# Patient Record
Sex: Male | Born: 1935 | Race: White | Hispanic: No | Smoking: Former smoker
Health system: Southern US, Community
[De-identification: ages and names within clinical notes are randomized; demographics above are authoritative.]

## PROBLEM LIST (undated history)

## (undated) DIAGNOSIS — K5730 Diverticulosis of large intestine without perforation or abscess without bleeding: Secondary | ICD-10-CM

## (undated) DIAGNOSIS — I10 Essential (primary) hypertension: Secondary | ICD-10-CM

## (undated) DIAGNOSIS — I2510 Atherosclerotic heart disease of native coronary artery without angina pectoris: Secondary | ICD-10-CM

## (undated) DIAGNOSIS — J189 Pneumonia, unspecified organism: Secondary | ICD-10-CM

## (undated) DIAGNOSIS — K529 Noninfective gastroenteritis and colitis, unspecified: Secondary | ICD-10-CM

## (undated) DIAGNOSIS — I350 Nonrheumatic aortic (valve) stenosis: Secondary | ICD-10-CM

## (undated) DIAGNOSIS — N289 Disorder of kidney and ureter, unspecified: Secondary | ICD-10-CM

## (undated) DIAGNOSIS — I509 Heart failure, unspecified: Secondary | ICD-10-CM

## (undated) DIAGNOSIS — I214 Non-ST elevation (NSTEMI) myocardial infarction: Secondary | ICD-10-CM

## (undated) DIAGNOSIS — I739 Peripheral vascular disease, unspecified: Secondary | ICD-10-CM

## (undated) DIAGNOSIS — I639 Cerebral infarction, unspecified: Secondary | ICD-10-CM

## (undated) DIAGNOSIS — N400 Enlarged prostate without lower urinary tract symptoms: Secondary | ICD-10-CM

## (undated) DIAGNOSIS — I6529 Occlusion and stenosis of unspecified carotid artery: Secondary | ICD-10-CM

## (undated) HISTORY — DX: Noninfective gastroenteritis and colitis, unspecified: K52.9

## (undated) HISTORY — PX: KNEE ARTHROSCOPY: SUR90

## (undated) HISTORY — PX: ELBOW SURGERY: SHX618

## (undated) HISTORY — DX: Diverticulosis of large intestine without perforation or abscess without bleeding: K57.30

## (undated) HISTORY — PX: APPENDECTOMY: SHX54

## (undated) HISTORY — PX: CATARACT EXTRACTION: SUR2

## (undated) HISTORY — DX: Pneumonia, unspecified organism: J18.9

## (undated) HISTORY — PX: HEMORRHOID SURGERY: SHX153

---

## 2001-07-15 ENCOUNTER — Encounter: Payer: Self-pay | Admitting: Emergency Medicine

## 2001-07-15 ENCOUNTER — Emergency Department (HOSPITAL_COMMUNITY): Admission: EM | Admit: 2001-07-15 | Discharge: 2001-07-15 | Payer: Self-pay | Admitting: Emergency Medicine

## 2001-10-18 ENCOUNTER — Emergency Department (HOSPITAL_COMMUNITY): Admission: EM | Admit: 2001-10-18 | Discharge: 2001-10-19 | Payer: Self-pay | Admitting: Pathology

## 2002-01-11 DIAGNOSIS — I2510 Atherosclerotic heart disease of native coronary artery without angina pectoris: Secondary | ICD-10-CM

## 2002-01-11 HISTORY — PX: CORONARY ARTERY BYPASS GRAFT: SHX141

## 2002-01-11 HISTORY — DX: Atherosclerotic heart disease of native coronary artery without angina pectoris: I25.10

## 2002-10-02 ENCOUNTER — Inpatient Hospital Stay (HOSPITAL_COMMUNITY): Admission: AD | Admit: 2002-10-02 | Discharge: 2002-10-10 | Payer: Self-pay | Admitting: *Deleted

## 2002-10-02 ENCOUNTER — Encounter: Payer: Self-pay | Admitting: *Deleted

## 2002-10-05 ENCOUNTER — Encounter: Payer: Self-pay | Admitting: Thoracic Surgery (Cardiothoracic Vascular Surgery)

## 2002-10-06 ENCOUNTER — Encounter: Payer: Self-pay | Admitting: Thoracic Surgery (Cardiothoracic Vascular Surgery)

## 2002-10-07 ENCOUNTER — Encounter: Payer: Self-pay | Admitting: Cardiothoracic Surgery

## 2002-10-08 ENCOUNTER — Encounter: Payer: Self-pay | Admitting: Cardiothoracic Surgery

## 2003-02-12 ENCOUNTER — Encounter: Admission: RE | Admit: 2003-02-12 | Discharge: 2003-02-12 | Payer: Self-pay | Admitting: Cardiology

## 2003-03-21 ENCOUNTER — Inpatient Hospital Stay (HOSPITAL_BASED_OUTPATIENT_CLINIC_OR_DEPARTMENT_OTHER): Admission: RE | Admit: 2003-03-21 | Discharge: 2003-03-21 | Payer: Self-pay | Admitting: Cardiology

## 2003-04-24 ENCOUNTER — Ambulatory Visit (HOSPITAL_COMMUNITY): Admission: RE | Admit: 2003-04-24 | Discharge: 2003-04-25 | Payer: Self-pay | Admitting: Cardiology

## 2003-07-26 ENCOUNTER — Ambulatory Visit (HOSPITAL_COMMUNITY): Admission: RE | Admit: 2003-07-26 | Discharge: 2003-07-27 | Payer: Self-pay | Admitting: Cardiology

## 2003-11-27 ENCOUNTER — Ambulatory Visit: Payer: Self-pay | Admitting: Internal Medicine

## 2003-12-03 ENCOUNTER — Ambulatory Visit: Payer: Self-pay | Admitting: Internal Medicine

## 2004-03-09 ENCOUNTER — Ambulatory Visit: Payer: Self-pay | Admitting: Internal Medicine

## 2004-03-20 ENCOUNTER — Ambulatory Visit: Payer: Self-pay | Admitting: Internal Medicine

## 2004-03-24 ENCOUNTER — Ambulatory Visit: Payer: Self-pay | Admitting: Cardiology

## 2004-06-29 ENCOUNTER — Ambulatory Visit: Payer: Self-pay | Admitting: Internal Medicine

## 2004-06-30 ENCOUNTER — Ambulatory Visit: Payer: Self-pay | Admitting: Internal Medicine

## 2004-07-06 ENCOUNTER — Emergency Department (HOSPITAL_COMMUNITY): Admission: EM | Admit: 2004-07-06 | Discharge: 2004-07-06 | Payer: Self-pay | Admitting: Emergency Medicine

## 2004-07-06 ENCOUNTER — Inpatient Hospital Stay (HOSPITAL_COMMUNITY): Admission: AD | Admit: 2004-07-06 | Discharge: 2004-07-09 | Payer: Self-pay | Admitting: Neurology

## 2004-07-07 ENCOUNTER — Encounter (INDEPENDENT_AMBULATORY_CARE_PROVIDER_SITE_OTHER): Payer: Self-pay | Admitting: Cardiology

## 2004-08-11 ENCOUNTER — Encounter (INDEPENDENT_AMBULATORY_CARE_PROVIDER_SITE_OTHER): Payer: Self-pay | Admitting: *Deleted

## 2004-08-11 ENCOUNTER — Inpatient Hospital Stay (HOSPITAL_COMMUNITY): Admission: RE | Admit: 2004-08-11 | Discharge: 2004-08-12 | Payer: Self-pay | Admitting: *Deleted

## 2004-08-11 HISTORY — PX: CAROTID ENDARTERECTOMY: SUR193

## 2004-09-21 ENCOUNTER — Emergency Department (HOSPITAL_COMMUNITY): Admission: EM | Admit: 2004-09-21 | Discharge: 2004-09-21 | Payer: Self-pay | Admitting: Emergency Medicine

## 2004-10-08 ENCOUNTER — Inpatient Hospital Stay (HOSPITAL_COMMUNITY): Admission: RE | Admit: 2004-10-08 | Discharge: 2004-10-10 | Payer: Self-pay | Admitting: *Deleted

## 2004-10-08 ENCOUNTER — Encounter (INDEPENDENT_AMBULATORY_CARE_PROVIDER_SITE_OTHER): Payer: Self-pay | Admitting: Specialist

## 2004-10-08 HISTORY — PX: CAROTID ENDARTERECTOMY: SUR193

## 2004-10-12 ENCOUNTER — Ambulatory Visit: Payer: Self-pay | Admitting: Cardiology

## 2004-10-29 ENCOUNTER — Ambulatory Visit: Payer: Self-pay | Admitting: Internal Medicine

## 2004-11-13 ENCOUNTER — Ambulatory Visit: Payer: Self-pay | Admitting: Internal Medicine

## 2004-11-17 ENCOUNTER — Encounter: Admission: RE | Admit: 2004-11-17 | Discharge: 2004-12-09 | Payer: Self-pay | Admitting: Internal Medicine

## 2004-11-23 ENCOUNTER — Ambulatory Visit: Payer: Self-pay | Admitting: Internal Medicine

## 2004-12-16 ENCOUNTER — Ambulatory Visit: Payer: Self-pay | Admitting: Internal Medicine

## 2004-12-25 ENCOUNTER — Ambulatory Visit: Payer: Self-pay | Admitting: Internal Medicine

## 2005-01-29 ENCOUNTER — Ambulatory Visit: Payer: Self-pay | Admitting: Internal Medicine

## 2005-04-12 ENCOUNTER — Ambulatory Visit: Payer: Self-pay | Admitting: Cardiology

## 2005-04-30 ENCOUNTER — Ambulatory Visit: Payer: Self-pay | Admitting: Internal Medicine

## 2005-07-30 ENCOUNTER — Ambulatory Visit: Payer: Self-pay | Admitting: Internal Medicine

## 2005-10-14 ENCOUNTER — Ambulatory Visit: Payer: Self-pay | Admitting: Cardiology

## 2005-11-10 ENCOUNTER — Ambulatory Visit: Payer: Self-pay | Admitting: Internal Medicine

## 2005-11-10 LAB — CONVERTED CEMR LAB
ALT: 25 units/L (ref 0–40)
AST: 27 units/L (ref 0–37)
Albumin: 3.8 g/dL (ref 3.5–5.2)
Alkaline Phosphatase: 76 units/L (ref 39–117)
BUN: 30 mg/dL — ABNORMAL HIGH (ref 6–23)
Bilirubin, Direct: 0.1 mg/dL (ref 0.0–0.3)
CO2: 28 meq/L (ref 19–32)
Calcium: 9.1 mg/dL (ref 8.4–10.5)
Chloride: 106 meq/L (ref 96–112)
Chol/HDL Ratio, serum: 4.4
Cholesterol: 128 mg/dL (ref 0–200)
Creatinine, Ser: 1.7 mg/dL — ABNORMAL HIGH (ref 0.4–1.5)
GFR calc non Af Amer: 42 mL/min
Glomerular Filtration Rate, Af Am: 50 mL/min/{1.73_m2}
Glucose, Bld: 101 mg/dL — ABNORMAL HIGH (ref 70–99)
HDL: 29.1 mg/dL — ABNORMAL LOW (ref >39.0)
LDL Cholesterol: 84 mg/dL (ref 0–99)
Potassium: 4 meq/L (ref 3.5–5.1)
Sodium: 141 meq/L (ref 135–145)
Total Bilirubin: 0.9 mg/dL (ref 0.3–1.2)
Total Protein: 6.7 g/dL (ref 6.0–8.3)
Triglyceride fasting, serum: 73 mg/dL (ref 0–149)
VLDL: 15 mg/dL (ref 0–40)

## 2005-11-17 ENCOUNTER — Ambulatory Visit: Payer: Self-pay | Admitting: Internal Medicine

## 2006-02-16 ENCOUNTER — Ambulatory Visit: Payer: Self-pay | Admitting: Internal Medicine

## 2006-02-16 LAB — CONVERTED CEMR LAB
ALT: 17 U/L
AST: 22 U/L
Albumin: 3.8 g/dL
Alkaline Phosphatase: 65 U/L
Bilirubin, Direct: 0.2 mg/dL
Total Bilirubin: 0.6 mg/dL
Total Protein: 6.6 g/dL

## 2006-06-15 ENCOUNTER — Ambulatory Visit: Payer: Self-pay | Admitting: Internal Medicine

## 2006-06-15 LAB — CONVERTED CEMR LAB
ALT: 23 units/L (ref 0–40)
AST: 23 units/L (ref 0–37)
Albumin: 4.1 g/dL (ref 3.5–5.2)
Alkaline Phosphatase: 76 units/L (ref 39–117)
BUN: 35 mg/dL — ABNORMAL HIGH (ref 6–23)
Bilirubin, Direct: 0.3 mg/dL (ref 0.0–0.3)
CO2: 28 meq/L (ref 19–32)
Calcium: 9.2 mg/dL (ref 8.4–10.5)
Chloride: 107 meq/L (ref 96–112)
Cholesterol: 129 mg/dL (ref 0–200)
Creatinine, Ser: 2 mg/dL — ABNORMAL HIGH (ref 0.4–1.5)
GFR calc Af Amer: 42 mL/min
GFR calc non Af Amer: 34 mL/min
Glucose, Bld: 83 mg/dL (ref 70–99)
HDL: 30.2 mg/dL — ABNORMAL LOW (ref >39.0)
LDL Cholesterol: 75 mg/dL (ref 0–99)
Potassium: 3.9 meq/L (ref 3.5–5.1)
Sodium: 142 meq/L (ref 135–145)
Total Bilirubin: 1.5 mg/dL — ABNORMAL HIGH (ref 0.3–1.2)
Total CHOL/HDL Ratio: 4.3
Total Protein: 7.4 g/dL (ref 6.0–8.3)
Triglycerides: 120 mg/dL (ref 0–149)
VLDL: 24 mg/dL (ref 0–40)

## 2006-06-17 DIAGNOSIS — K5730 Diverticulosis of large intestine without perforation or abscess without bleeding: Secondary | ICD-10-CM | POA: Insufficient documentation

## 2006-06-17 DIAGNOSIS — I6529 Occlusion and stenosis of unspecified carotid artery: Secondary | ICD-10-CM

## 2006-06-17 DIAGNOSIS — E785 Hyperlipidemia, unspecified: Secondary | ICD-10-CM | POA: Insufficient documentation

## 2006-06-17 DIAGNOSIS — I10 Essential (primary) hypertension: Secondary | ICD-10-CM | POA: Insufficient documentation

## 2006-06-17 HISTORY — DX: Diverticulosis of large intestine without perforation or abscess without bleeding: K57.30

## 2006-06-22 ENCOUNTER — Ambulatory Visit: Payer: Self-pay | Admitting: Cardiology

## 2006-07-20 ENCOUNTER — Ambulatory Visit: Payer: Self-pay

## 2006-07-20 ENCOUNTER — Ambulatory Visit: Payer: Self-pay | Admitting: Cardiology

## 2006-07-20 LAB — CONVERTED CEMR LAB
CO2: 25 meq/L (ref 19–32)
GFR calc Af Amer: 58 mL/min
GFR calc non Af Amer: 48 mL/min
Potassium: 4 meq/L (ref 3.5–5.1)

## 2006-08-09 ENCOUNTER — Telehealth: Payer: Self-pay | Admitting: Internal Medicine

## 2006-09-02 ENCOUNTER — Encounter: Payer: Self-pay | Admitting: Internal Medicine

## 2006-10-03 ENCOUNTER — Ambulatory Visit: Payer: Self-pay | Admitting: Internal Medicine

## 2006-11-08 ENCOUNTER — Encounter: Payer: Self-pay | Admitting: Internal Medicine

## 2006-11-10 ENCOUNTER — Encounter: Payer: Self-pay | Admitting: Internal Medicine

## 2006-11-11 ENCOUNTER — Ambulatory Visit: Payer: Self-pay | Admitting: Internal Medicine

## 2006-11-14 ENCOUNTER — Telehealth: Payer: Self-pay | Admitting: Internal Medicine

## 2006-12-06 ENCOUNTER — Encounter: Payer: Self-pay | Admitting: Internal Medicine

## 2006-12-07 ENCOUNTER — Ambulatory Visit: Payer: Self-pay | Admitting: Internal Medicine

## 2006-12-07 LAB — CONVERTED CEMR LAB
ALT: 24 units/L (ref 0–53)
AST: 19 units/L (ref 0–37)
Albumin: 4 g/dL (ref 3.5–5.2)
Alkaline Phosphatase: 70 units/L (ref 39–117)
BUN: 34 mg/dL — ABNORMAL HIGH (ref 6–23)
Basophils Absolute: 0.1 10*3/uL (ref 0.0–0.1)
Basophils Relative: 1.3 % — ABNORMAL HIGH (ref 0.0–1.0)
Bilirubin, Direct: 0.2 mg/dL (ref 0.0–0.3)
CO2: 25 meq/L (ref 19–32)
Calcium: 9.2 mg/dL (ref 8.4–10.5)
Chloride: 106 meq/L (ref 96–112)
Cholesterol: 119 mg/dL (ref 0–200)
Creatinine, Ser: 2.2 mg/dL — ABNORMAL HIGH (ref 0.4–1.5)
Eosinophils Absolute: 0.4 10*3/uL (ref 0.0–0.6)
Eosinophils Relative: 5.6 % — ABNORMAL HIGH (ref 0.0–5.0)
GFR calc Af Amer: 37 mL/min
GFR calc non Af Amer: 31 mL/min
Glucose, Bld: 105 mg/dL — ABNORMAL HIGH (ref 70–99)
HCT: 39.1 % (ref 39.0–52.0)
HDL: 26 mg/dL — ABNORMAL LOW (ref >39.0)
Hemoglobin: 13.6 g/dL (ref 13.0–17.0)
LDL Cholesterol: 72 mg/dL (ref 0–99)
Lymphocytes Relative: 24.2 % (ref 12.0–46.0)
MCHC: 34.7 g/dL (ref 30.0–36.0)
MCV: 92.1 fL (ref 78.0–100.0)
Monocytes Absolute: 0.7 10*3/uL (ref 0.2–0.7)
Monocytes Relative: 9 % (ref 3.0–11.0)
Neutro Abs: 4.9 10*3/uL (ref 1.4–7.7)
Neutrophils Relative %: 59.9 % (ref 43.0–77.0)
Platelets: 186 10*3/uL (ref 150–400)
Potassium: 4.5 meq/L (ref 3.5–5.1)
RBC: 4.24 M/uL (ref 4.22–5.81)
RDW: 13.1 % (ref 11.5–14.6)
Sodium: 140 meq/L (ref 135–145)
Total Bilirubin: 1 mg/dL (ref 0.3–1.2)
Total CHOL/HDL Ratio: 4.6
Total Protein: 6.9 g/dL (ref 6.0–8.3)
Triglycerides: 107 mg/dL (ref 0–149)
VLDL: 21 mg/dL (ref 0–40)
WBC: 8 10*3/uL (ref 4.5–10.5)

## 2006-12-14 ENCOUNTER — Ambulatory Visit: Payer: Self-pay | Admitting: Internal Medicine

## 2007-01-17 ENCOUNTER — Encounter: Payer: Self-pay | Admitting: Internal Medicine

## 2007-01-17 ENCOUNTER — Ambulatory Visit: Payer: Self-pay

## 2007-02-02 ENCOUNTER — Encounter: Payer: Self-pay | Admitting: Internal Medicine

## 2007-04-14 ENCOUNTER — Ambulatory Visit: Payer: Self-pay | Admitting: Internal Medicine

## 2007-04-14 LAB — CONVERTED CEMR LAB
ALT: 21 units/L (ref 0–53)
BUN: 30 mg/dL — ABNORMAL HIGH (ref 6–23)
CO2: 26 meq/L (ref 19–32)
Calcium: 8.8 mg/dL (ref 8.4–10.5)
Creatinine, Ser: 1.5 mg/dL (ref 0.4–1.5)
GFR calc Af Amer: 58 mL/min
GFR calc non Af Amer: 48 mL/min
Glucose, Bld: 103 mg/dL — ABNORMAL HIGH (ref 70–99)
LDL Cholesterol: 76 mg/dL (ref 0–99)
Potassium: 4.6 meq/L (ref 3.5–5.1)
Total Bilirubin: 0.8 mg/dL (ref 0.3–1.2)
VLDL: 19 mg/dL (ref 0–40)

## 2007-04-24 ENCOUNTER — Ambulatory Visit: Payer: Self-pay | Admitting: Internal Medicine

## 2007-06-08 ENCOUNTER — Ambulatory Visit: Payer: Self-pay | Admitting: *Deleted

## 2007-06-08 ENCOUNTER — Encounter: Payer: Self-pay | Admitting: Internal Medicine

## 2007-06-21 ENCOUNTER — Ambulatory Visit: Payer: Self-pay | Admitting: Internal Medicine

## 2007-07-24 ENCOUNTER — Ambulatory Visit: Payer: Self-pay | Admitting: Internal Medicine

## 2007-07-25 ENCOUNTER — Ambulatory Visit: Payer: Self-pay | Admitting: Internal Medicine

## 2007-07-26 ENCOUNTER — Ambulatory Visit: Payer: Self-pay | Admitting: Internal Medicine

## 2007-07-28 ENCOUNTER — Emergency Department (HOSPITAL_COMMUNITY): Admission: EM | Admit: 2007-07-28 | Discharge: 2007-07-28 | Payer: Self-pay | Admitting: Emergency Medicine

## 2007-07-28 ENCOUNTER — Ambulatory Visit: Payer: Self-pay | Admitting: Internal Medicine

## 2007-08-08 ENCOUNTER — Telehealth: Payer: Self-pay | Admitting: Internal Medicine

## 2007-08-09 ENCOUNTER — Ambulatory Visit: Payer: Self-pay | Admitting: Internal Medicine

## 2007-08-14 ENCOUNTER — Ambulatory Visit: Payer: Self-pay | Admitting: Internal Medicine

## 2007-08-14 LAB — CONVERTED CEMR LAB
AST: 44 units/L — ABNORMAL HIGH (ref 0–37)
Alkaline Phosphatase: 66 units/L (ref 39–117)
Cholesterol: 105 mg/dL (ref 0–200)
HDL: 23.2 mg/dL — ABNORMAL LOW (ref >39.0)
Total CHOL/HDL Ratio: 4.5
Total Protein: 6.9 g/dL (ref 6.0–8.3)
Triglycerides: 94 mg/dL (ref 0–149)

## 2007-09-06 ENCOUNTER — Ambulatory Visit: Payer: Self-pay | Admitting: Internal Medicine

## 2007-09-08 ENCOUNTER — Encounter: Admission: RE | Admit: 2007-09-08 | Discharge: 2007-10-11 | Payer: Self-pay | Admitting: Internal Medicine

## 2007-11-02 ENCOUNTER — Ambulatory Visit: Payer: Self-pay | Admitting: Internal Medicine

## 2008-01-17 ENCOUNTER — Ambulatory Visit: Payer: Self-pay

## 2008-01-17 ENCOUNTER — Encounter: Payer: Self-pay | Admitting: Internal Medicine

## 2008-01-23 ENCOUNTER — Ambulatory Visit: Payer: Self-pay | Admitting: Internal Medicine

## 2008-01-23 LAB — CONVERTED CEMR LAB
BUN: 37 mg/dL — ABNORMAL HIGH (ref 6–23)
Basophils Absolute: 0.1 10*3/uL (ref 0.0–0.1)
Basophils Relative: 1.3 % (ref 0.0–3.0)
CO2: 27 meq/L (ref 19–32)
Chloride: 109 meq/L (ref 96–112)
Creatinine, Ser: 2 mg/dL — ABNORMAL HIGH (ref 0.4–1.5)
Eosinophils Absolute: 0.5 10*3/uL (ref 0.0–0.7)
Eosinophils Relative: 7.3 % — ABNORMAL HIGH (ref 0.0–5.0)
GFR calc Af Amer: 41 mL/min
Glucose, Bld: 95 mg/dL (ref 70–99)
LDL Cholesterol: 72 mg/dL (ref 0–99)
Lymphocytes Relative: 27.7 % (ref 12.0–46.0)
MCHC: 34.5 g/dL (ref 30.0–36.0)
Monocytes Relative: 9.4 % (ref 3.0–12.0)
Neutro Abs: 3.6 10*3/uL (ref 1.4–7.7)
Platelets: 168 10*3/uL (ref 150–400)
Potassium: 4.9 meq/L (ref 3.5–5.1)
RBC: 3.98 M/uL — ABNORMAL LOW (ref 4.22–5.81)
RDW: 12.6 % (ref 11.5–14.6)
Sodium: 142 meq/L (ref 135–145)
Total Bilirubin: 0.8 mg/dL (ref 0.3–1.2)
Total Protein: 7.8 g/dL (ref 6.0–8.3)
WBC: 6.7 10*3/uL (ref 4.5–10.5)

## 2008-01-30 ENCOUNTER — Ambulatory Visit: Payer: Self-pay | Admitting: Internal Medicine

## 2008-01-30 DIAGNOSIS — I2510 Atherosclerotic heart disease of native coronary artery without angina pectoris: Secondary | ICD-10-CM

## 2008-01-30 DIAGNOSIS — D649 Anemia, unspecified: Secondary | ICD-10-CM

## 2008-02-20 ENCOUNTER — Ambulatory Visit: Payer: Self-pay | Admitting: Internal Medicine

## 2008-05-27 ENCOUNTER — Ambulatory Visit: Payer: Self-pay | Admitting: Internal Medicine

## 2008-05-28 LAB — CONVERTED CEMR LAB
ALT: 23 units/L (ref 0–53)
Albumin: 3.9 g/dL (ref 3.5–5.2)
Alkaline Phosphatase: 71 units/L (ref 39–117)
Basophils Absolute: 0 10*3/uL (ref 0.0–0.1)
Basophils Relative: 0.2 % (ref 0.0–3.0)
Calcium: 8.8 mg/dL (ref 8.4–10.5)
Chloride: 110 meq/L (ref 96–112)
Eosinophils Absolute: 0.5 10*3/uL (ref 0.0–0.7)
Eosinophils Relative: 6.6 % — ABNORMAL HIGH (ref 0.0–5.0)
Glucose, Bld: 78 mg/dL (ref 70–99)
HCT: 39.5 % (ref 39.0–52.0)
Hemoglobin: 13.3 g/dL (ref 13.0–17.0)
Lymphocytes Relative: 31.7 % (ref 12.0–46.0)
Lymphs Abs: 2.2 10*3/uL (ref 0.7–4.0)
MCV: 96 fL (ref 78.0–100.0)
Monocytes Absolute: 0.6 10*3/uL (ref 0.1–1.0)
Neutrophils Relative %: 52.3 % (ref 43.0–77.0)
Platelets: 160 10*3/uL (ref 150.0–400.0)
RBC: 4.11 M/uL — ABNORMAL LOW (ref 4.22–5.81)
RDW: 12.6 % (ref 11.5–14.6)
Sodium: 142 meq/L (ref 135–145)

## 2008-07-08 ENCOUNTER — Telehealth: Payer: Self-pay | Admitting: Internal Medicine

## 2008-07-22 ENCOUNTER — Ambulatory Visit: Payer: Self-pay | Admitting: Internal Medicine

## 2008-07-23 ENCOUNTER — Ambulatory Visit: Payer: Self-pay | Admitting: Internal Medicine

## 2008-09-11 ENCOUNTER — Ambulatory Visit: Payer: Self-pay | Admitting: Internal Medicine

## 2008-09-19 ENCOUNTER — Encounter: Admission: RE | Admit: 2008-09-19 | Discharge: 2008-09-19 | Payer: Self-pay | Admitting: Internal Medicine

## 2008-10-09 ENCOUNTER — Ambulatory Visit: Payer: Self-pay | Admitting: Internal Medicine

## 2008-12-20 ENCOUNTER — Encounter: Payer: Self-pay | Admitting: Internal Medicine

## 2008-12-20 ENCOUNTER — Ambulatory Visit: Payer: Self-pay | Admitting: Internal Medicine

## 2008-12-24 ENCOUNTER — Ambulatory Visit: Payer: Self-pay | Admitting: Internal Medicine

## 2008-12-25 DIAGNOSIS — N183 Chronic kidney disease, stage 3 unspecified: Secondary | ICD-10-CM | POA: Insufficient documentation

## 2008-12-25 DIAGNOSIS — N179 Acute kidney failure, unspecified: Secondary | ICD-10-CM

## 2008-12-25 LAB — CONVERTED CEMR LAB
ALT: 30 units/L (ref 0–53)
Albumin: 3.2 g/dL — ABNORMAL LOW (ref 3.5–5.2)
CO2: 21 meq/L (ref 19–32)
Calcium: 8.1 mg/dL — ABNORMAL LOW (ref 8.4–10.5)
Chloride: 109 meq/L (ref 96–112)
LDL Cholesterol: 48 mg/dL (ref 0–99)
Sodium: 140 meq/L (ref 135–145)
Total CHOL/HDL Ratio: 3
Total Protein: 6.7 g/dL (ref 6.0–8.3)

## 2008-12-27 ENCOUNTER — Encounter: Admission: RE | Admit: 2008-12-27 | Discharge: 2008-12-27 | Payer: Self-pay | Admitting: Internal Medicine

## 2008-12-31 ENCOUNTER — Ambulatory Visit: Payer: Self-pay | Admitting: Internal Medicine

## 2008-12-31 DIAGNOSIS — M109 Gout, unspecified: Secondary | ICD-10-CM | POA: Insufficient documentation

## 2009-01-07 LAB — CONVERTED CEMR LAB
Calcium: 8.6 mg/dL (ref 8.4–10.5)
Chloride: 105 meq/L (ref 96–112)
Creatinine, Ser: 2.1 mg/dL — ABNORMAL HIGH (ref 0.4–1.5)
Glucose, Bld: 108 mg/dL — ABNORMAL HIGH (ref 70–99)
Sodium: 140 meq/L (ref 135–145)

## 2009-01-08 ENCOUNTER — Ambulatory Visit: Payer: Self-pay | Admitting: Internal Medicine

## 2009-01-16 ENCOUNTER — Ambulatory Visit: Payer: Self-pay | Admitting: Internal Medicine

## 2009-01-17 LAB — CONVERTED CEMR LAB
CO2: 30 meq/L (ref 19–32)
Chloride: 104 meq/L (ref 96–112)
Creatinine, Ser: 1.7 mg/dL — ABNORMAL HIGH (ref 0.4–1.5)
GFR calc non Af Amer: 41.13 mL/min (ref >60)
Glucose, Bld: 90 mg/dL (ref 70–99)
Potassium: 4.3 meq/L (ref 3.5–5.1)
Sodium: 140 meq/L (ref 135–145)

## 2009-01-20 ENCOUNTER — Telehealth: Payer: Self-pay | Admitting: Internal Medicine

## 2009-01-27 ENCOUNTER — Ambulatory Visit: Payer: Self-pay | Admitting: Internal Medicine

## 2009-02-06 ENCOUNTER — Ambulatory Visit: Payer: Self-pay | Admitting: Internal Medicine

## 2009-02-21 ENCOUNTER — Ambulatory Visit: Payer: Self-pay | Admitting: Internal Medicine

## 2009-03-03 ENCOUNTER — Ambulatory Visit: Payer: Self-pay | Admitting: Internal Medicine

## 2009-03-31 ENCOUNTER — Ambulatory Visit: Payer: Self-pay | Admitting: Internal Medicine

## 2009-04-03 ENCOUNTER — Ambulatory Visit: Payer: Self-pay | Admitting: Internal Medicine

## 2009-04-09 ENCOUNTER — Telehealth: Payer: Self-pay | Admitting: Internal Medicine

## 2009-05-16 ENCOUNTER — Ambulatory Visit: Payer: Self-pay | Admitting: Internal Medicine

## 2009-06-06 ENCOUNTER — Ambulatory Visit: Payer: Self-pay | Admitting: Vascular Surgery

## 2009-06-06 ENCOUNTER — Encounter: Payer: Self-pay | Admitting: Internal Medicine

## 2009-08-18 ENCOUNTER — Telehealth: Payer: Self-pay | Admitting: Internal Medicine

## 2009-08-20 ENCOUNTER — Ambulatory Visit: Payer: Self-pay | Admitting: Internal Medicine

## 2009-08-20 DIAGNOSIS — M549 Dorsalgia, unspecified: Secondary | ICD-10-CM | POA: Insufficient documentation

## 2009-09-17 ENCOUNTER — Ambulatory Visit: Payer: Self-pay | Admitting: Internal Medicine

## 2009-09-19 LAB — CONVERTED CEMR LAB
ALT: 22 units/L (ref 0–53)
AST: 28 units/L (ref 0–37)
Alkaline Phosphatase: 62 units/L (ref 39–117)
Basophils Relative: 0.6 % (ref 0.0–3.0)
Bilirubin, Direct: 0.2 mg/dL (ref 0.0–0.3)
CO2: 31 meq/L (ref 19–32)
Chloride: 102 meq/L (ref 96–112)
Cholesterol: 124 mg/dL (ref 0–200)
HCT: 42.3 % (ref 39.0–52.0)
Hemoglobin: 14.1 g/dL (ref 13.0–17.0)
LDL Cholesterol: 58 mg/dL (ref 0–99)
Lymphocytes Relative: 26.8 % (ref 12.0–46.0)
MCV: 98.1 fL (ref 78.0–100.0)
Monocytes Absolute: 0.8 10*3/uL (ref 0.1–1.0)
Monocytes Relative: 8.9 % (ref 3.0–12.0)
Neutrophils Relative %: 59.5 % (ref 43.0–77.0)
Potassium: 4.2 meq/L (ref 3.5–5.1)
RBC: 4.31 M/uL (ref 4.22–5.81)
TSH: 2.26 microintl units/mL (ref 0.35–5.50)
Total CHOL/HDL Ratio: 4
Total Protein: 6.8 g/dL (ref 6.0–8.3)
Triglycerides: 163 mg/dL — ABNORMAL HIGH (ref 0.0–149.0)
Uric Acid, Serum: 3.7 mg/dL — ABNORMAL LOW (ref 4.0–7.8)

## 2009-10-10 ENCOUNTER — Ambulatory Visit: Payer: Self-pay | Admitting: Internal Medicine

## 2009-10-10 ENCOUNTER — Emergency Department (HOSPITAL_COMMUNITY): Admission: EM | Admit: 2009-10-10 | Discharge: 2009-10-10 | Payer: Self-pay | Admitting: Emergency Medicine

## 2009-10-13 ENCOUNTER — Encounter: Admission: RE | Admit: 2009-10-13 | Discharge: 2009-10-13 | Payer: Self-pay | Admitting: Internal Medicine

## 2009-10-13 DIAGNOSIS — Z87448 Personal history of other diseases of urinary system: Secondary | ICD-10-CM | POA: Insufficient documentation

## 2009-11-06 ENCOUNTER — Encounter: Payer: Self-pay | Admitting: Internal Medicine

## 2009-11-07 ENCOUNTER — Ambulatory Visit: Payer: Self-pay | Admitting: Internal Medicine

## 2009-11-17 ENCOUNTER — Encounter
Admission: RE | Admit: 2009-11-17 | Discharge: 2009-12-19 | Payer: Self-pay | Source: Home / Self Care | Attending: Internal Medicine | Admitting: Internal Medicine

## 2009-11-18 ENCOUNTER — Encounter: Payer: Self-pay | Admitting: Internal Medicine

## 2009-11-19 ENCOUNTER — Telehealth: Payer: Self-pay | Admitting: Internal Medicine

## 2009-11-26 ENCOUNTER — Ambulatory Visit: Payer: Self-pay | Admitting: Internal Medicine

## 2009-11-26 DIAGNOSIS — G4700 Insomnia, unspecified: Secondary | ICD-10-CM | POA: Insufficient documentation

## 2009-11-27 ENCOUNTER — Telehealth: Payer: Self-pay | Admitting: Internal Medicine

## 2009-11-27 ENCOUNTER — Ambulatory Visit: Payer: Self-pay | Admitting: Vascular Surgery

## 2009-11-27 ENCOUNTER — Encounter: Payer: Self-pay | Admitting: Internal Medicine

## 2009-12-19 ENCOUNTER — Encounter: Payer: Self-pay | Admitting: Internal Medicine

## 2009-12-23 ENCOUNTER — Encounter: Payer: Self-pay | Admitting: Pulmonary Disease

## 2009-12-23 ENCOUNTER — Ambulatory Visit (HOSPITAL_BASED_OUTPATIENT_CLINIC_OR_DEPARTMENT_OTHER)
Admission: RE | Admit: 2009-12-23 | Discharge: 2009-12-23 | Payer: Self-pay | Source: Home / Self Care | Attending: Internal Medicine | Admitting: Internal Medicine

## 2010-02-10 NOTE — Assessment & Plan Note (Signed)
Summary: CHANGE OF MED/CJR   Vital Signs:  Patient profile:   77 year old male Weight:      193 pounds Temp:     97.8 degrees F oral BP sitting:   150 / 74  (left arm) Cuff size:   regular  Vitals Entered By: Sid Falcon LPN (October 10, 2009 10:59 AM)  History of Present Illness:  Follow-Up Visit      This is an 77 year old man who presents for Follow-up visit.  The patient denies chest pain and palpitations.  Since the last visit the patient notes no new problems or concerns---has continued back pain despite new medications and also has continued insomnia.  The patient reports taking meds as prescribed.  When questioned about possible medication side effects, the patient notes none.    All other systems reviewed and were negative   Allergies: 1)  ! Nsaids  Physical Exam  General:  alert and well-developed.   Abdomen:  soft and non-tender.   Msk:  No deformity or scoliosis noted of thoracic or lumbar spine.   Pulses:  R radial normal and L radial normal.   Neurologic:  cranial nerves II-XII intact and gait normal.   Skin:  turgor normal and color normal.     Impression & Recommendations:  Problem # 1:  BACK PAIN (ICD-724.5) meds not effective  will get xray and try gabapentin  side effects discussed I'm hopeful that gabapentin that will help insomnia The following medications were removed from the medication list:    Metaxalone 800 Mg Tabs (Metaxalone) .Marland Kitchen... Take 1 tab by mouth at bedtime  Orders: T-Lumbar Spine Complete, 5 Views ([id number])  Problem # 2:  insomnia  Complete Medication List: 1)  Furosemide 40 Mg Tabs (Furosemide) .... Take 1 tablet by mouth once a day 2)  Imdur 60 Mg Tb24 (Isosorbide mononitrate) .... Take 1 tablet by mouth once a day 3)  Lumigan 0.03 % Soln (Bimatoprost) .... Apply 4)  Nitrostat 0.4 Mg Subl (Nitroglycerin) .... Dissolve one under tongue as needed as directed 5)  Carvedilol 25 Mg Tabs (Carvedilol) .Marland Kitchen.. 1 pill by mouth 2 times  daily 6)  Vytorin 10-40 Mg Tabs (Ezetimibe-simvastatin) .... Take 1 tablet by mouth at bedtime 7)  Fluticasone Propionate 50 Mcg/act Susp (Fluticasone propionate) .... 2 sprays each nostril once daily 8)  Levobunolol Hcl 0.25 % Soln (Levobunolol hcl) .... Once daily 9)  Alphagan P 0.15 % Soln (Brimonidine tartrate) .... Two times a day 10)  Allopurinol 300 Mg Tabs (Allopurinol) .... Take one tab by mouth once daily 11)  Prednisone 5 Mg Tabs (Prednisone) .... Take 1/2 by mouth every other day 12)  Gabapentin 300 Mg Caps (Gabapentin) .Marland Kitchen.. 1 by mouth two times a day for 7 days and then 1 in the morning and 2 at night  Other Orders: Flu Vaccine 70yrs + MEDICARE PATIENTS (Y8657) Administration Flu vaccine - MCR (Q4696)  Patient Instructions: 1)  Please schedule a follow-up appointment in 1 month. Prescriptions: GABAPENTIN 300 MG  CAPS (GABAPENTIN) 1 by mouth two times a day for 7 days and then 1 in the morning and 2 at night  #90 x 11   Entered and Authorized by:   Birdie Sons MD   Signed by:   Birdie Sons MD on 10/10/2009   Method used:   Electronically to        [facility] [address] (retail)       [phone]  [place], [place]  [address]       Ph: [phone]       Fax: [phone]   RxID:   [id number]     Flu Vaccine Consent Questions     Do you have a history of severe allergic reactions to this vaccine? no    Any prior history of allergic reactions to egg and/or gelatin? no    Do you have a sensitivity to the preservative Thimersol? no    Do you have a past history of Guillan-Barre Syndrome? no    Do you currently have an acute febrile illness? no    Have you ever had a severe reaction to latex? no    Vaccine information given and explained to patient? yes    Are you currently pregnant? no    Lot Number:[id number]   Exp Date:07/11/2010   Site Given  Left Deltoid IMdflu

## 2010-02-10 NOTE — Assessment & Plan Note (Signed)
Summary: ROA/FUP/RCD   Vital Signs:  Patient profile:   77 year old male Weight:      186 pounds Temp:     98.2 degrees F Pulse rate:   68 / minute Pulse rhythm:   irregular Resp:     14 per minute BP sitting:   182 / 70  (left arm)  Vitals Entered By: Gladis Riffle, RN (January 16, 2009 8:40 AM)   History of Present Illness: Renal insufficiency---now off NSAIDS  gout---he feels much better  lipids tolerating meds without difficulty  HTN---note still on furosemide (may exacerbate gout)  Preventive Screening-Counseling & Management  Alcohol-Tobacco     Smoking Status: quit > 6 months     Year Started: 1948     Year Quit: 1990  Current Problems (verified): 1)  Gout  (ICD-274.9) 2)  Renal Failure  (ICD-586) 3)  Knee Pain, Right  (ICD-719.46) 4)  Anemia  (ICD-285.9) 5)  Coronary Artery Disease  (ICD-414.00) 6)  Carotid Artery Disease  (ICD-433.10) 7)  Hypertension  (ICD-401.9) 8)  Hyperlipidemia  (ICD-272.4) 9)  Diverticulosis, Colon  (ICD-562.10)  Current Medications (verified): 1)  Furosemide 40 Mg Tabs (Furosemide) .... Take 1 Tablet By Mouth Once A Day 2)  Imdur 60 Mg Tb24 (Isosorbide Mononitrate) .... Take 1 Tablet By Mouth Once A Day 3)  Lumigan 0.03 % Soln (Bimatoprost) .... Apply 4)  Nitrostat 0.4 Mg Subl (Nitroglycerin) .... Dissolve One Under Tongue As Needed As Directed 5)  Carvedilol 25 Mg  Tabs (Carvedilol) .Marland Kitchen.. 1 Pill By Mouth 2 Times Daily 6)  Vytorin 10-40 Mg  Tabs (Ezetimibe-Simvastatin) .... Take 1 Tablet By Mouth At Bedtime 7)  Fluticasone Propionate 50 Mcg/act  Susp (Fluticasone Propionate) .... 2 Sprays Each Nostril Once Daily 8)  No Otc Analgesics Except Acetaminofen .... As Needed 9)  Levobunolol Hcl 0.25 % Soln (Levobunolol Hcl) .... Once Daily 10)  Alphagan P 0.15 % Soln (Brimonidine Tartrate) .... Two Times A Day 11)  Allopurinol 300 Mg Tabs (Allopurinol) .... Take One Tab By Mouth Once Daily 12)  Prednisone 10 Mg  Tabs (Prednisone) .... 3 Po  Qd For 3 Days, Then 2 Po Qd For 3 Days, Then 1 Po Qd For 3 Days, Then 1/2 Po Qd For 3 Days  Allergies (verified): 1)  ! Nsaids  Comments:  Nurse/Medical Assistant: fo follow up  The patient's medications were reviewed with the patient's parent and were updated in the Medication and Allergy Lists. Gladis Riffle, RN (January 16, 2009 8:41 AM)  Past History:  Past Medical History: Last updated: 12/14/2006 Diverticulosis, colon Hyperlipidemia Hypertension Renal insufficiency glaucoma carotid vascular disease L subclavian stent cataract Coronary artery disease  Past Surgical History: Last updated: 12/14/2006 Appendectomy Coronary artery bypass graft Hemorrhoidectomy CEA knee arthroscopy knee elbow Cataract extraction X2  Family History: Last updated: 10/28/2006 mother deceased elderly father deceased 23 stroke  Social History: Last updated: 06/21/2007 Married Former Smoker Alcohol use-no Regular exercise-no one son deceased (major coronary)  Risk Factors: Exercise: no (Oct 28, 2006)  Risk Factors: Smoking Status: quit > 6 months (01/16/2009)  Review of Systems       All other systems reviewed and were negative   Physical Exam  General:  Well-developed,well-nourished,in no acute distress; alert,appropriate and cooperative throughout examination Head:  normocephalic and atraumatic.   Eyes:  pupils equal and pupils round.   Neck:  No deformities, masses, or tenderness noted. Chest Wall:  No deformities, masses, tenderness or gynecomastia noted. Lungs:  normal respiratory effort  and no intercostal retractions.   Heart:  normal rate and regular rhythm.   Abdomen:  soft and non-tender.   Msk:  no joint swelling or effusions Neurologic:  cranial nerves II-XII intact and gait normal.     Impression & Recommendations:  Problem # 1:  GOUT (ICD-274.9)  resolved tapering of prednisone His updated medication list for this problem includes:    Allopurinol 300  Mg Tabs (Allopurinol) .Marland Kitchen... Take one tab by mouth once daily  Orders: TLB-Uric Acid, Blood (84550-URIC)  Problem # 2:  RENAL FAILURE (ICD-586) off nsaids  Problem # 3:  CORONARY ARTERY DISEASE (ICD-414.00) no sxs His updated medication list for this problem includes:    Furosemide 40 Mg Tabs (Furosemide) .Marland Kitchen... Take 1 tablet by mouth once a day    Imdur 60 Mg Tb24 (Isosorbide mononitrate) .Marland Kitchen... Take 1 tablet by mouth once a day    Nitrostat 0.4 Mg Subl (Nitroglycerin) .Marland Kitchen... Dissolve one under tongue as needed as directed    Carvedilol 25 Mg Tabs (Carvedilol) .Marland Kitchen... 1 pill by mouth 2 times daily  Problem # 4:  HYPERLIPIDEMIA (ICD-272.4) previously controlled His updated medication list for this problem includes:    Vytorin 10-40 Mg Tabs (Ezetimibe-simvastatin) .Marland Kitchen... Take 1 tablet by mouth at bedtime  Labs Reviewed: SGOT: 24 (12/24/2008)   SGPT: 30 (12/24/2008)   HDL:28.30 (12/24/2008), 28.20 (05/27/2008)  LDL:48 (12/24/2008), 77 (05/27/2008)  Chol:92 (12/24/2008), 129 (05/27/2008)  Trig:79.0 (12/24/2008), 119.0 (05/27/2008)  Complete Medication List: 1)  Furosemide 40 Mg Tabs (Furosemide) .... Take 1 tablet by mouth once a day 2)  Imdur 60 Mg Tb24 (Isosorbide mononitrate) .... Take 1 tablet by mouth once a day 3)  Lumigan 0.03 % Soln (Bimatoprost) .... Apply 4)  Nitrostat 0.4 Mg Subl (Nitroglycerin) .... Dissolve one under tongue as needed as directed 5)  Carvedilol 25 Mg Tabs (Carvedilol) .Marland Kitchen.. 1 pill by mouth 2 times daily 6)  Vytorin 10-40 Mg Tabs (Ezetimibe-simvastatin) .... Take 1 tablet by mouth at bedtime 7)  Fluticasone Propionate 50 Mcg/act Susp (Fluticasone propionate) .... 2 sprays each nostril once daily 8)  No Otc Analgesics Except Acetaminofen  .... As needed 9)  Levobunolol Hcl 0.25 % Soln (Levobunolol hcl) .... Once daily 10)  Alphagan P 0.15 % Soln (Brimonidine tartrate) .... Two times a day 11)  Allopurinol 300 Mg Tabs (Allopurinol) .... Take one tab by mouth once  daily 12)  Prednisone 10 Mg Tabs (Prednisone) .... 3 po qd for 3 days, then 2 po qd for 3 days, then 1 po qd for 3 days, then 1/2 po qd for 3 days  Other Orders: Venipuncture ([address]) TLB-BMP (Basic Metabolic Panel-BMET) (80048-METABOL)  Patient Instructions: 1)  3-4 weeks

## 2010-02-10 NOTE — Consult Note (Signed)
Summary: [facility]  [facility]   Imported By: Maryln Gottron 11/21/2009 15:09:13  _____________________________________________________________________  External Attachment:    Type:   Image     Comment:   External Document

## 2010-02-10 NOTE — Assessment & Plan Note (Signed)
Summary: GOUT/PS   Vital Signs:  Patient profile:   77 year old male Temp:     97.8 degrees F Pulse rate:   60 / minute Resp:     14 per minute BP sitting:   144 / 76  (left arm)  Vitals Entered By: Gladis Riffle, RN (January 27, 2009 10:15 AM)   History of Present Illness: ?recurrent gout had recurrent sxs left hand---no sxs right hand but has some discomfort of both feet no fever or chills  tolerTing meds without difficulty  All other systems reviewed and were negative   Preventive Screening-Counseling & Management  Alcohol-Tobacco     Smoking Status: quit > 6 months     Year Started: 1948     Year Quit: 1990  Allergies: 1)  ! Nsaids  Comments:  Nurse/Medical Assistant: c/o gout since yesterday in both hands and feet  The patient's medications and allergies were reviewed with the patient and were updated in the Medication and Allergy Lists. Gladis Riffle, RN (January 27, 2009 10:16 AM)  Past History:  Past Medical History: Last updated: 12/14/2006 Diverticulosis, colon Hyperlipidemia Hypertension Renal insufficiency glaucoma carotid vascular disease L subclavian stent cataract Coronary artery disease  Past Surgical History: Last updated: 12/14/2006 Appendectomy Coronary artery bypass graft Hemorrhoidectomy CEA knee arthroscopy knee elbow Cataract extraction X2  Family History: Last updated: 2006/10/05 mother deceased elderly father deceased 16 stroke  Social History: Last updated: 06/21/2007 Married Former Smoker Alcohol use-no Regular exercise-no one son deceased (major coronary)  Risk Factors: Exercise: no (10-05-06)  Risk Factors: Smoking Status: quit > 6 months (01/27/2009)  Physical Exam  General:  Well-developed,well-nourished,in no acute distress; alert,appropriate and cooperative throughout examination Head:  normocephalic and atraumatic.   Msk:  swelling of second mcp joint ...right   Impression &  Recommendations:  Problem # 1:  GOUT (ICD-274.9) short course prednisone, conrinue prednisone see me 2 weeks His updated medication list for this problem includes:    Allopurinol 300 Mg Tabs (Allopurinol) .Marland Kitchen... Take one tab by mouth once daily  Complete Medication List: 1)  Furosemide 40 Mg Tabs (Furosemide) .... Take 1 tablet by mouth once a day 2)  Imdur 60 Mg Tb24 (Isosorbide mononitrate) .... Take 1 tablet by mouth once a day 3)  Lumigan 0.03 % Soln (Bimatoprost) .... Apply 4)  Nitrostat 0.4 Mg Subl (Nitroglycerin) .... Dissolve one under tongue as needed as directed 5)  Carvedilol 25 Mg Tabs (Carvedilol) .Marland Kitchen.. 1 pill by mouth 2 times daily 6)  Vytorin 10-40 Mg Tabs (Ezetimibe-simvastatin) .... Take 1 tablet by mouth at bedtime 7)  Fluticasone Propionate 50 Mcg/act Susp (Fluticasone propionate) .... 2 sprays each nostril once daily 8)  Levobunolol Hcl 0.25 % Soln (Levobunolol hcl) .... Once daily 9)  Alphagan P 0.15 % Soln (Brimonidine tartrate) .... Two times a day 10)  Allopurinol 300 Mg Tabs (Allopurinol) .... Take one tab by mouth once daily 11)  Prednisone 20 Mg Tabs (Prednisone) .... Take 1 tablet by mouth once a day Prescriptions: PREDNISONE 20 MG TABS (PREDNISONE) Take 1 tablet by mouth once a day  #5 x 1   Entered and Authorized by:   Birdie Sons MD   Signed by:   Birdie Sons MD on 01/27/2009   Method used:   Electronically to        [facility] [address] (retail)       [address]       [facility],  [place]  [address]       Ph: [phone]       Fax: [phone]   RxID:   [id number]

## 2010-02-10 NOTE — Assessment & Plan Note (Signed)
Summary: 1 month rov/njr   Vital Signs:  Patient profile:   77 year old male Height:      73 inches (185.42 cm) Weight:      194.50 pounds (88.41 kg) Temp:     97.7 degrees F (36.50 degrees C) oral BP sitting:   160 / 70  (left arm) Cuff size:   regular  Vitals Entered By: Lucious Groves CMA (September 17, 2009 9:29 AM) CC: 1 mo rtn ov./kb Is Patient Diabetic? No Pain Assessment Patient in pain? no      Comments Patient states that no refills are needed at this time./kb   CC:  1 mo rtn ov./kb.  History of Present Illness: GOUT---no recurrence, tolerating prednisone  lipids---tolerating meds  htn---tolerating meds---no home BPs  All other systems reviewed and were negative   Current Problems (verified): 1)  Back Pain  (ICD-724.5) 2)  Gout  (ICD-274.9) 3)  Renal Failure  (ICD-586) 4)  Anemia  (ICD-285.9) 5)  Coronary Artery Disease  (ICD-414.00) 6)  Carotid Artery Disease  (ICD-433.10) 7)  Hypertension  (ICD-401.9) 8)  Hyperlipidemia  (ICD-272.4) 9)  Diverticulosis, Colon  (ICD-562.10)  Current Medications (verified): 1)  Furosemide 40 Mg Tabs (Furosemide) .... Take 1 Tablet By Mouth Once A Day 2)  Imdur 60 Mg Tb24 (Isosorbide Mononitrate) .... Take 1 Tablet By Mouth Once A Day 3)  Lumigan 0.03 % Soln (Bimatoprost) .... Apply 4)  Nitrostat 0.4 Mg Subl (Nitroglycerin) .... Dissolve One Under Tongue As Needed As Directed 5)  Carvedilol 25 Mg  Tabs (Carvedilol) .Marland Kitchen.. 1 Pill By Mouth 2 Times Daily 6)  Vytorin 10-40 Mg  Tabs (Ezetimibe-Simvastatin) .... Take 1 Tablet By Mouth At Bedtime 7)  Fluticasone Propionate 50 Mcg/act  Susp (Fluticasone Propionate) .... 2 Sprays Each Nostril Once Daily 8)  Levobunolol Hcl 0.25 % Soln (Levobunolol Hcl) .... Once Daily 9)  Alphagan P 0.15 % Soln (Brimonidine Tartrate) .... Two Times A Day 10)  Allopurinol 300 Mg Tabs (Allopurinol) .... Take One Tab By Mouth Once Daily 11)  Prednisone 5 Mg Tabs (Prednisone) .... Take 1 Tablet By Mouth  Once A Day For One Week and Then 1/2 By Mouth Once Daily 12)  Doxepin Hcl 10 Mg Caps (Doxepin Hcl) .... Take 1 Tab By Mouth At Bedtime As Needed Insomnia 13)  Cyclobenzaprine Hcl 10 Mg  Tabs (Cyclobenzaprine Hcl) .... Take 1 Tab By Mouth At Bedtime  Allergies (verified): 1)  ! Nsaids  Past History:  Past Medical History: Last updated: 12/14/2006 Diverticulosis, colon Hyperlipidemia Hypertension Renal insufficiency glaucoma carotid vascular disease L subclavian stent cataract Coronary artery disease  Past Surgical History: Last updated: 12/14/2006 Appendectomy Coronary artery bypass graft Hemorrhoidectomy CEA knee arthroscopy knee elbow Cataract extraction X2  Family History: Last updated: 2006/10/12 mother deceased elderly father deceased 72 stroke  Social History: Last updated: 06/21/2007 Married Former Smoker Alcohol use-no Regular exercise-no one son deceased (major coronary)  Risk Factors: Exercise: no (12-Oct-2006)  Risk Factors: Smoking Status: quit > 6 months (08/20/2009)  Physical Exam  General:  alert and well-developed.   Head:  normocephalic and atraumatic.   Eyes:  pupils equal and pupils round.   Ears:  R ear normal and L ear normal.   Neck:  No deformities, masses, or tenderness noted. Chest Wall:  no deformities and no tenderness.   Lungs:  normal respiratory effort and no intercostal retractions.   Heart:  normal rate and regular rhythm.  2/6 hsm Abdomen:  soft and non-tender.  Skin:  turgor normal and color normal.   Psych:  normally interactive and good eye contact.     Impression & Recommendations:  Problem # 1:  BACK PAIN (ICD-724.5) chronic problem sxs not controlled by current meds will change to a different muscle relaxer His updated medication list for this problem includes:    Cyclobenzaprine Hcl 10 Mg Tabs (Cyclobenzaprine hcl) .Marland Kitchen... Take 1 tab by mouth at bedtime  Problem # 2:  GOUT (ICD-274.9) no recurrence low  dose prednisone---decrease to every other day  His updated medication list for this problem includes:    Allopurinol 300 Mg Tabs (Allopurinol) .Marland Kitchen... Take one tab by mouth once daily  Orders: Venipuncture ([address]) TLB-Uric Acid, Blood (84550-URIC)  Problem # 3:  CORONARY ARTERY DISEASE (ICD-414.00) no sxs continue current medications  His updated medication list for this problem includes:    Furosemide 40 Mg Tabs (Furosemide) .Marland Kitchen... Take 1 tablet by mouth once a day    Imdur 60 Mg Tb24 (Isosorbide mononitrate) .Marland Kitchen... Take 1 tablet by mouth once a day    Nitrostat 0.4 Mg Subl (Nitroglycerin) .Marland Kitchen... Dissolve one under tongue as needed as directed    Carvedilol 25 Mg Tabs (Carvedilol) .Marland Kitchen... 1 pill by mouth 2 times daily  Problem # 4:  ANEMIA (ICD-285.9) check labs today Orders: TLB-CBC Platelet - w/Differential (85025-CBCD)  Complete Medication List: 1)  Furosemide 40 Mg Tabs (Furosemide) .... Take 1 tablet by mouth once a day 2)  Imdur 60 Mg Tb24 (Isosorbide mononitrate) .... Take 1 tablet by mouth once a day 3)  Lumigan 0.03 % Soln (Bimatoprost) .... Apply 4)  Nitrostat 0.4 Mg Subl (Nitroglycerin) .... Dissolve one under tongue as needed as directed 5)  Carvedilol 25 Mg Tabs (Carvedilol) .Marland Kitchen.. 1 pill by mouth 2 times daily 6)  Vytorin 10-40 Mg Tabs (Ezetimibe-simvastatin) .... Take 1 tablet by mouth at bedtime 7)  Fluticasone Propionate 50 Mcg/act Susp (Fluticasone propionate) .... 2 sprays each nostril once daily 8)  Levobunolol Hcl 0.25 % Soln (Levobunolol hcl) .... Once daily 9)  Alphagan P 0.15 % Soln (Brimonidine tartrate) .... Two times a day 10)  Allopurinol 300 Mg Tabs (Allopurinol) .... Take one tab by mouth once daily 11)  Prednisone 5 Mg Tabs (Prednisone) .... Take 1 tablet by mouth once a day for one week and then 1/2 by mouth once daily 12)  Doxepin Hcl 10 Mg Caps (Doxepin hcl) .... Take 1 tab by mouth at bedtime as needed insomnia 13)  Cyclobenzaprine Hcl 10 Mg Tabs  (Cyclobenzaprine hcl) .... Take 1 tab by mouth at bedtime  Other Orders: TLB-Renal Function Panel (80069-RENAL) TLB-Lipid Panel (80061-LIPID) TLB-Hepatic/Liver Function Pnl (80076-HEPATIC) TLB-TSH (Thyroid Stimulating Hormone) (95621-HYQ)  Patient Instructions: 1)  Please schedule a follow-up appointment in 4 months.  Appended Document: 1 month rov/njr repeat BP 138/76  Appended Document: 1 month rov/njr

## 2010-02-10 NOTE — Progress Notes (Signed)
Summary: cough  Phone Note Call from Patient   Caller: Patient Call For: Birdie Sons MD Summary of Call: Pt is complaining of productive cough (white).  Would like cough RX and chest is sore. [facility] ([facility] Pt given lab results. Initial call taken by: Lynann Beaver CMA,  January 20, 2009 8:58 AM  Follow-up for Phone Call        mucinex dm two times a day for 7 days Follow-up by: Birdie Sons MD,  January 20, 2009 10:23 AM    New/Updated Medications: MUCINEX DM 30-600 MG XR12H-TAB (DEXTROMETHORPHAN-GUAIFENESIN) one by mouth two times a day x 7 days Prescriptions: MUCINEX DM 30-600 MG XR12H-TAB (DEXTROMETHORPHAN-GUAIFENESIN) one by mouth two times a day x 7 days  #14 x 0   Entered by:   Lynann Beaver CMA   Authorized by:   Birdie Sons MD   Signed by:   Lynann Beaver CMA on 01/20/2009   Method used:   [facility]ronically to        [facility]       [address]       [place], [place]  [address]       Ph: [phone]       Fax: [phone]   RxID:   [id number]

## 2010-02-10 NOTE — Assessment & Plan Note (Signed)
Summary: shoulder inj//ccm   Vital Signs:  Patient profile:   77 year old male BP sitting:   130 / 78  (left arm) Cuff size:   regular  Vitals Entered By: Kern Reap CMA Duncan Dull) (April 03, 2009 7:54 AM)  Procedure Note Last Tetanus: Td (07/28/2007)  Injections: Indication: chronic pain  Procedure # 1: joint injection    Location: right shoulder    Medication: 40 mg depomedrol    Anesthesia: 1.0 ml 1% lidocaine w/o epinephrine    Comment: verbal consent  CC: injection - shoulder   CC:  injection - shoulder.  Allergies: 1)  ! Nsaids   Complete Medication List: 1)  Furosemide 40 Mg Tabs (Furosemide) .... Take 1 tablet by mouth once a day 2)  Imdur 60 Mg Tb24 (Isosorbide mononitrate) .... Take 1 tablet by mouth once a day 3)  Lumigan 0.03 % Soln (Bimatoprost) .... Apply 4)  Nitrostat 0.4 Mg Subl (Nitroglycerin) .... Dissolve one under tongue as needed as directed 5)  Carvedilol 25 Mg Tabs (Carvedilol) .Marland Kitchen.. 1 pill by mouth 2 times daily 6)  Vytorin 10-40 Mg Tabs (Ezetimibe-simvastatin) .... Take 1 tablet by mouth at bedtime 7)  Fluticasone Propionate 50 Mcg/act Susp (Fluticasone propionate) .... 2 sprays each nostril once daily 8)  Levobunolol Hcl 0.25 % Soln (Levobunolol hcl) .... Once daily 9)  Alphagan P 0.15 % Soln (Brimonidine tartrate) .... Two times a day 10)  Allopurinol 300 Mg Tabs (Allopurinol) .... Take one tab by mouth once daily 11)  Prednisone 5 Mg Tabs (Prednisone) .Marland Kitchen.. 1 and 1/2  by mouth once daily or as directed 12)  Trazodone Hcl 50 Mg Tabs (Trazodone hcl) .... 1/2-1 by mouth at bedtime as needed insomnia  Other Orders: Joint Aspirate / Injection, Large ([address]) Depo- Medrol 40mg  (J1030)

## 2010-02-10 NOTE — Progress Notes (Signed)
Summary: sleeping pill not working  Phone Note Call from Patient Call back at [employer] [phone]   Caller: Patient Summary of Call: Trazodone is not working for pt.  Would like to change meds. [facility]. [phone] Called again.  He nor pharmacy has heard.  Raelene Bott Spell, RN  April 10, 2009 11:21 AM  Initial call taken by: Lynann Beaver CMA,  April 09, 2009 8:22 AM  Follow-up for Phone Call        dc trazodone change to doxepin, see meds Follow-up by: Birdie Sons MD,  April 11, 2009 8:31 AM    New/Updated Medications: DOXEPIN HCL 10 MG CAPS (DOXEPIN HCL) Take 1 tab by mouth at bedtime as needed insomnia Prescriptions: DOXEPIN HCL 10 MG CAPS (DOXEPIN HCL) Take 1 tab by mouth at bedtime as needed insomnia  #30 x 3   Entered and Authorized by:   Birdie Sons MD   Signed by:   Birdie Sons MD on 04/11/2009   Method used:   Electronically to        [facility] (retail)       [address]       [place], [place]  [address]       Ph: [phone]       Fax: [phone]   RxID:   [id number]  Pt. notified.

## 2010-02-10 NOTE — Progress Notes (Signed)
Summary: REQ FOR RX  Phone Note Refill Request Message from:  Patient   [phone] on November 27, 2009 11:52 AM  Refills Requested: Medication #1:  LORAZEPAM 0.5 MG TABS 1/2-1 by mouth at bedtime as needed.   Notes: [facility].  Pt adv that he wasn't given a written Rx for med, adv it was supposed to be sent to [facility].... Pt states that if it was given to him then he threw it away mistakenly because he doesn't have it.... would like Rx sent to  [facility] [address].   Initial call taken by: Debbra Riding,  November 27, 2009 11:54 AM  Follow-up for Phone Call        [facility].  Patient notified by voice message. Follow-up by: Gladis Riffle, RN,  November 27, 2009 2:08 PM

## 2010-02-10 NOTE — Assessment & Plan Note (Signed)
Summary: 2 wk rov/mm   Vital Signs:  Patient profile:   77 year old male Weight:      185 pounds Temp:     98.2 degrees F Pulse rate:   62 / minute Resp:     12 per minute BP sitting:   152 / 70  (left arm)  Vitals Entered By: Gladis Riffle, RN (March 03, 2009 8:18 AM) CC: 2 week rov Is Patient Diabetic? No   CC:  2 week rov.  History of Present Illness: Gout---no recurrence on slow prednisone taper also on allopurinol feeling well (as good as I have in years) no joint swelling no joint erythema  All other systems reviewed and were negative    Preventive Screening-Counseling & Management  Alcohol-Tobacco     Smoking Status: quit > 6 months     Year Started: 1950     Year Quit: 1990  Current Problems (verified): 1)  Gout  (ICD-274.9) 2)  Renal Failure  (ICD-586) 3)  Anemia  (ICD-285.9) 4)  Coronary Artery Disease  (ICD-414.00) 5)  Carotid Artery Disease  (ICD-433.10) 6)  Hypertension  (ICD-401.9) 7)  Hyperlipidemia  (ICD-272.4) 8)  Diverticulosis, Colon  (ICD-562.10)  Medications Prior to Update: 1)  Furosemide 40 Mg Tabs (Furosemide) .... Take 1 Tablet By Mouth Once A Day 2)  Imdur 60 Mg Tb24 (Isosorbide Mononitrate) .... Take 1 Tablet By Mouth Once A Day 3)  Lumigan 0.03 % Soln (Bimatoprost) .... Apply 4)  Nitrostat 0.4 Mg Subl (Nitroglycerin) .... Dissolve One Under Tongue As Needed As Directed 5)  Carvedilol 25 Mg  Tabs (Carvedilol) .Marland Kitchen.. 1 Pill By Mouth 2 Times Daily 6)  Vytorin 10-40 Mg  Tabs (Ezetimibe-Simvastatin) .... Take 1 Tablet By Mouth At Bedtime 7)  Fluticasone Propionate 50 Mcg/act  Susp (Fluticasone Propionate) .... 2 Sprays Each Nostril Once Daily 8)  Levobunolol Hcl 0.25 % Soln (Levobunolol Hcl) .... Once Daily 9)  Alphagan P 0.15 % Soln (Brimonidine Tartrate) .... Two Times A Day 10)  Allopurinol 300 Mg Tabs (Allopurinol) .... Take One Tab By Mouth Once Daily 11)  Prednisone 5 Mg Tabs (Prednisone) .... 3 By Mouth Once Daily or As  Directed  Current Medications (verified): 1)  Furosemide 40 Mg Tabs (Furosemide) .... Take 1 Tablet By Mouth Once A Day 2)  Imdur 60 Mg Tb24 (Isosorbide Mononitrate) .... Take 1 Tablet By Mouth Once A Day 3)  Lumigan 0.03 % Soln (Bimatoprost) .... Apply 4)  Nitrostat 0.4 Mg Subl (Nitroglycerin) .... Dissolve One Under Tongue As Needed As Directed 5)  Carvedilol 25 Mg  Tabs (Carvedilol) .Marland Kitchen.. 1 Pill By Mouth 2 Times Daily 6)  Vytorin 10-40 Mg  Tabs (Ezetimibe-Simvastatin) .... Take 1 Tablet By Mouth At Bedtime 7)  Fluticasone Propionate 50 Mcg/act  Susp (Fluticasone Propionate) .... 2 Sprays Each Nostril Once Daily 8)  Levobunolol Hcl 0.25 % Soln (Levobunolol Hcl) .... Once Daily 9)  Alphagan P 0.15 % Soln (Brimonidine Tartrate) .... Two Times A Day 10)  Allopurinol 300 Mg Tabs (Allopurinol) .... Take One Tab By Mouth Once Daily 11)  Prednisone 5 Mg Tabs (Prednisone) .... 3 By Mouth Once Daily or As Directed  Allergies: 1)  ! Nsaids  Past History:  Past Medical History: Last updated: 12/14/2006 Diverticulosis, colon Hyperlipidemia Hypertension Renal insufficiency glaucoma carotid vascular disease L subclavian stent cataract Coronary artery disease  Past Surgical History: Last updated: 12/14/2006 Appendectomy Coronary artery bypass graft Hemorrhoidectomy CEA knee arthroscopy knee elbow Cataract extraction X2  Family History:  Last updated: 10/03/2006 mother deceased elderly father deceased 55 stroke  Social History: Last updated: 06/21/2007 Married Former Smoker Alcohol use-no Regular exercise-no one son deceased (major coronary)  Risk Factors: Exercise: no (10/03/2006)  Risk Factors: Smoking Status: quit > 6 months (03/03/2009)  Physical Exam  General:  Well-developed,well-nourished,in no acute distress; alert,appropriate and cooperative throughout examination Head:  normocephalic and atraumatic.   Eyes:  pupils equal and pupils round.   Ears:  R ear  normal and L ear normal.   Neck:  No deformities, masses, or tenderness noted. Chest Wall:  no deformities and no tenderness.   Lungs:  normal respiratory effort and no intercostal retractions.   Heart:  normal rate and regular rhythm.   Abdomen:  soft and non-tender.   Msk:  No deformity or scoliosis noted of thoracic or lumbar spine.   Neurologic:  cranial nerves II-XII intact and gait normal.     Impression & Recommendations:  Problem # 1:  GOUT (ICD-274.9) Clinically resolved will change to prednisone 10 mg I'll see back in 3-4 weeks side effects discussed His updated medication list for this problem includes:    Allopurinol 300 Mg Tabs (Allopurinol) .Marland Kitchen... Take one tab by mouth once daily  Complete Medication List: 1)  Furosemide 40 Mg Tabs (Furosemide) .... Take 1 tablet by mouth once a day 2)  Imdur 60 Mg Tb24 (Isosorbide mononitrate) .... Take 1 tablet by mouth once a day 3)  Lumigan 0.03 % Soln (Bimatoprost) .... Apply 4)  Nitrostat 0.4 Mg Subl (Nitroglycerin) .... Dissolve one under tongue as needed as directed 5)  Carvedilol 25 Mg Tabs (Carvedilol) .Marland Kitchen.. 1 pill by mouth 2 times daily 6)  Vytorin 10-40 Mg Tabs (Ezetimibe-simvastatin) .... Take 1 tablet by mouth at bedtime 7)  Fluticasone Propionate 50 Mcg/act Susp (Fluticasone propionate) .... 2 sprays each nostril once daily 8)  Levobunolol Hcl 0.25 % Soln (Levobunolol hcl) .... Once daily 9)  Alphagan P 0.15 % Soln (Brimonidine tartrate) .... Two times a day 10)  Allopurinol 300 Mg Tabs (Allopurinol) .... Take one tab by mouth once daily 11)  Prednisone 5 Mg Tabs (Prednisone) .... 2 by mouth once daily or as directed  Patient Instructions: 1)  3-4 weeks

## 2010-02-10 NOTE — Assessment & Plan Note (Signed)
Summary: Not sleeping/cb   Vital Signs:  Patient profile:   77 year old male Weight:      186 pounds Temp:     98.3 degrees F oral Pulse rate:   58 / minute BP sitting:   122 / 64  (left arm) Cuff size:   regular  Vitals Entered By: Alfred Levins, CMA (November 26, 2009 10:14 AM) CC: Imipramine not working, he has slept 1hr over the last 16 days   CC:  Imipramine not working and he has slept 1hr over the last 16 days.  History of Present Illness: pt reports sleeping one hour in the past 16 days- states he does not sleep during the day and can't fall asleep at night.  I have reviewed chart. Reviewed multiple medications which the patient is transversely. Patient states that none of these have helped. Patient states that his mind wanders when he tries to sleep. Patient denies daytime sleepiness. He does admit to feeling tired but is unable to sleep.  Review of systems. Patient admits to chronic fatigue, insomnia. Denies chest pain, shortness breath, PND, orthopnea. His appetite is normal bowel movements are normal. No other complaints in a complete review of systems.  Current Medications (verified): 1)  Furosemide 40 Mg Tabs (Furosemide) .... Take 1 Tablet By Mouth Once A Day 2)  Imdur 60 Mg Tb24 (Isosorbide Mononitrate) .... Take 1 Tablet By Mouth Once A Day 3)  Lumigan 0.03 % Soln (Bimatoprost) .... Apply 4)  Nitrostat 0.4 Mg Subl (Nitroglycerin) .... Dissolve One Under Tongue As Needed As Directed 5)  Carvedilol 25 Mg  Tabs (Carvedilol) .Marland Kitchen.. 1 Pill By Mouth 2 Times Daily 6)  Vytorin 10-40 Mg  Tabs (Ezetimibe-Simvastatin) .... Take 1 Tablet By Mouth At Bedtime 7)  Fluticasone Propionate 50 Mcg/act  Susp (Fluticasone Propionate) .... 2 Sprays Each Nostril Once Daily 8)  Levobunolol Hcl 0.25 % Soln (Levobunolol Hcl) .... Once Daily 9)  Alphagan P 0.15 % Soln (Brimonidine Tartrate) .... Two Times A Day 10)  Allopurinol 300 Mg Tabs (Allopurinol) .... Take One Tab By Mouth  Once Daily 11)  Prednisone 5 Mg Tabs (Prednisone) .... Take 1/2 By Mouth Every Other Day 12)  Imipramine Hcl 25 Mg Tabs (Imipramine Hcl) .... 1/2 -1 By Mouth At Bedtime As Needed For Sleep  Allergies (verified): 1)  ! Nsaids   Past History:  Past Medical History: Last updated: 12/14/2006 Diverticulosis, colon Hyperlipidemia Hypertension Renal insufficiency glaucoma carotid vascular disease L subclavian stent cataract Coronary artery disease  Past Surgical History: Last updated: 12/14/2006 Appendectomy Coronary artery bypass graft Hemorrhoidectomy CEA knee arthroscopy knee elbow Cataract extraction X2  Family History: Last updated: 10/22/06 mother deceased elderly father deceased 57 stroke  Social History: Last updated: 06/21/2007 Married Former Smoker Alcohol use-no Regular exercise-no one son deceased (major coronary)  Risk Factors: Exercise: no (10-22-06)  Risk Factors: Smoking Status: quit > 6 months (08/20/2009)  Physical Exam  General:  elderly male in no acute distress. HEENT exam atraumatic, normocephalic symmetric her muscles are intact her neck is supple. Chest clear to auscultation cardiac exam S1-S2 are regular. Abdominal exam active bowel sounds, soft. Extremities there is no clubbing cyanosis or edema. Neurologic exam he is alert gait is normal.   Impression & Recommendations:  Problem # 1:  INSOMNIA-SLEEP DISORDER-UNSPEC (ICD-780.52)  patient is troubled with insomnia for years. Unable to find any medical treatment for his problem. I think it's impossible for him to be sleeping as little as he says.  I think he will be best served by referral to a sleep specialist. Side effects of medications discussed.  Orders: Sleep Disorder Referral (Sleep Disorder)  Complete Medication List: 1)  Furosemide 40 Mg Tabs (Furosemide) .... Take 1 tablet by mouth once a day 2)  Imdur 60 Mg Tb24 (Isosorbide mononitrate) .... Take 1 tablet by mouth  once a day 3)  Lumigan 0.03 % Soln (Bimatoprost) .... Apply 4)  Nitrostat 0.4 Mg Subl (Nitroglycerin) .... Dissolve one under tongue as needed as directed 5)  Carvedilol 25 Mg Tabs (Carvedilol) .Marland Kitchen.. 1 pill by mouth 2 times daily 6)  Vytorin 10-40 Mg Tabs (Ezetimibe-simvastatin) .... Take 1 tablet by mouth at bedtime 7)  Fluticasone Propionate 50 Mcg/act Susp (Fluticasone propionate) .... 2 sprays each nostril once daily 8)  Levobunolol Hcl 0.25 % Soln (Levobunolol hcl) .... Once daily 9)  Alphagan P 0.15 % Soln (Brimonidine tartrate) .... Two times a day 10)  Allopurinol 300 Mg Tabs (Allopurinol) .... Take one tab by mouth once daily 11)  Prednisone 5 Mg Tabs (Prednisone) .... Take 1/2 by mouth every other day 12)  Lorazepam 0.5 Mg Tabs (Lorazepam) .... 1/2-1 by mouth at bedtime as needed  Patient Instructions: 1)  Please schedule a follow-up appointment in 3 months. Prescriptions: LORAZEPAM 0.5 MG TABS (LORAZEPAM) 1/2-1 by mouth at bedtime as needed  #15 x 0   Entered and Authorized by:   Birdie Sons MD   Signed by:   Birdie Sons MD on 11/26/2009   Method used:   Print then Give to Patient   RxID:   [id number]    Orders Added: 1)  Est. Patient Level III [06269] 2)  Sleep Disorder Referral [Sleep Disorder]

## 2010-02-10 NOTE — Assessment & Plan Note (Signed)
Summary: 4 WK ROV/NJR//pt rescd//ccm   Vital Signs:  Patient profile:   77 year old male Height:      73 inches Weight:      192 pounds BMI:     25.42 Temp:     98.2 degrees F oral Pulse rate:   58 / minute Pulse rhythm:   irregular BP sitting:   128 / 68  (left arm) Cuff size:   regular  Vitals Entered By: Kern Reap CMA Duncan Dull) (March 31, 2009 9:44 AM) CC: follow-up visit Is Patient Diabetic? No   CC:  follow-up visit.  History of Present Illness:  Follow-Up Visit      This is an 77 year old man who presents for Follow-up visit.  The patient denies chest pain and palpitations.  Since the last visit the patient notes no new problems or concerns.  The patient reports taking meds as prescribed.  When questioned about possible medication side effects, the patient notes none.   gout has not recurred  new problems: says he can't sleep. difficulty with sleep latency and frequent awakenings  also: shoulder pain.Marland Kitchenthis has been a recurrent problem. was on higher doses of prednisone: initial relief  All other systems reviewed and were negative   Current Problems (verified): 1)  Gout  (ICD-274.9) 2)  Renal Failure  (ICD-586) 3)  Anemia  (ICD-285.9) 4)  Coronary Artery Disease  (ICD-414.00) 5)  Carotid Artery Disease  (ICD-433.10) 6)  Hypertension  (ICD-401.9) 7)  Hyperlipidemia  (ICD-272.4) 8)  Diverticulosis, Colon  (ICD-562.10)  Current Medications (verified): 1)  Furosemide 40 Mg Tabs (Furosemide) .... Take 1 Tablet By Mouth Once A Day 2)  Imdur 60 Mg Tb24 (Isosorbide Mononitrate) .... Take 1 Tablet By Mouth Once A Day 3)  Lumigan 0.03 % Soln (Bimatoprost) .... Apply 4)  Nitrostat 0.4 Mg Subl (Nitroglycerin) .... Dissolve One Under Tongue As Needed As Directed 5)  Carvedilol 25 Mg  Tabs (Carvedilol) .Marland Kitchen.. 1 Pill By Mouth 2 Times Daily 6)  Vytorin 10-40 Mg  Tabs (Ezetimibe-Simvastatin) .... Take 1 Tablet By Mouth At Bedtime 7)  Fluticasone Propionate 50 Mcg/act  Susp  (Fluticasone Propionate) .... 2 Sprays Each Nostril Once Daily 8)  Levobunolol Hcl 0.25 % Soln (Levobunolol Hcl) .... Once Daily 9)  Alphagan P 0.15 % Soln (Brimonidine Tartrate) .... Two Times A Day 10)  Allopurinol 300 Mg Tabs (Allopurinol) .... Take One Tab By Mouth Once Daily 11)  Prednisone 5 Mg Tabs (Prednisone) .... 2 By Mouth Once Daily or As Directed  Allergies (verified): 1)  ! Nsaids  Past History:  Past Medical History: Last updated: 12/14/2006 Diverticulosis, colon Hyperlipidemia Hypertension Renal insufficiency glaucoma carotid vascular disease L subclavian stent cataract Coronary artery disease  Past Surgical History: Last updated: 12/14/2006 Appendectomy Coronary artery bypass graft Hemorrhoidectomy CEA knee arthroscopy knee elbow Cataract extraction X2  Family History: Last updated: 10-26-06 mother deceased elderly father deceased 54 stroke  Social History: Last updated: 06/21/2007 Married Former Smoker Alcohol use-no Regular exercise-no one son deceased (major coronary)  Risk Factors: Exercise: no (26-Oct-2006)  Risk Factors: Smoking Status: quit > 6 months (03/03/2009)  Physical Exam  General:  Well-developed,well-nourished,in no acute distress; alert,appropriate and cooperative throughout examination Head:  normocephalic and atraumatic.   Eyes:  pupils equal and pupils round.   Neck:  No deformities, masses, or tenderness noted. Chest Wall:  no deformities and no tenderness.   Lungs:  normal respiratory effort and no intercostal retractions.   Abdomen:  soft and non-tender.  Msk:  No deformity or scoliosis noted of thoracic or lumbar spine.   no joint inflammation right shoulder wit pain at 50 degrees of abduction Neurologic:  cranial nerves II-XII intact and gait normal.   Skin:  turgor normal and color normal.   Psych:  good eye contact and not anxious appearing.     Impression & Recommendations:  Problem # 1:  GOUT  (ICD-274.9) no recurrence His updated medication list for this problem includes:    Allopurinol 300 Mg Tabs (Allopurinol) .Marland Kitchen... Take one tab by mouth once daily  Problem # 2:  RENAL FAILURE (ICD-586) has been stable Labs Reviewed: BUN: 28 (01/16/2009)   Cr: 1.7 (01/16/2009)    Hgb: 13.3 (05/27/2008)   Hct: 39.5 (05/27/2008)   Ca++: 8.7 (01/16/2009)    TP: 6.7 (12/24/2008)   Alb: 3.2 (12/24/2008)  Problem # 3:  ANEMIA (ICD-285.9)  Hgb: 13.3 (05/27/2008)   Hct: 39.5 (05/27/2008)   Platelets: 160.0 (05/27/2008) RBC: 4.11 (05/27/2008)   RDW: 12.6 (05/27/2008)   WBC: 6.9 (05/27/2008) MCV: 96.0 (05/27/2008)   MCHC: 33.8 (05/27/2008)  Problem # 4:  HYPERTENSION (ICD-401.9) well controlled His updated medication list for this problem includes:    Furosemide 40 Mg Tabs (Furosemide) .Marland Kitchen... Take 1 tablet by mouth once a day    Carvedilol 25 Mg Tabs (Carvedilol) .Marland Kitchen... 1 pill by mouth 2 times daily  BP today: 128/68 Prior BP: 152/70 (03/03/2009)  Labs Reviewed: K+: 4.3 (01/16/2009) Creat: : 1.7 (01/16/2009)   Chol: 92 (12/24/2008)   HDL: 28.30 (12/24/2008)   LDL: 48 (12/24/2008)   TG: 79.0 (12/24/2008)  Problem # 5:  HYPERLIPIDEMIA (ICD-272.4)  His updated medication list for this problem includes:    Vytorin 10-40 Mg Tabs (Ezetimibe-simvastatin) .Marland Kitchen... 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 Take 1 tablet by mouth once a day 2)  Imdur 60 Mg Tb24 (Isosorbide mononitrate) .... Take 1 tablet by mouth once a day 3)  Lumigan  0.03 % Soln (Bimatoprost) .... Apply 4)  Nitrostat 0.4 Mg Subl (Nitroglycerin) .... Dissolve one under tongue as needed as directed 5)  Carvedilol 25 Mg Tabs (Carvedilol) .Marland Kitchen.. 1 pill by mouth 2 times daily 6)  Vytorin 10-40 Mg Tabs (Ezetimibe-simvastatin) .... Take 1 tablet by mouth at bedtime 7)  Fluticasone Propionate 50 Mcg/act Susp (Fluticasone propionate) .... 2 sprays each nostril once daily 8)  Levobunolol Hcl 0.25 % Soln (Levobunolol hcl) .... Once daily 9)  Alphagan P 0.15 % Soln (Brimonidine tartrate) .... Two times a day 10)  Allopurinol 300 Mg Tabs (Allopurinol) .... Take one tab by mouth once daily 11)  Prednisone 5 Mg Tabs (Prednisone) .Marland Kitchen.. 1 and 1/2  by mouth once daily or as directed 12)  Trazodone Hcl 50 Mg Tabs (Trazodone hcl) .... 1/2-1 by mouth at bedtime as needed insomnia  Patient Instructions: 1)  schedule shoulder injection Prescriptions: TRAZODONE HCL 50 MG TABS (TRAZODONE HCL) 1/2-1 by mouth at bedtime as needed insomnia  #30 x 1   Entered and Authorized by:   Birdie Sons MD   Signed by:   Birdie Sons MD on 03/31/2009  Method used:   Electronically to        [facility]       [address]       [place], [place]  [address]       Ph: [phone]       Fax: [phone]   RxID:   [id number]

## 2010-02-10 NOTE — Assessment & Plan Note (Signed)
Summary: ROA/FUP/RCD   Vital Signs:  Patient profile:   77 year old male Weight:      174 pounds Temp:     98 degrees F Pulse rate:   54 / minute Resp:     12 per minute BP sitting:   116 / 88  (left arm)  Vitals Entered By: Gladis Riffle, RN (February 06, 2009 8:10 AM)   History of Present Illness: GOUT---much better with prednisone--short course sxs recurred off prednisone tolerating allopurinol symptomatic now both MTP foints no fever or chills   All other systems reviewed and were negative   Preventive Screening-Counseling & Management  Alcohol-Tobacco     Smoking Status: quit > 6 months     Year Started: 1950     Year Quit: 1990  Current Problems (verified): 1)  Gout  (ICD-274.9) 2)  Renal Failure  (ICD-586) 3)  Knee Pain, Right  (ICD-719.46) 4)  Anemia  (ICD-285.9) 5)  Coronary Artery Disease  (ICD-414.00) 6)  Carotid Artery Disease  (ICD-433.10) 7)  Hypertension  (ICD-401.9) 8)  Hyperlipidemia  (ICD-272.4) 9)  Diverticulosis, Colon  (ICD-562.10)  Current Medications (verified): 1)  Furosemide 40 Mg Tabs (Furosemide) .... Take 1 Tablet By Mouth Once A Day 2)  Imdur 60 Mg Tb24 (Isosorbide Mononitrate) .... Take 1 Tablet By Mouth Once A Day 3)  Lumigan 0.03 % Soln (Bimatoprost) .... Apply 4)  Nitrostat 0.4 Mg Subl (Nitroglycerin) .... Dissolve One Under Tongue As Needed As Directed 5)  Carvedilol 25 Mg  Tabs (Carvedilol) .Marland Kitchen.. 1 Pill By Mouth 2 Times Daily 6)  Vytorin 10-40 Mg  Tabs (Ezetimibe-Simvastatin) .... Take 1 Tablet By Mouth At Bedtime 7)  Fluticasone Propionate 50 Mcg/act  Susp (Fluticasone Propionate) .... 2 Sprays Each Nostril Once Daily 8)  Levobunolol Hcl 0.25 % Soln (Levobunolol Hcl) .... Once Daily 9)  Alphagan P 0.15 % Soln (Brimonidine Tartrate) .... Two Times A Day 10)  Allopurinol 300 Mg Tabs (Allopurinol) .... Take One Tab By Mouth Once Daily  Allergies: 1)  ! Nsaids  Comments:  Nurse/Medical Assistant: FU, states had relief from  prednisone, but now pain has returned and includes both shoulders and right elbow  The patient's medications and allergies were reviewed with the patient and were updated in the Medication and Allergy Lists. Gladis Riffle, RN (February 06, 2009 8:11 AM)  Past History:  Past Medical History: Last updated: 12/14/2006 Diverticulosis, colon Hyperlipidemia Hypertension Renal insufficiency glaucoma carotid vascular disease L subclavian stent cataract Coronary artery disease  Past Surgical History: Last updated: 12/14/2006 Appendectomy Coronary artery bypass graft Hemorrhoidectomy CEA knee arthroscopy knee elbow Cataract extraction X2  Family History: Last updated: 2006/10/17 mother deceased elderly father deceased 67 stroke  Social History: Last updated: 06/21/2007 Married Former Smoker Alcohol use-no Regular exercise-no one son deceased (major coronary)  Risk Factors: Exercise: no (10/17/2006)  Risk Factors: Smoking Status: quit > 6 months (02/06/2009)  Review of Systems       All other systems reviewed and were negative   Physical Exam  General:  Well-developed,well-nourished,in no acute distress; alert,appropriate and cooperative throughout examination Head:  normocephalic and atraumatic.   Eyes:  pupils equal and pupils round.   Msk:  bilateral MTP joint swelling   Impression & Recommendations:  Problem # 1:  GOUT (ICD-274.9)  prednisone---taper start at 20 mg daily see me 2 weeks side effects discussed His updated medication list for this problem includes:    Allopurinol 300 Mg Tabs (Allopurinol) .Marland Kitchen... Take one tab by mouth  once daily  Orders: Prescription Created Electronically [phone])  Complete Medication List: 1)  Furosemide 40 Mg Tabs (Furosemide) .... Take 1 tablet by mouth once a day 2)  Imdur 60 Mg Tb24 (Isosorbide mononitrate) .... Take 1 tablet by mouth once a day 3)  Lumigan 0.03 % Soln (Bimatoprost) .... Apply 4)  Nitrostat 0.4 Mg  Subl (Nitroglycerin) .... Dissolve one under tongue as needed as directed 5)  Carvedilol 25 Mg Tabs (Carvedilol) .Marland Kitchen.. 1 pill by mouth 2 times daily 6)  Vytorin 10-40 Mg Tabs (Ezetimibe-simvastatin) .... Take 1 tablet by mouth at bedtime 7)  Fluticasone Propionate 50 Mcg/act Susp (Fluticasone propionate) .... 2 sprays each nostril once daily 8)  Levobunolol Hcl 0.25 % Soln (Levobunolol hcl) .... Once daily 9)  Alphagan P 0.15 % Soln (Brimonidine tartrate) .... Two times a day 10)  Allopurinol 300 Mg Tabs (Allopurinol) .... Take one tab by mouth once daily 11)  Prednisone 10 Mg Tabs (Prednisone) .... 2 by mouth once daily or as directed  Patient Instructions: 1)  see me 10 - 14 days Prescriptions: PREDNISONE 10 MG  TABS (PREDNISONE) 2 by mouth once daily or as directed  #60 x 0   Entered and Authorized by:   Birdie Sons MD   Signed by:   Birdie Sons MD on 02/06/2009   Method used:   Electronically to        [facility]       [address]       [place], [place]  [address]       Ph: [phone]       Fax: [phone]   RxID:   [id number]

## 2010-02-10 NOTE — Letter (Signed)
Summary: [facility]  [facility]   Imported By: Maryln Gottron 07/10/2009 11:16:02  _____________________________________________________________________  External Attachment:    Type:   Image     Comment:   External Document

## 2010-02-10 NOTE — Miscellaneous (Signed)
Summary: Initial Summary for PT Services/[facility]  Initial Summary for PT Services/[facility]   Imported By: Maryln Gottron 12/01/2009 09:38:11  _____________________________________________________________________  External Attachment:    Type:   Image     Comment:   External Document

## 2010-02-10 NOTE — Assessment & Plan Note (Signed)
Summary: 1 month rov/njr   History of Present Illness: Back pain is his main complaint any activity worsens pain pain does not radiate no weakness duration: progressive for weeks, ongoing for years  Has some chronic shoulder pain---not as bad as his back  ros: fatigue otherwise no complaints    Current Problems (verified): 1)  Hematuria, Hx of  (ICD-V13.09) 2)  Back Pain  (ICD-724.5) 3)  Gout  (ICD-274.9) 4)  Renal Failure  (ICD-586) 5)  Anemia  (ICD-285.9) 6)  Coronary Artery Disease  (ICD-414.00) 7)  Carotid Artery Disease  (ICD-433.10) 8)  Hypertension  (ICD-401.9) 9)  Hyperlipidemia  (ICD-272.4) 10)  Diverticulosis, Colon  (ICD-562.10)  Allergies (verified): 1)  ! Nsaids  Past History:  Past Medical History: Last updated: 12/14/2006 Diverticulosis, colon Hyperlipidemia Hypertension Renal insufficiency glaucoma carotid vascular disease L subclavian stent cataract Coronary artery disease  Past Surgical History: Last updated: 12/14/2006 Appendectomy Coronary artery bypass graft Hemorrhoidectomy CEA knee arthroscopy knee elbow Cataract extraction X2  Family History: Last updated: 2006/10/22 mother deceased elderly father deceased 36 stroke  Social History: Last updated: 06/21/2007 Married Former Smoker Alcohol use-no Regular exercise-no one son deceased (major coronary)  Risk Factors: Exercise: no (10-22-06)  Risk Factors: Smoking Status: quit > 6 months (08/20/2009)  Physical Exam  General:  alert and well-developed.   Head:  normocephalic and atraumatic.   Eyes:  pupils equal and pupils round.   Ears:  R ear normal and L ear normal.   Neck:  No deformities, masses, or tenderness noted. Chest Wall:  no deformities and no tenderness.   Msk:  No deformity or scoliosis noted of thoracic or lumbar spine.  no pai to palpation fo back dtrs at knees normal Neurologic:  dtrs at knees are normal   Impression &  Recommendations:  Problem # 1:  BACK PAIN (ICD-724.5)  chronic problem trial PT discussed with patient  Orders: Physical Therapy Referral (PT)  Problem # 2:  CORONARY ARTERY DISEASE (ICD-414.00)  no sxs continue current medications  His updated medication list for this problem includes:    Furosemide 40 Mg Tabs (Furosemide) .Marland Kitchen... Take 1 tablet by mouth once a day    Imdur 60 Mg Tb24 (Isosorbide mononitrate) .Marland Kitchen... Take 1 tablet by mouth once a day    Nitrostat 0.4 Mg Subl (Nitroglycerin) .Marland Kitchen... Dissolve one under tongue as needed as directed    Carvedilol 25 Mg Tabs (Carvedilol) .Marland Kitchen... 1 pill by mouth 2 times daily  Labs Reviewed: Chol: 124 (09/17/2009)   HDL: 33.40 (09/17/2009)   LDL: 58 (09/17/2009)   TG: 163.0 (09/17/2009)  Problem # 3:  HYPERTENSION (ICD-401.9) fair control continue current medications  His updated medication list for this problem includes:    Furosemide 40 Mg Tabs (Furosemide) .Marland Kitchen... Take 1 tablet by mouth once a day    Carvedilol 25 Mg Tabs (Carvedilol) .Marland Kitchen... 1 pill by mouth 2 times daily  Prior BP: 150/74 (10/10/2009)  Labs Reviewed: K+: 4.2 (09/17/2009) Creat: : 1.7 (09/17/2009)   Chol: 124 (09/17/2009)   HDL: 33.40 (09/17/2009)   LDL: 58 (09/17/2009)   TG: 163.0 (09/17/2009)  Complete Medication List: 1)  Furosemide 40 Mg Tabs (Furosemide) .... Take 1 tablet by mouth once a day 2)  Imdur 60 Mg Tb24 (Isosorbide mononitrate) .... Take 1 tablet by mouth once a day 3)  Lumigan 0.03 % Soln (Bimatoprost) .... Apply 4)  Nitrostat 0.4 Mg Subl (Nitroglycerin) .... Dissolve one under tongue as needed as directed 5)  Carvedilol 25 Mg  Tabs (Carvedilol) .Marland Kitchen.. 1 pill by mouth 2 times daily 6)  Vytorin 10-40 Mg Tabs (Ezetimibe-simvastatin) .... Take 1 tablet by mouth at bedtime 7)  Fluticasone Propionate 50 Mcg/act Susp (Fluticasone propionate) .... 2 sprays each nostril once daily 8)  Levobunolol Hcl 0.25 % Soln (Levobunolol hcl) .... Once daily 9)  Alphagan P  0.15 % Soln (Brimonidine tartrate) .... Two times a day 10)  Allopurinol 300 Mg Tabs (Allopurinol) .... Take one tab by mouth once daily 11)  Prednisone 5 Mg Tabs (Prednisone) .... Take 1/2 by mouth every other day  Patient Instructions: 1)  Please schedule a follow-up appointment in 2 months.   Orders Added: 1)  Est. Patient Level IV [[address]] 2)  Physical Therapy Referral [PT]

## 2010-02-10 NOTE — Progress Notes (Signed)
  Phone Note Call from Patient   Caller: Patient Call For: Birdie Sons MD Summary of Call: Still not sleeping and wants to know what Dr. Cato Mulligan wants him to do.  Cannot sleep x 7 nights at all.  WANTS a sleeping pill. [facility] [phone] Initial call taken by: Lynann Beaver CMA AAMA,  November 19, 2009 3:39 PM  Follow-up for Phone Call        see rx Follow-up by: Birdie Sons MD,  November 20, 2009 6:31 AM  Additional Follow-up for Phone Call Additional follow up Details #1::        Left message on pt's personal voice mail. Additional Follow-up by: Lynann Beaver CMA AAMA,  November 20, 2009 10:13 AM    New/Updated Medications: IMIPRAMINE HCL 25 MG TABS (IMIPRAMINE HCL) 1/2 -1 by mouth at bedtime as needed for sleep Prescriptions: IMIPRAMINE HCL 25 MG TABS (IMIPRAMINE HCL) 1/2 -1 by mouth at bedtime as needed for sleep  #20 x 0   Entered and Authorized by:   Birdie Sons MD   Signed by:   Birdie Sons MD on 11/20/2009   Method used:   Electronically to        [facility]       [address]       [place], [place]  [address]       Ph: [phone]       Fax: [phone]   RxID:   [id number]

## 2010-02-10 NOTE — Assessment & Plan Note (Signed)
Summary: 14 day rov/njr   Vital Signs:  Patient profile:   77 year old male Weight:      184 pounds Temp:     97.2 degrees F Pulse rate:   54 / minute Resp:     12 per minute BP sitting:   148 / 66  (left arm)  Vitals Entered By: Gladis Riffle, RN (February 21, 2009 8:03 AM) CC: 14 day rov, states feels best in long time last several days Is Patient Diabetic? No Comments states no swelling in hands or feet   CC:  14 day rov and states feels best in long time last several days.  History of Present Illness:  Follow-Up Visit      This is an 77 year old man who presents for Follow-up visit.  The patient denies chest pain and palpitations.  Since the last visit the patient notes no new problems or concerns.  The patient reports taking meds as prescribed.  When questioned about possible medication side effects, the patient notes none.  Feels much better. No pain, no swelling related to gout All other systems reviewed and were negative   Preventive Screening-Counseling & Management  Alcohol-Tobacco     Smoking Status: quit > 6 months     Year Started: 1948     Year Quit: 1990  Current Problems (verified): 1)  Gout  (ICD-274.9) 2)  Renal Failure  (ICD-586) 3)  Knee Pain, Right  (ICD-719.46) 4)  Anemia  (ICD-285.9) 5)  Coronary Artery Disease  (ICD-414.00) 6)  Carotid Artery Disease  (ICD-433.10) 7)  Hypertension  (ICD-401.9) 8)  Hyperlipidemia  (ICD-272.4) 9)  Diverticulosis, Colon  (ICD-562.10)  Medications Prior to Update: 1)  Furosemide 40 Mg Tabs (Furosemide) .... Take 1 Tablet By Mouth Once A Day 2)  Imdur 60 Mg Tb24 (Isosorbide Mononitrate) .... Take 1 Tablet By Mouth Once A Day 3)  Lumigan 0.03 % Soln (Bimatoprost) .... Apply 4)  Nitrostat 0.4 Mg Subl (Nitroglycerin) .... Dissolve One Under Tongue As Needed As Directed 5)  Carvedilol 25 Mg  Tabs (Carvedilol) .Marland Kitchen.. 1 Pill By Mouth 2 Times Daily 6)  Vytorin 10-40 Mg  Tabs (Ezetimibe-Simvastatin) .... Take 1 Tablet By Mouth  At Bedtime 7)  Fluticasone Propionate 50 Mcg/act  Susp (Fluticasone Propionate) .... 2 Sprays Each Nostril Once Daily 8)  Levobunolol Hcl 0.25 % Soln (Levobunolol Hcl) .... Once Daily 9)  Alphagan P 0.15 % Soln (Brimonidine Tartrate) .... Two Times A Day 10)  Allopurinol 300 Mg Tabs (Allopurinol) .... Take One Tab By Mouth Once Daily 11)  Prednisone 10 Mg  Tabs (Prednisone) .... 2 By Mouth Once Daily or As Directed  Current Medications (verified): 1)  Furosemide 40 Mg Tabs (Furosemide) .... Take 1 Tablet By Mouth Once A Day 2)  Imdur 60 Mg Tb24 (Isosorbide Mononitrate) .... Take 1 Tablet By Mouth Once A Day 3)  Lumigan 0.03 % Soln (Bimatoprost) .... Apply 4)  Nitrostat 0.4 Mg Subl (Nitroglycerin) .... Dissolve One Under Tongue As Needed As Directed 5)  Carvedilol 25 Mg  Tabs (Carvedilol) .Marland Kitchen.. 1 Pill By Mouth 2 Times Daily 6)  Vytorin 10-40 Mg  Tabs (Ezetimibe-Simvastatin) .... Take 1 Tablet By Mouth At Bedtime 7)  Fluticasone Propionate 50 Mcg/act  Susp (Fluticasone Propionate) .... 2 Sprays Each Nostril Once Daily 8)  Levobunolol Hcl 0.25 % Soln (Levobunolol Hcl) .... Once Daily 9)  Alphagan P 0.15 % Soln (Brimonidine Tartrate) .... Two Times A Day 10)  Allopurinol 300 Mg Tabs (Allopurinol) .Marland KitchenMarland KitchenMarland Kitchen  Take One Tab By Mouth Once Daily 11)  Prednisone 10 Mg  Tabs (Prednisone) .... 2 By Mouth Once Daily or As Directed  Allergies: 1)  ! Nsaids  Past History:  Past Medical History: Last updated: 12/14/2006 Diverticulosis, colon Hyperlipidemia Hypertension Renal insufficiency glaucoma carotid vascular disease L subclavian stent cataract Coronary artery disease  Past Surgical History: Last updated: 12/14/2006 Appendectomy Coronary artery bypass graft Hemorrhoidectomy CEA knee arthroscopy knee elbow Cataract extraction X2  Family History: Last updated: 10/09/2006 mother deceased elderly father deceased 38 stroke  Social History: Last updated: 06/21/2007 Married Former  Smoker Alcohol use-no Regular exercise-no one son deceased (major coronary)  Risk Factors: Exercise: no (2006/10/09)  Risk Factors: Smoking Status: quit > 6 months (02/21/2009)  Review of Systems       All other systems reviewed and were negative   Physical Exam  General:  Well-developed,well-nourished,in no acute distress; alert,appropriate and cooperative throughout examination Head:  normocephalic and atraumatic.   Eyes:  pupils equal and pupils round.   Ears:  R ear normal and L ear normal.   Nose:  no external deformity and no external erythema.   Neck:  No deformities, masses, or tenderness noted. Chest Wall:  no deformities and no tenderness.   Lungs:  normal respiratory effort and no intercostal retractions.   Heart:  normal rate and regular rhythm.   Abdomen:  soft and non-tender.   Msk:  No deformity or scoliosis noted of thoracic or lumbar spine.   Neurologic:  cranial nerves II-XII intact and gait normal.     Impression & Recommendations:  Problem # 1:  GOUT (ICD-274.9)  he is feeling much better on steroids and allopurionol decrease prednisone to 15 mg by mouth once daily  His updated medication list for this problem includes:    Allopurinol 300 Mg Tabs (Allopurinol) .Marland Kitchen... Take one tab by mouth once daily  see me 10 days  Orders: Prescription Created Electronically [phone])  Complete Medication List: 1)  Furosemide 40 Mg Tabs (Furosemide) .... Take 1 tablet by mouth once a day 2)  Imdur 60 Mg Tb24 (Isosorbide mononitrate) .... Take 1 tablet by mouth once a day 3)  Lumigan 0.03 % Soln (Bimatoprost) .... Apply 4)  Nitrostat 0.4 Mg Subl (Nitroglycerin) .... Dissolve one under tongue as needed as directed 5)  Carvedilol 25 Mg Tabs (Carvedilol) .Marland Kitchen.. 1 pill by mouth 2 times daily 6)  Vytorin 10-40 Mg Tabs (Ezetimibe-simvastatin) .... Take 1 tablet by mouth at bedtime 7)  Fluticasone Propionate 50 Mcg/act Susp (Fluticasone propionate) .... 2 sprays each  nostril once daily 8)  Levobunolol Hcl 0.25 % Soln (Levobunolol hcl) .... Once daily 9)  Alphagan P 0.15 % Soln (Brimonidine tartrate) .... Two times a day 10)  Allopurinol 300 Mg Tabs (Allopurinol) .... Take one tab by mouth once daily 11)  Prednisone 5 Mg Tabs (Prednisone) .... 3 by mouth once daily or as directed  Patient Instructions: 1)  see me 10 days Prescriptions: PREDNISONE 5 MG TABS (PREDNISONE) 3 by mouth once daily or as directed  #100 x 1   Entered and Authorized by:   Birdie Sons MD   Signed by:   Birdie Sons MD on 02/21/2009   Method used:   Electronically to        [facility]       [address]       [place], [place]  [address]  Ph: [phone]       Fax: [phone]   RxID:   [id number]   Prevention & Chronic Care Immunizations   Influenza vaccine: Fluvax 3+  (10/09/2008)   Influenza vaccine due: 11/01/2008    Tetanus booster: 07/28/2007: Td   Tetanus booster due: 07/27/2017    Pneumococcal vaccine: Pneumovax  (10/11/2001)   Pneumococcal vaccine due: None    H. zoster vaccine: Not documented  Colorectal Screening   Hemoccult: Not documented    Colonoscopy: Abnormal  (12/11/2004)   Colonoscopy due: 12/12/2014  Other Screening   PSA: Not documented   Smoking status: quit > 6 months  (02/21/2009)  Lipids   Total Cholesterol: 92  (12/24/2008)   LDL: 48  (12/24/2008)   LDL Direct: Not documented   HDL: 28.30  (12/24/2008)   Triglycerides: 79.0  (12/24/2008)    SGOT (AST): 24  (12/24/2008)   SGPT (ALT): 30  (12/24/2008)   Alkaline phosphatase: 70  (12/24/2008)   Total bilirubin: 0.6  (12/24/2008)  Hypertension   Last Blood Pressure: 148 / 66  (02/21/2009)   Serum creatinine: 1.7  (01/16/2009)   Serum potassium 4.3  (01/16/2009)  Self-Management Support :    Hypertension self-management support: Not documented    Lipid self-management support: Not documented

## 2010-02-10 NOTE — Assessment & Plan Note (Signed)
Summary: ?skin cancer/cjr   Vital Signs:  Patient profile:   77 year old male Weight:      194 pounds BMI:     25.69 Temp:     98.4 degrees F oral Pulse rate:   64 / minute Pulse rhythm:   regular Resp:     12 per minute BP sitting:   168 / 74  (left arm) Cuff size:   regular  Vitals Entered By: Gladis Riffle, RN (May 16, 2009 11:46 AM)  Procedure Note Last Tetanus: Td (07/28/2007)  Mole Biopsy/Removal: Onset of lesion: months  Procedure # 1: cryotherapy    Location: scalp    Comment: lesion frozen with liquid ntg  CC: concerned about skin cancer on top of head and at left nares Is Patient Diabetic? No   CC:  concerned about skin cancer on top of head and at left nares.  History of Present Illness: has enlarging , scaley lesion on top of scalp ongoing for months worse past several weeks  All other systems reviewed and were negative   Preventive Screening-Counseling & Management  Alcohol-Tobacco     Smoking Status: quit > 6 months     Year Started: 1948     Year Quit: 1990  Current Medications (verified): 1)  Furosemide 40 Mg Tabs (Furosemide) .... Take 1 Tablet By Mouth Once A Day 2)  Imdur 60 Mg Tb24 (Isosorbide Mononitrate) .... Take 1 Tablet By Mouth Once A Day 3)  Lumigan 0.03 % Soln (Bimatoprost) .... Apply 4)  Nitrostat 0.4 Mg Subl (Nitroglycerin) .... Dissolve One Under Tongue As Needed As Directed 5)  Carvedilol 25 Mg  Tabs (Carvedilol) .Marland Kitchen.. 1 Pill By Mouth 2 Times Daily 6)  Vytorin 10-40 Mg  Tabs (Ezetimibe-Simvastatin) .... Take 1 Tablet By Mouth At Bedtime 7)  Fluticasone Propionate 50 Mcg/act  Susp (Fluticasone Propionate) .... 2 Sprays Each Nostril Once Daily 8)  Levobunolol Hcl 0.25 % Soln (Levobunolol Hcl) .... Once Daily 9)  Alphagan P 0.15 % Soln (Brimonidine Tartrate) .... Two Times A Day 10)  Allopurinol 300 Mg Tabs (Allopurinol) .... Take One Tab By Mouth Once Daily 11)  Prednisone 5 Mg Tabs (Prednisone) .Marland Kitchen.. 1 and 1/2  By Mouth Once Daily or  As Directed 12)  Doxepin Hcl 10 Mg Caps (Doxepin Hcl) .... Take 1 Tab By Mouth At Bedtime As Needed Insomnia  Allergies: 1)  ! Nsaids  Review of Systems       No CP or SOB  Physical Exam  General:  Well-developed,well-nourished,in no acute distress; alert,appropriate and cooperative throughout examination Head:  normocephalic and atraumatic.   Eyes:  pupils equal and pupils round.   Skin:  Scaley, lesion top of scalp   Impression & Recommendations:  Problem # 1:  ACTINIC KERATOSIS (ICD-702.0) discussed verbal consent for cryotheraopy Orders: Cryotherapy/Destruction benign or premalignant lesion (1st lesion)  ([address]000)  Complete Medication List: 1)  Furosemide 40 Mg Tabs (Furosemide) .... Take 1 tablet by mouth once a day 2)  Imdur 60 Mg Tb24 (Isosorbide mononitrate) .... Take 1 tablet by mouth once a day 3)  Lumigan 0.03 % Soln (Bimatoprost) .... Apply 4)  Nitrostat 0.4 Mg Subl (Nitroglycerin) .... Dissolve one under tongue as needed as directed 5)  Carvedilol 25 Mg Tabs (Carvedilol) .Marland Kitchen.. 1 pill by mouth 2 times daily 6)  Vytorin 10-40 Mg Tabs (Ezetimibe-simvastatin) .... Take 1 tablet by mouth at bedtime 7)  Fluticasone Propionate 50 Mcg/act Susp (Fluticasone propionate) .... 2 sprays each nostril once  daily 8)  Levobunolol Hcl 0.25 % Soln (Levobunolol hcl) .... Once daily 9)  Alphagan P 0.15 % Soln (Brimonidine tartrate) .... Two times a day 10)  Allopurinol 300 Mg Tabs (Allopurinol) .... Take one tab by mouth once daily 11)  Prednisone 5 Mg Tabs (Prednisone) .Marland Kitchen.. 1 and 1/2  by mouth once daily or as directed 12)  Doxepin Hcl 10 Mg Caps (Doxepin hcl) .... Take 1 tab by mouth at bedtime as needed insomnia

## 2010-02-10 NOTE — Progress Notes (Signed)
Summary: question  Phone Note From Pharmacy   Caller: [facility] Call For: swords  Summary of Call: refill prednisone 5mg  three daily or as directed by physician.  #100/1 Do you want hime to continue on this?  Same way? Initial call taken by: Gladis Riffle, RN,  August 18, 2009 3:31 PM  Follow-up for Phone Call        should see me first...no urgency Follow-up by: Birdie Sons MD,  August 19, 2009 6:37 AM  Additional Follow-up for Phone Call Additional follow up Details #1::        Has appt 08/20/09. Additional Follow-up by: Gladis Riffle, RN,  August 19, 2009 11:43 AM

## 2010-02-10 NOTE — Assessment & Plan Note (Signed)
Summary: med check/refill/cjr   Vital Signs:  Patient profile:   77 year old male Weight:      200 pounds Temp:     97.5 degrees F oral Pulse rate:   52 / minute Pulse rhythm:   irregular Resp:     12 per minute BP sitting:   152 / 68  (left arm) Cuff size:   regular  Vitals Entered By: Gladis Riffle, RN (August 20, 2009 11:05 AM) CC: discuss prednisone Is Patient Diabetic? No   CC:  discuss prednisone.  History of Present Illness: His main complaint is back pain most sever in the a.m. he was taking prednisone for gout---no relief of back pain  gout---on prednisone 7.5 mg by mouth once daily , no recurrence  HTN---tolerating meds  All other systems reviewed and were negative   Preventive Screening-Counseling & Management  Alcohol-Tobacco     Smoking Status: quit > 6 months     Year Started: 1950     Year Quit: 1990  Current Problems (verified): 1)  Gout  (ICD-274.9) 2)  Renal Failure  (ICD-586) 3)  Anemia  (ICD-285.9) 4)  Coronary Artery Disease  (ICD-414.00) 5)  Carotid Artery Disease  (ICD-433.10) 6)  Hypertension  (ICD-401.9) 7)  Hyperlipidemia  (ICD-272.4) 8)  Diverticulosis, Colon  (ICD-562.10)  Current Medications (verified): 1)  Furosemide 40 Mg Tabs (Furosemide) .... Take 1 Tablet By Mouth Once A Day 2)  Imdur 60 Mg Tb24 (Isosorbide Mononitrate) .... Take 1 Tablet By Mouth Once A Day 3)  Lumigan 0.03 % Soln (Bimatoprost) .... Apply 4)  Nitrostat 0.4 Mg Subl (Nitroglycerin) .... Dissolve One Under Tongue As Needed As Directed 5)  Carvedilol 25 Mg  Tabs (Carvedilol) .Marland Kitchen.. 1 Pill By Mouth 2 Times Daily 6)  Vytorin 10-40 Mg  Tabs (Ezetimibe-Simvastatin) .... Take 1 Tablet By Mouth At Bedtime 7)  Fluticasone Propionate 50 Mcg/act  Susp (Fluticasone Propionate) .... 2 Sprays Each Nostril Once Daily 8)  Levobunolol Hcl 0.25 % Soln (Levobunolol Hcl) .... Once Daily 9)  Alphagan P 0.15 % Soln (Brimonidine Tartrate) .... Two Times A Day 10)  Allopurinol 300 Mg  Tabs (Allopurinol) .... Take One Tab By Mouth Once Daily 11)  Prednisone 5 Mg Tabs (Prednisone) .Marland Kitchen.. 1 and 1/2  By Mouth Once Daily or As Directed 12)  Doxepin Hcl 10 Mg Caps (Doxepin Hcl) .... Take 1 Tab By Mouth At Bedtime As Needed Insomnia  Allergies: 1)  ! Nsaids  Past History:  Past Medical History: Last updated: 12/14/2006 Diverticulosis, colon Hyperlipidemia Hypertension Renal insufficiency glaucoma carotid vascular disease L subclavian stent cataract Coronary artery disease  Past Surgical History: Last updated: 12/14/2006 Appendectomy Coronary artery bypass graft Hemorrhoidectomy CEA knee arthroscopy knee elbow Cataract extraction X2  Family History: Last updated: 10-10-2006 mother deceased elderly father deceased 57 stroke  Social History: Last updated: 06/21/2007 Married Former Smoker Alcohol use-no Regular exercise-no one son deceased (major coronary)  Risk Factors: Exercise: no (Oct 10, 2006)  Risk Factors: Smoking Status: quit > 6 months (08/20/2009)  Physical Exam  General:  alert and well-developed.   Head:  normocephalic and atraumatic.   Eyes:  pupils equal and pupils round.   Ears:  R ear normal and L ear normal.   Neck:  No deformities, masses, or tenderness noted. Chest Wall:  no deformities and no tenderness.   Lungs:  normal respiratory effort and no intercostal retractions.   Msk:  No deformity or scoliosis noted of thoracic or lumbar spine.   Pulses:  R radial normal and L radial normal.   Skin:  turgor normal and color normal.     Impression & Recommendations:  Problem # 1:  BACK PAIN (ICD-724.5)  ongoing problem will try muscle relaxer at night back exercises discussed see me one month  His updated medication list for this problem includes:    Cyclobenzaprine Hcl 10 Mg Tabs (Cyclobenzaprine hcl) .Marland Kitchen... Take 1 tab by mouth at bedtime  Problem # 2:  GOUT (ICD-274.9) no recurrence,  taper prednisone His updated  medication list for this problem includes:    Allopurinol 300 Mg Tabs (Allopurinol) .Marland Kitchen... Take one tab by mouth once daily  Problem # 3:  CORONARY ARTERY DISEASE (ICD-414.00)  no recurrent sxs His updated medication list for this problem includes:    Furosemide 40 Mg Tabs (Furosemide) .Marland Kitchen... Take 1 tablet by mouth once a day    Imdur 60 Mg Tb24 (Isosorbide mononitrate) .Marland Kitchen... Take 1 tablet by mouth once a day    Nitrostat 0.4 Mg Subl (Nitroglycerin) .Marland Kitchen... Dissolve one under tongue as needed as directed    Carvedilol 25 Mg Tabs (Carvedilol) .Marland Kitchen... 1 pill by mouth 2 times daily  Labs Reviewed: Chol: 92 (12/24/2008)   HDL: 28.30 (12/24/2008)   LDL: 48 (12/24/2008)   TG: 79.0 (12/24/2008)  Complete Medication List: 1)  Furosemide 40 Mg Tabs (Furosemide) .... Take 1 tablet by mouth once a day 2)  Imdur 60 Mg Tb24 (Isosorbide mononitrate) .... Take 1 tablet by mouth once a day 3)  Lumigan 0.03 % Soln (Bimatoprost) .... Apply 4)  Nitrostat 0.4 Mg Subl (Nitroglycerin) .... Dissolve one under tongue as needed as directed 5)  Carvedilol 25 Mg Tabs (Carvedilol) .Marland Kitchen.. 1 pill by mouth 2 times daily 6)  Vytorin 10-40 Mg Tabs (Ezetimibe-simvastatin) .... Take 1 tablet by mouth at bedtime 7)  Fluticasone Propionate 50 Mcg/act Susp (Fluticasone propionate) .... 2 sprays each nostril once daily 8)  Levobunolol Hcl 0.25 % Soln (Levobunolol hcl) .... Once daily 9)  Alphagan P 0.15 % Soln (Brimonidine tartrate) .... Two times a day 10)  Allopurinol 300 Mg Tabs (Allopurinol) .... Take one tab by mouth once daily 11)  Prednisone 5 Mg Tabs (Prednisone) .... Take 1 tablet by mouth once a day for one week and then 1/2 by mouth once daily 12)  Doxepin Hcl 10 Mg Caps (Doxepin hcl) .... Take 1 tab by mouth at bedtime as needed insomnia 13)  Cyclobenzaprine Hcl 10 Mg Tabs (Cyclobenzaprine hcl) .... Take 1 tab by mouth at bedtime  Patient Instructions: 1)  Please schedule a follow-up appointment in 1  month. Prescriptions: PREDNISONE 5 MG TABS (PREDNISONE) Take 1 tablet by mouth once a day for one week and then 1/2 by mouth once daily  #30 x 1   Entered and Authorized by:   Birdie Sons MD   Signed by:   Birdie Sons MD on 08/20/2009   Method used:   Electronically to        [facility]       [address]       [place], [place]  [address]       Ph: [phone]       Fax: [phone]   RxID:   [id number] PREDNISONE 5 MG TABS (PREDNISONE) Take 1 tablet by mouth once a day and then 1/2 by mouth once daily  #30 x 1   Entered and Authorized by:   Birdie Sons  MD   Signed by:   Birdie Sons MD on 08/20/2009   Method used:   Electronically to        [facility]       [address]       [place], [place]  [address]       Ph: [phone]       Fax: [phone]   RxID:   [id number] CYCLOBENZAPRINE HCL 10 MG  TABS (CYCLOBENZAPRINE HCL) Take 1 tab by mouth at bedtime  #30 x 1   Entered and Authorized by:   Birdie Sons MD   Signed by:   Birdie Sons MD on 08/20/2009   Method used:   Electronically to        [facility]       [address]       [place], [place]  [address]       Ph: [phone]       Fax: [phone]   RxID:   [id number]

## 2010-02-12 NOTE — Letter (Signed)
Summary: [facility]  [facility]   Imported By: Maryln Gottron 12/24/2009 08:57:17  _____________________________________________________________________  External Attachment:    Type:   Image     Comment:   External Document

## 2010-02-12 NOTE — Miscellaneous (Signed)
Summary: Discharge Summary for PT [facility]  Discharge Summary for PT Services/[facility]   Imported By: Maryln Gottron 12/25/2009 13:26:25  _____________________________________________________________________  External Attachment:    Type:   Image     Comment:   External Document

## 2010-02-25 ENCOUNTER — Encounter: Payer: Self-pay | Admitting: Internal Medicine

## 2010-02-25 ENCOUNTER — Other Ambulatory Visit: Payer: Self-pay | Admitting: Internal Medicine

## 2010-02-26 ENCOUNTER — Telehealth: Payer: Self-pay | Admitting: Internal Medicine

## 2010-02-26 ENCOUNTER — Encounter: Payer: Self-pay | Admitting: Internal Medicine

## 2010-02-26 ENCOUNTER — Ambulatory Visit (INDEPENDENT_AMBULATORY_CARE_PROVIDER_SITE_OTHER): Payer: Medicare Other | Admitting: Internal Medicine

## 2010-02-26 DIAGNOSIS — M109 Gout, unspecified: Secondary | ICD-10-CM

## 2010-02-26 DIAGNOSIS — G4700 Insomnia, unspecified: Secondary | ICD-10-CM

## 2010-02-26 DIAGNOSIS — M7580 Other shoulder lesions, unspecified shoulder: Secondary | ICD-10-CM | POA: Insufficient documentation

## 2010-02-26 DIAGNOSIS — M75100 Unspecified rotator cuff tear or rupture of unspecified shoulder, not specified as traumatic: Secondary | ICD-10-CM

## 2010-02-26 DIAGNOSIS — I2510 Atherosclerotic heart disease of native coronary artery without angina pectoris: Secondary | ICD-10-CM

## 2010-02-26 DIAGNOSIS — E785 Hyperlipidemia, unspecified: Secondary | ICD-10-CM

## 2010-02-26 DIAGNOSIS — I10 Essential (primary) hypertension: Secondary | ICD-10-CM

## 2010-02-26 DIAGNOSIS — M719 Bursopathy, unspecified: Secondary | ICD-10-CM

## 2010-02-26 DIAGNOSIS — N19 Unspecified kidney failure: Secondary | ICD-10-CM

## 2010-02-26 DIAGNOSIS — M67919 Unspecified disorder of synovium and tendon, unspecified shoulder: Secondary | ICD-10-CM

## 2010-02-26 LAB — HEPATIC FUNCTION PANEL
ALT: 14 U/L (ref 0–53)
AST: 18 U/L (ref 0–37)
Bilirubin, Direct: 0.3 mg/dL (ref 0.0–0.3)
Total Bilirubin: 1.2 mg/dL (ref 0.3–1.2)
Total Protein: 6.5 g/dL (ref 6.0–8.3)

## 2010-02-26 LAB — BASIC METABOLIC PANEL
BUN: 28 mg/dL — ABNORMAL HIGH (ref 6–23)
CO2: 30 mEq/L (ref 19–32)
Chloride: 103 mEq/L (ref 96–112)
Potassium: 3.9 mEq/L (ref 3.5–5.1)

## 2010-02-26 LAB — LIPID PANEL
Cholesterol: 115 mg/dL (ref 0–200)
LDL Cholesterol: 62 mg/dL (ref 0–99)
Total CHOL/HDL Ratio: 4

## 2010-02-26 MED ORDER — LISINOPRIL 20 MG PO TABS: 20.0000 mg | ORAL_TABLET | Freq: Every day | ORAL | Status: DC

## 2010-02-26 NOTE — Assessment & Plan Note (Signed)
Previously controlled Continue current meds

## 2010-02-26 NOTE — Assessment & Plan Note (Signed)
No sxs Continue same meds 

## 2010-02-26 NOTE — Assessment & Plan Note (Addendum)
Not controlled Add lisinopril Reviewed previous records---note creatinine of 3.7  He will need close f/u of CRT Side effects discussed

## 2010-02-26 NOTE — Telephone Encounter (Signed)
Refer to dr Lajoyce Corners for right rotator cuff tendonitis

## 2010-02-26 NOTE — Progress Notes (Signed)
  Subjective:    Patient ID: [name], male    DOB: 1924/04/16, 77 y.o.   MRN: [id number]  HPI  patient comes in for multiple medical problems including hyperlipidemia, gout, hypertension, coronary artery disease , chronic right shoulder pain and insomnia. The things that bother her the most of the shoulder pain and insomnia. The insomnia has been going on for years. I have reviewed sleep study. In regard to the shoulder pain he feels significant shoulder pain with any activity. He has previously tried to avoid orthopedic intervention but is now willing to see orthopedist.  No past medical history on file. Past Surgical History  Procedure Date  . Appendectomy   . Coronary artery bypass graft   . Hemorrhoid surgery   . Knee arthroscopy   . Elbow surgery   . Cataract extraction     x2    reports that he quit smoking about 22 years ago. He does not have any smokeless tobacco history on file. His alcohol and drug histories not on file. family history includes Stroke in his father.       Review of Systems  Denies chest pains or shortness of breath, PND, orthopnea, lower extremity edema. He complains of insomnia and right shoulder pain but no other specific complaints in a complete review of systems.  Objective:  Physical Exam  elderly male in no acute distress. HEENT exam atraumatic, normocephalic, neck supple. Chest her auscultation cardiac exam S1 and S2 are regular. Abdomen; soft, soft, nontender. Extremities no edema. He has limited abduction of the right shoulder to 20. There range of motion of the left shoulder with abduction to 70.     Assessment & Plan:

## 2010-02-26 NOTE — Assessment & Plan Note (Signed)
No significant recurrence. 

## 2010-02-26 NOTE — Assessment & Plan Note (Signed)
This is an ongoing problem. He's had previous steroid injections with minimal relief. The pain is now constant and interferes with his activities of daily living. He like further evaluation. Will refer to orthopedist.

## 2010-02-26 NOTE — Telephone Encounter (Signed)
Triage vm----was told to call back with a name of an ortho dr. The name is Dr Aldean Baker @ [facility]---ph---[phone].

## 2010-02-26 NOTE — Assessment & Plan Note (Signed)
Chronic problem Reviewed sleep study He may benefit from formal sleep evaluation

## 2010-02-27 ENCOUNTER — Telehealth: Payer: Self-pay | Admitting: Internal Medicine

## 2010-02-27 NOTE — Telephone Encounter (Signed)
Notified pt. 

## 2010-02-27 NOTE — Telephone Encounter (Signed)
In addition to current meds

## 2010-02-27 NOTE — Telephone Encounter (Signed)
Triage vm------ patient was given a rx for Lisinopril on yesterday. Patient would like to know should he take this in addition to what he was already taking, or in place of?

## 2010-02-27 NOTE — Progress Notes (Signed)
Pt aware.

## 2010-03-05 ENCOUNTER — Other Ambulatory Visit: Payer: Medicare Other | Admitting: Internal Medicine

## 2010-03-05 VITALS — BP 94/62

## 2010-03-05 DIAGNOSIS — N19 Unspecified kidney failure: Secondary | ICD-10-CM

## 2010-03-05 LAB — BASIC METABOLIC PANEL
CO2: 30 mEq/L (ref 19–32)
Chloride: 104 mEq/L (ref 96–112)
Creatinine, Ser: 2.1 mg/dL — ABNORMAL HIGH (ref 0.4–1.5)
Glucose, Bld: 99 mg/dL (ref 70–99)

## 2010-03-05 NOTE — Progress Notes (Signed)
Need to put something here so I can close the visit.

## 2010-03-05 NOTE — Progress Notes (Signed)
Pt came in for labs and wanted his BP checked

## 2010-03-19 ENCOUNTER — Telehealth: Payer: Self-pay | Admitting: *Deleted

## 2010-03-19 ENCOUNTER — Encounter: Payer: Self-pay | Admitting: Internal Medicine

## 2010-03-19 ENCOUNTER — Ambulatory Visit (INDEPENDENT_AMBULATORY_CARE_PROVIDER_SITE_OTHER): Payer: Medicare Other | Admitting: Internal Medicine

## 2010-03-19 VITALS — BP 102/66 | Temp 98.4°F | Wt 175.0 lb

## 2010-03-19 DIAGNOSIS — K529 Noninfective gastroenteritis and colitis, unspecified: Secondary | ICD-10-CM

## 2010-03-19 DIAGNOSIS — K5289 Other specified noninfective gastroenteritis and colitis: Secondary | ICD-10-CM

## 2010-03-19 DIAGNOSIS — R6884 Jaw pain: Secondary | ICD-10-CM

## 2010-03-19 MED ORDER — ONDANSETRON HCL 4 MG PO TABS: 4.0000 mg | ORAL_TABLET | Freq: Three times a day (TID) | ORAL | Status: AC | PRN

## 2010-03-19 NOTE — Telephone Encounter (Signed)
Pt said he told you at [facility] that he was having jawbone issues.  You told him that you could give him names of physicians that could help and he left without them.  Please advise

## 2010-03-19 NOTE — Progress Notes (Signed)
  Subjective:     [name] is a 77 y.o. male who presents for evaluation of N/V/ minimal diarrhea . Symptoms have been present for 3 days. Patient denies blood in stool, constipation, dark urine, dysuria, fever, heartburn, hematemesis, hematuria and melena. Patient's oral intake has been normal. Patient's urine output has been adequate. Other contacts with similar symptoms include: none. Patient denies recent travel history. Patient has not had recent ingestion of possible contaminated food, toxic plants, or inappropriate medications/poisons.   The following portions of the patient's history were reviewed and updated as appropriate: allergies, current medications, past family history, past medical history, past social history, past surgical history and problem list.  Review of Systems  patient denies chest pain, shortness of breath, orthopnea. Denies lower extremity edema, abdominal pain, change in appetite, change in bowel movements. Patient denies rashes, musculoskeletal complaints. No other specific complaints in a complete review of systems.     Objective:   well-developed well-nourished male in no acute distress. HEENT exam atraumatic, normocephalic, neck supple without jugular venous distention. Chest clear to auscultation cardiac exam S1-S2 are regular. Abdominal exam overweight with bowel sounds, soft and nontender. Extremities no edema. Neurologic exam is alert with a normal gait.     Assessment:    Acute Gastroenteritis    Plan:    1. Discussed oral rehydration, reintroduction of solid foods, signs of dehydration. 2. Return or go to emergency department if worsening symptoms, blood or bile, signs of dehydration, diarrhea lasting longer than 5 days or any new concerns. 3. Follow up as needed.

## 2010-03-20 NOTE — Telephone Encounter (Signed)
[facility]

## 2010-03-20 NOTE — Telephone Encounter (Signed)
Referral put in, pt aware

## 2010-03-24 ENCOUNTER — Telehealth: Payer: Self-pay | Admitting: *Deleted

## 2010-03-24 ENCOUNTER — Encounter: Payer: Self-pay | Admitting: Internal Medicine

## 2010-03-24 NOTE — Telephone Encounter (Signed)
Dr. Timoteo Gaul asked me to call [name] Asp and advise of any diarrhea and vomiting occurences over the weekend.  Nothing severe but would like to speak with [name] Asp to report.   Returned call to pt and LMOM//slm

## 2010-03-26 LAB — DIFFERENTIAL
Lymphs Abs: 2.2 10*3/uL (ref 0.7–4.0)
Monocytes Relative: 8 % (ref 3–12)
Neutro Abs: 4.4 10*3/uL (ref 1.7–7.7)
Neutrophils Relative %: 54 % (ref 43–77)

## 2010-03-26 LAB — CBC
Hemoglobin: 14.1 g/dL (ref 13.0–17.0)
MCHC: 33.3 g/dL (ref 30.0–36.0)
RDW: 14.7 % (ref 11.5–15.5)

## 2010-03-26 LAB — URINALYSIS, ROUTINE W REFLEX MICROSCOPIC
Bilirubin Urine: NEGATIVE
Glucose, UA: NEGATIVE mg/dL
Protein, ur: NEGATIVE mg/dL

## 2010-03-26 LAB — URINE MICROSCOPIC-ADD ON

## 2010-03-26 LAB — COMPREHENSIVE METABOLIC PANEL
BUN: 24 mg/dL — ABNORMAL HIGH (ref 6–23)
Calcium: 8.9 mg/dL (ref 8.4–10.5)
Glucose, Bld: 97 mg/dL (ref 70–99)
Sodium: 139 mEq/L (ref 135–145)
Total Protein: 7.2 g/dL (ref 6.0–8.3)

## 2010-03-26 LAB — PROTIME-INR: INR: 1.01 (ref 0.00–1.49)

## 2010-04-01 NOTE — Telephone Encounter (Signed)
Pt is feeling better.  He thinks he has or had malaria.  He has had malaria before in the summer of 1950.  He has an appt with Dr Cato Mulligan on Friday and will discuss this with him then.

## 2010-04-03 ENCOUNTER — Ambulatory Visit: Admitting: Internal Medicine

## 2010-04-25 DIAGNOSIS — M542 Cervicalgia: Secondary | ICD-10-CM | POA: Insufficient documentation

## 2010-04-25 DIAGNOSIS — Z951 Presence of aortocoronary bypass graft: Secondary | ICD-10-CM | POA: Insufficient documentation

## 2010-04-25 DIAGNOSIS — I2510 Atherosclerotic heart disease of native coronary artery without angina pectoris: Secondary | ICD-10-CM | POA: Insufficient documentation

## 2010-04-25 DIAGNOSIS — Z79899 Other long term (current) drug therapy: Secondary | ICD-10-CM | POA: Insufficient documentation

## 2010-04-25 DIAGNOSIS — E7800 Pure hypercholesterolemia, unspecified: Secondary | ICD-10-CM | POA: Insufficient documentation

## 2010-04-25 DIAGNOSIS — M47812 Spondylosis without myelopathy or radiculopathy, cervical region: Secondary | ICD-10-CM | POA: Insufficient documentation

## 2010-04-26 ENCOUNTER — Emergency Department (HOSPITAL_COMMUNITY)
Admission: EM | Admit: 2010-04-26 | Discharge: 2010-04-26 | Disposition: A | Discharge status: Home / Self Care | Payer: Medicare Other | Attending: Emergency Medicine | Admitting: Emergency Medicine

## 2010-04-26 ENCOUNTER — Emergency Department (HOSPITAL_COMMUNITY): Payer: Medicare Other

## 2010-05-04 ENCOUNTER — Encounter: Payer: Self-pay | Admitting: Internal Medicine

## 2010-05-04 ENCOUNTER — Ambulatory Visit (INDEPENDENT_AMBULATORY_CARE_PROVIDER_SITE_OTHER): Payer: Medicare Other | Admitting: Internal Medicine

## 2010-05-04 DIAGNOSIS — N390 Urinary tract infection, site not specified: Secondary | ICD-10-CM

## 2010-05-04 DIAGNOSIS — I2510 Atherosclerotic heart disease of native coronary artery without angina pectoris: Secondary | ICD-10-CM

## 2010-05-04 DIAGNOSIS — I10 Essential (primary) hypertension: Secondary | ICD-10-CM

## 2010-05-04 NOTE — Assessment & Plan Note (Signed)
I've asked him to monitor BP at home Goal bp<135/85

## 2010-05-04 NOTE — Assessment & Plan Note (Signed)
Pt has no sxs He is considering arthroscopic surgery Prior to generalize anesthesia I would recommend stress testing

## 2010-05-04 NOTE — Progress Notes (Signed)
  Subjective:    Patient ID: [name], male    DOB: 1924-09-25, 77 y.o.   MRN: [id number]  HPI   [name] to the ED last week due to acute neck pain. Sxs have improved. He went to dr duda---treated with tramadol and prednisone. He is feeling better.   HTN---tolerating meds  He has noted some hematuria and dysuria 6 days ago - has completely resolved.   Gout - no recurrence  No past medical history on file. Past Surgical History  Procedure Date  . Appendectomy   . Coronary artery bypass graft   . Hemorrhoid surgery   . Knee arthroscopy   . Elbow surgery   . Cataract extraction     x2    reports that he quit smoking about 22 years ago. He does not have any smokeless tobacco history on file. His alcohol and drug histories not on file. family history includes Stroke in his father. Allergies  Allergen Reactions  . Nsaids     REACTION: renal failure     Review of Systems  patient denies chest pain, shortness of breath, orthopnea. Denies lower extremity edema, abdominal pain, change in appetite, change in bowel movements. Patient denies rashes, musculoskeletal complaints. No other specific complaints in a complete review of systems.      Objective:   Physical Exam  Well-developed well-nourished male in no acute distress. HEENT exam atraumatic, normocephalic, extraocular muscles are intact. Neck is supple. No jugular venous distention no thyromegaly. Chest clear to auscultation without increased work of breathing. Cardiac exam S1 and S2 are regular. Abdominal exam active bowel sounds, soft, nontender. Extremities no edema. Neurologic exam she is alert without any motor sensory deficits. Gait is normal.      Assessment & Plan:

## 2010-05-07 ENCOUNTER — Other Ambulatory Visit: Payer: Self-pay | Admitting: *Deleted

## 2010-05-07 LAB — URINE CULTURE: Colony Count: 80000

## 2010-05-07 MED ORDER — CIPROFLOXACIN HCL 500 MG PO TABS: 500.0000 mg | ORAL_TABLET | Freq: Two times a day (BID) | ORAL | Status: AC

## 2010-05-26 NOTE — Consult Note (Signed)
NAMECAIDYN, Chambers                  ACCOUNT NO.:  [id number]   MEDICAL RECORD NO.:  [id number]          PATIENT TYPE:  EMS   LOCATION:  [facility]                         FACILITY:  [facility]   PHYSICIAN:  Artist Pais. Weingold, M.D.DATE OF BIRTH:  Mar 17, 1924   DATE OF CONSULTATION:  07/28/2007  DATE OF DISCHARGE:                                 CONSULTATION   REFERRING PHYSICIAN:  Trudi Ida. Denton Lank, M.D.   REASON FOR CONSULTATION:  [name] is a very pleasant 77 year old male  who is right-hand dominant who was involved in an incident with a cat  about 6 days ago, where he was punctured on the volar aspect of his  index finger right in the volar pad just distal to the DIP flexion  crease and presents today with pain and swelling at the request of his  medical doctor, Dr. Birdie Sons.  He is an otherwise, fairly healthy 77-  year-old male with significant cardiac history in the past; bypass  surgery, hypertension, etc.  He is on multiple meds but does not list  them.  About again 6-8 days ago, he had punctured wound to the lower  aspect of his index finger on the right.  He has been on Augmentin for  the past 6 days, twice a day.  He has been putting Neosporin on  intermittently.  No other treatment has been rendered, no  antiinflammatories, and no soaks.   EXAM:  Showed a well-nourished male, pleasant, alert and oriented x3.  He has a small bit of purulence to a very small puncture wound just  distal to the DIP flexion crease.  His Kanavel signs are negative.  He  has no pain in the flexor sheath.  He has no increased pain with flexion  or extension.  No significant streak of the arm, no erythema on the  wrist, forearm or hand, and again localized discharge from a small  puncture wound.  A fair amount of purulence was expressed from this  wound, it was cultured, and the patient was then placed in a sterile  dressing.   At this point in time, I had a full and frank  discussion with Mr.  Chambers and his daughter.  At this point in time, I recommend adding  doxycycline 100 one p.o. b.i.d. to cover for methicillin-resistant Staph  and start him on warm antibacterial soaks 3 times a day for 20 minutes.  Also, start Advil 600 mg 1 p.o. t.i.d. with food, and I will check him  in my office tomorrow to make sure he is progressing.  Again at this  point in time, there is no evidence of flexor sheath involvement, no  evidence of joint involvement.  X-ray showed no foreign material,  arthritic changes only.  He will be discharged from emergency department  with doxycycline, to take in addition to his Augmentin, he will take  Advil 3 times a day 600 mg, and I will see him in my office tomorrow at  noon.      Artist Pais Mina Marble, M.D.  Electronically Signed  MAW/MEDQ  D:  07/28/2007  T:  07/29/2007  Job:  [id number]   cc:   Valetta Mole. Swords, MD

## 2010-05-26 NOTE — Assessment & Plan Note (Signed)
OFFICE VISIT   [name], [name]  DOB:  [birth year]                                       06/06/2009  MWUXL#:[id number]   Date of surgery was 10/08/2004, which was a right carotid  endarterectomy, and 08/11/2004, which was a left carotid endarterectomy  by Dr. Madilyn Fireman.   Patient returns to clinic today for surveillance of his carotid arteries  following bilateral carotid endarterectomies, as previously described.  He states that he has had no problems with TIAs or stroke-like symptoms.  He has hypercholesterolemia, and coronary artery disease remains stable  and under control.   SOCIAL HISTORY:  He is widowed.  He has 1 child.  He is retired.  He  discontinued cigarettes in 01/30/1988.  He does not drink alcohol.   Review of systems was significant for some shortness of breath on  exertion and clotting disorder.  He denied all other interrogatories.   PHYSICAL FINDINGS:  Heart rate was 67, blood pressure 148/76, O2  saturation was 98%.  He was an elderly gentleman, well-nourished in no  distress.  HEENT:  NCAT.  PERRLA.  EOMI.  Membranes are pink and moist.  Neck was without jugular venous distention.  I appreciated no carotid  bruits.  He had well-healed carotid endarterectomy incisions.  Lungs  were clear to auscultation.  Cardiac exam revealed a regular rate and  rhythm.  Abdomen was soft, nontender, nondistended.  Musculoskeletal  exam demonstrated no major deformities.  Neurological exam was nonfocal.   LABORATORY WORK:  He did undergo a bilateral carotid ultrasounds.  These  have been stable.  Specifically, he had a 1% to 39% narrowing of his  right internal carotid artery and no narrowing of his left internal  carotid artery.  There was no change in his bilateral internal carotid  artery Dopplers since 06/08/2007.   Patient continues to do very well following bilateral carotid  endarterectomy.  We will continue to follow him for recurrent disease.  He was given a return appointment in 6 months with carotid ultrasound.   Wilmon Arms, PA   Larina Earthly, M.D.  Electronically Signed   KEL/MEDQ  D:  06/06/2009  T:  06/06/2009  Job:  [id number]   cc:   Valetta Mole. Swords, MD

## 2010-05-26 NOTE — Procedures (Signed)
CAROTID DUPLEX EXAM   INDICATION:  Follow up bilateral carotid endarterectomy sites.   HISTORY:  Diabetes:  No.  Cardiac:  CABG.  Hypertension:  No.  Smoking:  No.  Previous Surgery:  Left carotid endarterectomy on 08/11/2004, right  carotid endarterectomy on 10/08/2004.  CV History:  Currently asymptomatic.  Amaurosis Fugax No, Paresthesias No, Hemiparesis No                                       RIGHT             LEFT  Brachial systolic pressure:         140               138  Brachial Doppler waveforms:         Normal            Normal  Vertebral direction of flow:        Antegrade         Not visualized  DUPLEX VELOCITIES (cm/sec)  CCA peak systolic                   45                58  ECA peak systolic                   39                66  ICA peak systolic                   70                91  ICA end diastolic                   22                29  PLAQUE MORPHOLOGY:                  Heterogeneous  PLAQUE AMOUNT:                      Minimal           None  PLAQUE LOCATION:                    Proximal ICU   IMPRESSION:  1. Patent right carotid endarterectomy site with a 1-39% stenosis of      the right internal carotid artery.  2. Patent left carotid endarterectomy site with no left ICA stenosis.   ___________________________________________  Larina Earthly, M.D.   EM/MEDQ  D:  11/27/2009  T:  11/27/2009  Job:  [id number]

## 2010-05-26 NOTE — Procedures (Signed)
CAROTID DUPLEX EXAM   INDICATION:  Follow up bilateral carotid endarterectomy sites.   HISTORY:  Diabetes:  No.  Cardiac:  CABG.  Hypertension:  No.  Smoking:  No.  Previous Surgery:  Left carotid endarterectomy on 08/11/2004, right  carotid endarterectomy on 10/08/2004.  CV History:  Currently asymptomatic.  Amaurosis Fugax No, Paresthesias No, Hemiparesis No.                                       RIGHT             LEFT  Brachial systolic pressure:         144               148  Brachial Doppler waveforms:         Normal            Normal  Vertebral direction of flow:        Antegrade         Antegrade  (dampened)  DUPLEX VELOCITIES (cm/sec)  CCA peak systolic                   43                51  ECA peak systolic                   46                95  ICA peak systolic                   40                48  ICA end diastolic                   15                15  PLAQUE MORPHOLOGY:                  Heterogenous  PLAQUE AMOUNT:                      Minimal           None  PLAQUE LOCATION:                    Proximal ICA   IMPRESSION:  1. Patent right carotid endarterectomy site with 1% to 39% stenosis of      the right internal carotid artery.  2. Patent left carotid endarterectomy site with no left internal      carotid artery stenosis.  3. No significant change in the bilateral internal carotid artery      Doppler velocities when compared to the previous examination on      06/08/2007.   ___________________________________________  Larina Earthly, M.D.   CH/MEDQ  D:  06/06/2009  T:  06/06/2009  Job:  [id number]

## 2010-05-26 NOTE — Procedures (Signed)
CAROTID DUPLEX EXAM   INDICATION:  Follow-up evaluation of known carotid artery disease.   HISTORY:  Diabetes:  No.  Cardiac:  Coronary artery bypass graft in September, 2004.  Hypertension:  No.  Smoking:  No.  Previous Surgery:  Left carotid endarterectomy with Dacron patch  angioplasty on 08/11/04.  Right carotid endarterectomy with Dacron patch  angioplasty on 10/08/04 by Dr. Madilyn Fireman.  CV History:  Previous duplex performed on 05/27/05 revealed no ICA  stenosis, status post endarterectomies bilaterally.  Patient reports no  cerebrovascular symptoms at this time.  Amaurosis Fugax No, Paresthesias No, Hemiparesis No                                       RIGHT             LEFT  Brachial systolic pressure:         190               190  Brachial Doppler waveforms:         Triphasic         Triphasic  Vertebral direction of flow:        Antegrade         Inaudible  DUPLEX VELOCITIES (cm/sec)  CCA peak systolic                   47                56  ECA peak systolic                   49                65  ICA peak systolic                   51                49  ICA end diastolic                   13                13  PLAQUE MORPHOLOGY:                  Soft              Soft  PLAQUE AMOUNT:                      Mild              Minimal  PLAQUE LOCATION:                    Distal CCA, proximal ICA            Proximal ICA   IMPRESSION:  1. 20-39% right internal carotid artery stenosis, status post      endarterectomy.  2. No left internal carotid artery stenosis, status post      endarterectomy.  3. Inaudible left vertebral artery suggests occlusion.   ___________________________________________  P. Liliane Bade, M.D.   MC/MEDQ  D:  06/08/2007  T:  06/08/2007  Job:  [id number]

## 2010-05-26 NOTE — Assessment & Plan Note (Signed)
[facility]                            CARDIOLOGY OFFICE NOTE   GIFFORD, BALLON                         MRN:          [id number]  DATE:06/22/2006                            DOB:          05/31/1924    PRIMARY CARE PHYSICIAN:  Birdie Sons.   HISTORY OF PRESENT ILLNESS:  Mr. [name] is an 77 year old gentleman with  coronary and lower extremity vascular disease as well as asymptomatic  cerebrovascular disease.  He is status post coronary artery bypass  grafting in 2004.  I subsequently stented a severe stenosis in his left  subclavian artery which was limiting flow to his LIMA graft.  Since  then, he has had only occasional angina which is due to totally occluded  non-bypassed obtuse marginal branch.  He has not been using much  nitroglycerin of late.  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  He is quite proud of this.   He has not had any symptoms concerning for TIA or stroke.  He has not  had any heart failure.   His current medications are:  1. Imdur 60 mg daily.  2. Fosinopril 40 mg each morning.  3. Lasix 40 mg daily.  4. Vytorin 10/40 one daily.  5. K-Dur 20 mEq daily.  6. Terazosin 1 mg each morning.  7. Azopt eye drops twice daily.  8. Lumigan eye drops each evening.  9. Aspirin 81 mg daily.  10.Carvedilol 3.125 mg twice daily.   PHYSICAL EXAMINATION:  He is generally well appearing in no distress  with pulse of 48, blood pressure 122/66 on the left and 128/64 on the  right.  Weight is 174 pounds.  He has no jugular venous distention, thyromegaly or lymphadenopathy.  His scars are consistent with bilateral carotid endarterectomy.  Respiratory effort is normal.  Lungs are clear to auscultation.  He has a nondisplaced point of maximal cardiac impulse.  There is a  regular rate and rhythm with a 2/6 short systolic ejection murmur at the  right upper sternal border without radiation.  There is no S3.  The  abdomen is soft, nondistended, nontender.  There is no  hepatosplenomegaly.  Bowel sounds are normal.  The extremities are warm without clubbing, cyanosis, edema or  ulceration.   Electrocardiogram demonstrates sinus bradycardia with right bundle  branch block.   IMPRESSION/RECOMMENDATIONS:  1. Coronary disease:  Doing nicely with much improved angina. Will not      advance the beta blocker due to resting bradycardia.  2. Cerebrovascular disease:  Due for repeat carotid duplex.  He is      asymptomatic.  3. Left subclavian stenosis:  Doing nicely after stenting.  Blood      pressure in both arms indicate continued patency.  4. Left renal artery stenosis:  Creatinine is increased from 1.6 in      October 2006 to 1.8 in October 2007 and then 2 earlier this week.      Unilateral renal artery stenosis would not likely explain this      unless he  also had substantial hypertensive nephrosclerosis in the      right kidney as well.  We will check renal duplex and hold his      fosinopril.  We will recheck creatinine and then have him followup      with Dr. Excell Seltzer in 1 months' time.  5. Hypercholesterolemia:  Managed by Dr. Cato Mulligan.  Goal LDL less than      70.     Salvadore Farber, MD  Electronically Signed    WED/MedQ  DD: 06/22/2006  DT: 06/23/2006  Job #: [id number]   cc:   Valetta Mole. Swords, MD

## 2010-05-29 NOTE — Op Note (Signed)
[name], [name]                  ACCOUNT NO.:  [id number]   MEDICAL RECORD NO.:  [id number]          PATIENT TYPE:  INP   LOCATION:  3302                         FACILITY:  [facility]   PHYSICIAN:  Balinda Quails, M.D.    DATE OF BIRTH:  18-Jan-1924   DATE OF PROCEDURE:  08/11/2004  DATE OF DISCHARGE:                                 OPERATIVE REPORT   SURGEON:  Denman George, MD.   ASSISTANT:  Jerold Coombe, P.A.   ANESTHETIC:  General endotracheal.   ANESTHESIOLOGIST:  Smith.   PREOPERATIVE DIAGNOSIS:  Symptomatic left internal carotid artery stenosis.   POSTOPERATIVE DIAGNOSIS:  Symptomatic left internal carotid artery stenosis.   PROCEDURE:  Left carotid endarterectomy with Dacron patch angioplasty.   HISTORY.:  [name] is a 77 year old male who was admitted to [facility] in June with a sudden onset of right hand weakness. CT scan  revealed no evidence of bleed. He did develop complete reversal of his right  hand weakness. He underwent workup which revealed evidence of left carotid  disease. He is referred for evaluation, seen in the office, and it was  recommended he undergo left carotid endarterectomy for reduction of stroke  risk. The patient consented for surgery.   OPERATIVE PROCEDURE:  The patient brought to the operating room stable  condition. Placed in supine position. General endotracheal anesthesia  induced. Left neck prepped and draped in sterile fashion. Arterial line,  Foley catheter in place.   Curvilinear skin incision made along the anterior border left sternomastoid  muscle. Dissection carried down through subcutaneous tissue. Platysma  divided with electrocautery. Deep dissection carried down to expose the  carotid vessels. The facial vein ligated with 3-0 silk and divided. The  carotid bifurcation exposed. The common carotid artery mobilized down to the  omohyoid muscle and encircled with vessel loop. The internal carotid  direction the external carotid and superior thyroid were freed and encircled  with vessel loops. The distal internal carotid artery then mobilized. There  was an extensive amount of plaque in the internal carotid artery extending 3-  4 cm up the left internal carotid artery. The hypoglossal nerve was  mobilized and the ansa hypoglossi was divided. Bridging veins across the  internal carotid artery were ligated with 3-0 silk and divided. The distal  internal carotid artery mobilized up above the level of the posterior belly  of the digastric muscle. The patient administered 7000 units heparin  intravenously. Carotid vessels controlled with clamps. Longitudinal  arteriotomy made in the distal common carotid artery. The arteriotomy  extended across the carotid bulb up into the internal carotid artery. There  was extensive ulcerative plaque present in the proximal portion of the left  internal carotid artery associated with moderate stenosis. Shunt was then  inserted.   An endarterectomy elevator used to remove the plaque. The endarterectomy  carried down into the common carotid artery where plaque was divided  transversely Potts scissors. Plaque then raised up into the bulb where the  superior thyroid and external carotid were endarterectomized using an  eversion technique. The distal internal carotid artery plaque feathered out.  Fragments of plaque were then removed with plaque forceps. The site  irrigated with heparin saline solution and dextran solution.   The endarterectomy site was then patched with a Finesse Dacron patch using  running 6-0 Prolene suture. At completion of the patch angioplasty the shunt  was removed. All vessels were well flushed. Clamps removed directing initial  antegrade flow up the external carotid artery, following this the internal  carotid was released.   There is an excellent pulse and Doppler signal in the distal internal  carotid artery. Adequate  hemostasis obtained. Sponge and instrument counts  correct. The patient administered 50 milligrams protamine intravenously.   Sternomastoid fascia was then closed with running 2-0 Vicryl suture.  Platysma closed running 3-0 Vicryl suture. Skin closed with 4-0 Monocryl.  Steri-Strips applied. Sterile dressing applied.   Anesthesia was reversed the operating room. The patient awakened readily.  Moved all extremities to command. Transferred to recovery in stable  condition.       PGH/MEDQ  D:  08/11/2004  T:  08/12/2004  Job:  [id number]   cc:   Salvadore Farber, M.D. [facility]  [address]  [address]  [place]  [place] [address]   Pramod P. Pearlean Brownie, MD  Fax: [phone]

## 2010-05-29 NOTE — Assessment & Plan Note (Signed)
[facility]                                 ON-CALL NOTE   RAVIS, HERNE                         MRN:          [id number]  DATE:11/13/2006                            DOB:          1924-08-20    PHONE NUMBER:  [phone].   CALLER:  Cala Bradford, the daughter.   OBJECTIVE:  The patient was given carvedilol for blood thinner and  heart.  Recently was increased by Dr. Cato Mulligan at two 12.5 mg tablets  twice a day.  By mistake, he took 4 tablets this morning and she is  concerned.  He did that at 8 o'clock this morning and is doing okay,  other than he has the stomach flu as well and has been in the bathroom  all night.  No terrible or unusual problems otherwise.   Objective is extra medication.   PLAN:  Probably will wear off; it is a 12-hour drug and should be gone  in 12 hours.  I would tell him to push fluids, monitor his pulse as much  as possible.  If he gets real low and he is symptomatic, probably needs  to be seen; but otherwise should do okay.   PRIMARY CARE PHYSICIAN:  Valetta Mole. Swords, MD, [facility].     Arta Silence, MD     RNS/MedQ  DD: 11/13/2006  DT: 11/14/2006  Job #: [id number]   cc:   Valetta Mole. Swords, MD

## 2010-05-29 NOTE — Discharge Summary (Signed)
NAME:  [name], [name]                  ACCOUNT NO.:  [id number]   MEDICAL RECORD NO.:  [id number]          PATIENT TYPE:  INP   LOCATION:  4711                         FACILITY:  [facility]   PHYSICIAN:  [name] P. Pearlean Brownie, MD    DATE OF BIRTH:  08-11-1924   DATE OF ADMISSION:  07/06/2004  DATE OF DISCHARGE:  07/09/2004                                 DISCHARGE SUMMARY   DIAGNOSES AT THE TIME OF DISCHARGE:  1. Small left parietal infarction, secondary to left internal carotid      artery stenosis.  2. Progressive bilateral internal carotid artery stenosis.  3. Renal insufficiency with history of renal artery stenosis in 2004, with      creatinine of 1.4 at that time, now 1.8.  4. Dyslipidemia.  5. History of coronary artery disease with recurrent chest pain, not      thought to be angina.  6. History of coronary artery bypass graft in September 2004.  7. Hypertension.  8. Benign prostatic hypertrophy.  9. Glaucoma.   DISCHARGE MEDICATIONS:  1. Plavix 75 mg daily.  2. Lasix 40 mg daily.  3. Isosorbide 60 mg daily.  4. Potassium 20 mEq daily.  5. Hytrin 1 mg in the morning and 3 mg at 6 p.m.  6. Vytorin 40/10 daily.  7. Lisinopril 40 mg a day.  8. Aspirin 325 mg a day.  9. Foltx 1 daily.   STUDIES PERFORMED:  1. CT of the brain on admission shows no acute abnormality, mild      generalized cerebral atrophy.  2. MRI of the brain shows two or three small acute infarctions in the left      parietal cortex, atrophy and small vessel ischemic changes.  3. MRA of the head shows small caliber internal carotid artery on the      right, which shows moderate to severe stenosis in the cavernous      segment.  There is also some disease in the right middle cerebral      artery.  4. EKG shows sinus bradycardia with sinus arrhythmia with occasional      premature ventricular contractions.  5. 2-D echocardiogram shows ejection fraction of 40-50% with hypokinesis      and the basal inferior wall, no  embolic source.  6. Carotid Doppler shows right greater than 80% internal carotid artery      stenosis, left 60-80% internal carotid artery stenosis and bilateral      artery flow is antegrade.   LABORATORY STUDIES:  Hemoglobin A1c 6.1.  Homocysteine 18.74.  Cholesterol  110, triglycerides 96, HDL 35 and LDL 56.  Urine drug screen was negative.  CBC was normal.  Differential was normal.  Cardiac enzymes were normal.  Chemistry with creatinine 1.8, BUN 43, otherwise normal.  Coags on admission  normal.  Urinalysis normal.   HISTORY OF PRESENT ILLNESS:  Mr. [name] is a 77 year old right-handed male  who developed sudden onset of right hand weakness and numbness about 15  minutes prior to admission to [facility].  He was driving  down [employer],  decided he should take himself to the emergency room and took  himself to [employer].  He presented there and a code stroke was called.  The patient was examined and was having significant hand and intrinsic  muscle weakness with paresthesias.  TPA was considered, but then not given,  due to quick resolution of the symptoms.  He was transferred to [facility] for  admission to the stroke service for further stroke evaluation.   HOSPITAL COURSE:  MRI revealed acute infarction, felt to be secondary to  progressive internal carotid artery stenosis.  He had an angiogram  approximately one year ago, which showed 50% stenosis on the left and 60 on  the right.  This stenosis has progressed to 60-80% stenosis on the left and  greater than 80 on the right.  This was felt to be the etiology of his  infarction.  He does have a coronary artery history.  Dr. Samule Chambers his  physician, saw him in the hospital and okayed him for carotid surgery as  needed.  Dr. Liliane Chambers was consulted and he will follow up with him at  discharge for outpatient CEA.  Will continue aspirin and Plavix for  secondary stroke prevention, as vascular surgery has planned.  Other  risk  factors were identified and included dyslipidemia, for which he was already  on statin and well controlled; hypertension, again well controlled and newly  diagnosed hyperhomocysteinemia for which he was placed on Foltx.  The  patient will be discharged home to follow up with Dr. Pearlean Chambers in several  months, along with followup with Dr. Liliane Chambers for right carotid  endarterectomy in the near future.   CONDITION ON DISCHARGE:  The patient is neurologically normal.   DISCHARGE PLAN:  1. Discharge home.  2. Aspirin and Plavix for secondary prevention.  3. Follow up with Dr. Madilyn Chambers as an outpatient, okayed by Dr. Samule Chambers for      surgery.  4. Follow up with Dr. Pearlean Chambers in two to three months.      S   SB/MEDQ  D:  07/09/2004  T:  07/09/2004  Job:  [id number]   cc:   [name], M.D.  [address]  [place]  [place] [address]   [name], M.D. [facility]  [address]  [address]  [place]  [place] [address]   [name] Mole. Chambers, M.D. [place]   [name], M.D.

## 2010-05-29 NOTE — Assessment & Plan Note (Signed)
[facility]                                 ON-CALL NOTE   ICHOLAS, IRBY                           MRN:          [id number]  DATE:08/01/2006                            DOB:          13-Sep-1924    Phone [phone].  Patient of Dr. Cato Mulligan.   Patient calling at midnight Monday night because he needs a refill on  his nitroglycerin.  He states he is having no problem, no chest pain; he  just wants a refill on his medication.  Explained to patient since it is  midnight, he would need to call the office and get medicine refilled;  since he is not symptomatic, it will wait.     Jeffrey A. Tawanna Cooler, MD  Electronically Signed    JAT/MedQ  DD: 08/02/2006  DT: 08/03/2006  Job #: [id number]

## 2010-05-29 NOTE — Cardiovascular Report (Signed)
NAME:  [name], [name]                            ACCOUNT NO.:  [id number]   MEDICAL RECORD NO.:  [id number]                   PATIENT TYPE:  INP   LOCATION:  3729                                 FACILITY:  [facility]   PHYSICIAN:  Carole Binning, M.D. [facility]         DATE OF BIRTH:  1924/10/02   DATE OF PROCEDURE:  10/03/2002  DATE OF DISCHARGE:                              CARDIAC CATHETERIZATION   PROCEDURE PERFORMED:  Left heart catheterization with coronary angiography,  left ventriculography, and abdominal aortography.   INDICATIONS:  [name] is a 77 year old male with a recent Cardiolite  showing inferior ischemia.  He presented with symptoms compatible with  unstable angina and was referred for cardiac catheterization.   PROCEDURAL NOTE:  A 6-French sheath was placed in the right femoral artery.  Coronary angiography was performed with 6-French JL4 and JR4 catheters.  Left ventriculography and abdominal aortography were performed with an  angled pigtail catheter.  Contrast was Omnipaque.  There were no  complications.   RESULTS:  LEFT VENTRICULOGRAM:  Wall motion is normal.  Ejection fraction calculated at 66%There is no  mitral regurgitation.   ABDOMINAL AORTOGRAM:  Reveals an 80% stenosis in the left renal artery.  Right renal artery is  patent.  There is moderate diffuse atherosclerotic disease of the abdominal  aorta and iliac arteries with significant calcification.   CORONARY ARTERIOGRAPHY:  (Right dominant)  Left main has a distal 50% stenosis.   Left anterior descending artery has a 70% stenosis in the proximal vessel.  The mid vessel has a tubular 40% stenosis.  The LAD gives rise to a small  first diagonal that showed a 60% stenosis.  There is a normal sized second  diagonal which has a 70% stenosis at its origin.   Left circumflex gives rise to a very large branching first marginal and a  small second marginal.  There is a 90% stenosis in the ostium of the  first  marginal followed by 99% stenosis in the proximal body of the first  marginal.  At the bifurcation point of this marginal in the more medial  branch there is a 90% stenosis.   Right coronary artery is a dominant vessel.  The right coronary artery is  100% occluded proximally.  There are bridging collaterals from the proximal  right coronary artery filling the mid right coronary artery.  However, the  distal right coronary artery again appears to be occluded.  Beyond this  occlusion the posterior descending artery and posterolateral branch fill via  left to right collaterals.   IMPRESSION:  1. Normal left ventricular systolic function.  2.     Left renal artery stenosis.  3. Three vessel coronary artery disease.   PLAN:  The patient will be referred for coronary artery bypass surgery.  Carole Binning, M.D. [facility]    MWP/MEDQ  D:  10/03/2002  T:  10/04/2002  Job:  [id number]   cc:   Valetta Mole. Swords, M.D. [facility]

## 2010-05-29 NOTE — Op Note (Signed)
NAME:  [name], [name]                            ACCOUNT NO.:  [id number]   MEDICAL RECORD NO.:  [id number]                   PATIENT TYPE:  INP   LOCATION:  2310                                 FACILITY:  [facility]   PHYSICIAN:  Salvatore Decent. Cornelius Moras, M.D.              DATE OF BIRTH:  [birth year]   DATE OF PROCEDURE:  10/05/2002  DATE OF DISCHARGE:                                 OPERATIVE REPORT   PREOPERATIVE DIAGNOSIS:  Severe 3-vessel coronary artery disease with class  4 unstable angina.   POSTOPERATIVE DIAGNOSIS:  Severe 3-vessel coronary artery disease with class  4 unstable angina.   PROCEDURE:  Median sternotomy for coronary artery bypass grafting x4 with  endoscopic vein harvest from the right thigh (left internal mammary artery  to the distal left anterior descending coronary artery, saphenous vein graft  to the 1st circumflex marginal branch and  sequential saphenous vein graft  to the 2nd circumflex marginal branch, saphenous vein graft to acute  marginal branch).   SURGEON:  Salvatore Decent. Cornelius Moras, M.D.   ASSISTANT:  Kerin Perna, M.D.   SECOND ASSISTANT:  Rowe Clack, P.A.-C.   ANESTHESIA:  General.   INDICATIONS FOR PROCEDURE:  The patient is a 77 year old gentleman  with a  history of  coronary artery disease who presents with progressive symptoms  of angina that now occur at rest and with minimal activity. He was evaluated  by Dr. Glennon Hamilton after being referred by Dr. Birdie Sons. He underwent  elective cardiac catheterization by Dr. Loraine Leriche Pulsipher. This demonstrates  50% left main coronary stenosis with severe 3-vessel coronary artery  disease. Left ventricular function is preserved. A full consultation note  has been dictated previously.   The patient and his family have been counseled at length regarding the  indications, risks and potential benefits of surgery. Alternative treatment  strategies have been discussed. They understand and accept all of the  associated risks of surgery and desire to proceed as described.   DESCRIPTION OF PROCEDURE:  The patient was brought to the operating room on  the above mentioned date and central monitoring is established by the  anesthesia service. Specifically a Swann-Ganz catheter is placed through the  right internal jugular approach. A radial arterial line is placed.  Intravenous antibiotics are administered. Following induction with general  endotracheal anesthesia a Foley catheter is placed.   A baseline  transesophageal echocardiogram  is performed. This demonstrates  the presence of normal left ventricular function with mild left ventricular  hypertrophy. There is mild aortic insufficiency. The aortic valve is  tricuspid and all 3 leaflets open and close well. There is mild sclerosis of  the aortic valve but no signs of any aortic stenosis. No other  significant  abnormalities are identified.   The patient's chest, abdomen, both groins and both lower extremities are  prepped and draped in a sterile manner.  A median sternotomy incision is  performed and the left internal mammary artery is dissected from the chest  wall and prepared for bypass grafting. The left internal mammary artery is  felt to be a good quality conduit.   Simultaneously saphenous vein is obtained from the patient's right thigh  using endoscopic vein harvest technique. An additional segment of saphenous  vein is obtained from the patient's left thigh using open technique. The  saphenous vein conduit is all felt to be good quality. The patient is  heparinized systemically.   The pericardium is opened. The ascending aorta is mildly dilated but free of  any palpable plaques or calcifications. The ascending aorta and the right  atrium are cannulated for cardiopulmonary bypass. A retrograde cardioplegia  catheter is placed through the right atrium into the coronary sinus.  Adequate heparinization is verified.    Cardiopulmonary bypass is begun and the surface of the heart  is inspected.  Distal sites are selected for coronary artery bypass grafting. The distal  right coronary artery is diffusely diseased, chronically occluded and not a  suitable target for  grafting. The only graftable target from the right  coronary  circulation is a medium sized acute marginal branch which  traverses across the acute margin to reach the intraventricular septum  towards the apex of the heart.   Portions of the saphenous vein and the left internal mammary artery are  trimmed to the appropriate lengths. A temperature probe is placed in the  left ventricular septum. A cardioplegia catheter is placed in the ascending  aorta.   The patient is cooled to 32 degrees systemic temperature. The aortic cross  clamp is applied and cardioplegia is delivered  initially in an antegrade  fashion through the aortic root. Additional cardioplegia is subsequently  administered retrograde through the coronary sinus catheter. The initial  cardioplegic arrest and myocardial cooling are felt to be excellent. Repeat  doses of cardioplegia are administered intermittently through the cross  clamp portion of the operation, retrograde through the coronary sinus  catheter  and antegrade through subsequently placed vein grafts to maintain  septal temperature below 15 degrees centigrade. Iced saline slush is applied  for topical hypothermia.   The following distal  coronary anastomoses are performed:  1. The acute marginal branch is grafted with the saphenous vein graft in an     end-to-side fashion. This coronary measures 1.2 mm in diameter and is of     fair quality.  2. The 1st circumflex marginal branch is grafted with a saphenous vein graft     in a side-to-side fashion. The  circumflex coronary artery actually gives     rise to a single large circumflex marginal branch which then  bifurcates.    The 1st circumflex marginal branch is  the medial subbranch. This vessel     measures 1.5 mm in diameter and of good quality at the site of the distal     bypass.  3. The 2nd circumflex marginal branch (__________  Leanord Hawking) is  the larger     of the 2 subbranches and this  is grafted in an end-to-side fashion     using a  sequential saphenous vein graft off of the vein placed to the     1st circumflex marginal branch. This vessel measures 2.0 mm in diameter     and is of good quality at the site of the distal  bypass. It is     intramyocardial.  4. The  distal left anterior descending coronary artery is grafted to the     left internal mammary artery in an end-to-side fashion. This coronary     measures 1.7 mm in diameter and is of fair quality. Both proximal     saphenous vein anastomoses are performed directly to the ascending aorta     prior to removal of the aortic cross clamp.   The septal temperature  is noted to rise rapidly upon reperfusion of the  left internal mammary artery. One final dose of warm, retrograde, hot-shot  cardioplegia is administered. The aortic cross clamp is removed after a  total cross clamp time of 71 minutes.   The heart begins to beat spontaneously without need for cardioversion. All  proximal and distal anastomoses were  inspected for hemostasis and  appropriate graft  orientation. Epicardial pacing wires are affixed to the  right ventricular outflow tract into the right atrial appendage. The patient  is rewarmed to 37 degrees centigrade temperature. The patient was weaned  from cardiopulmonary bypass without difficulty. The patient's rhythm at  separation from bypass is sinus bradycardia. Atrial pacing is employed to  increase  the heart rate. Total cardiopulmonary bypass time for the  operation was 99 minutes.   The venous and arterial cannulae were removed uneventfully. The retrograde  cardioplegia catheter was removed. Protamine was administered to reverse the  anticoagulation. The  mediastinum and the left chest are irrigated with  saline solution containing vancomycin. Meticulous surgical hemostasis is  ascertained. The mediastinum and the left chest are drained with 3 chest  tubes placed through separate stab incisions inferiorly.   The median sternotomy is closed in a routine fashion. The lower extremity  incisions are closed in multiple layers in a routine fashion. All skin  incisions are closed with subcuticular skin closures.   The patient tolerated the procedure well. He was transported to the surgical  intensive care unit in stable condition. There were no interoperative  complications. All sponge, instrument and needle counts were verified  correct at the completion of the operation. No blood products were  administered.                                               Salvatore Decent. Cornelius Moras, M.D.    CHO/MEDQ  D:  10/05/2002  T:  10/07/2002  Job:  [id number]   cc:   Cecil Cranker, M.D.   Carole Binning, M.D. [facility]   Bruce H. Swords, M.D. [facility]

## 2010-05-29 NOTE — Op Note (Signed)
[name], [name]                  ACCOUNT NO.:  [id number]   MEDICAL RECORD NO.:  [id number]           PATIENT TYPE:   LOCATION:                                 FACILITY:   PHYSICIAN:  Balinda Quails, M.D.         DATE OF BIRTH:   DATE OF PROCEDURE:  10/08/2004  DATE OF DISCHARGE:                                 OPERATIVE REPORT   DATE OF OPERATION:  10/08/2004.   SURGEON:  Balinda Quails, M.D.   ASSISTANT:  Theda Belfast, [place].   ANESTHESIA:  General endotracheal.  Anesthesiologist is Dr. Sampson Goon.   PREOPERATIVE DIAGNOSIS:  Severe right internal carotid artery stenosis.   POSTOP DIAGNOSIS:  Severe right internal carotid artery stenosis.   PROCEDURE:  Right carotid endarterectomy, Dacron patch angioplasty.   CLINICAL NOTE:  [name] is a 77 year old male with history of known  carotid artery occlusive disease.  He has previously undergone a left  carotid endarterectomy for a nondisabling left hemispheric CVA.  Recent  follow-up duplex evaluation revealed progression of his right internal  carotid artery stenosis to greater than 80%.  He is brought to the operating  room at this time for elective right carotid endarterectomy.   OPERATIVE PROCEDURE:  The patient was brought to the operating room in  stable hemodynamic condition.  Placed in supine position.  General  endotracheal anesthesia induced.  Foley catheter and arterial lines in  place.   The right neck was prepped and draped in a sterile fashion.  Curvilinear  skin incision was made along the anterior border of the right sternomastoid  muscle.  Dissection carried down through subcutaneous tissue with  electrocautery.  Platysma divided.  The dissection carried along the  anterior border of the sternomastoid down to expose the carotid sheath.  The  facial vein ligated with 2-0 silk and divided.  The carotid bifurcation  exposed.  The vagus nerve reflected posteriorly and preserved.  The common  carotid artery  was mobilized down to the omohyoid muscle and encircled with  a vessel loop.  The internal carotid artery exposed distally up to the  posterior belly of the digastric muscle.  At this level, it was soft and  free of plaque and encircled with a vessel loop.  The superior thyroid and  external carotid were freed and encircled with vessel loops.  The  hypoglossal nerve was retracted superiorly and preserved.   Evaluation of carotid bifurcation verified calcified plaque disease in the  bulb extending into the origin of the right internal carotid artery.  The  patient was administered 7000 units of heparin intravenously.  Adequate  circulation time permitted.  The carotid vessels controlled with clamps.  Longitudinal arteriotomy made in the distal common carotid artery.  The  arteriotomy extended across the carotid bulb and up into the internal  carotid artery.  There was moderate to severe plaque in the right carotid  bulb extending into the right internal carotid artery with a high-grade  stenosis.  A shunt was inserted.   An endarterectomy elevator was then  used to remove the plaque, the  endarterectomy carried down in the common carotid artery, and the plaque was  divided transversely with Potts scissors.  Plaque then raised up into the  bulb and the superior thyroid and external carotid were endarterectomized  using an eversion technique.  The internal carotid artery plaque then  feathered out distally.  Fragments of plaque removed with fine forceps.  A  patch angioplasty through the endarterectomy site was then carried out using  a Finesse Dacron patch and running 6-0 Prolene suture.  At completion of the  patch angioplasty, the shunt was removed.  All vessels were well flushed.  Clamps removed directing initial antegrade flow up the external carotid  artery, following this, the internal carotid was released.  There was an  excellent pulse and Doppler signal in the distal internal  carotid artery.  Adequate hemostasis obtained.  Sponge and instrument counts correct.   The patient administered 50 mg of protamine intravenously.   The sternomastoid fascia was closed with a running 2-0 Vicryl suture.  Platysma closed with running 3-0 Vicryl suture.  Skin closed with 4-0  Monocryl.  Steri-Strips applied.   The patient tolerated the procedure well.  Transferred to the recovery room  in stable condition.  No apparent complications.  Neurologically intact.      Balinda Quails, M.D.  Electronically Signed     PGH/MEDQ  D:  08/25/2005  T:  08/25/2005  Job:  [id number]

## 2010-05-29 NOTE — H&P (Signed)
NAME:  OLIS, [name]                  ACCOUNT NO.:  [id number]   MEDICAL RECORD NO.:  [id number]          PATIENT TYPE:  EMS   LOCATION:  ED                           FACILITY:  [facility]   PHYSICIAN:  Pramod P. Pearlean Brownie, MD    DATE OF BIRTH:  1924-05-25   DATE OF ADMISSION:  07/06/2004  DATE OF DISCHARGE:                                HISTORY & PHYSICAL   REFERRING PHYSICIAN:  Markham Jordan L. Effie Shy, M.D.   REASON FOR REFERRAL:  Stroke.   HISTORY OF PRESENT ILLNESS:  Mr. Hawker is a 77 year old Caucasian male who  developed sudden onset of right hand weakness and numbness about 15 minutes  prior to admission at [facility] emergency room.  A code stroke was called  at 7:15 p.m.  I examined the patient at 7:35 and noted the patient having  significant hand and intrinsic muscle weakness along with paresthesias of  the right hand.  Patient was taken to the CT scan, and by the time he came  back, he had noticed near total improvement in his right hand as well as  paresthesias.  Hence, he was not considered a candidate for IV thrombolysis  with t-PA due to significant improvement in his symptoms.  The patient  denied any accompanied symptoms in the form of headache, slurred speech,  blurred vision, leg weakness, gait or balance problems.  There is no prior  history of stroke, TIA, seizures, headaches, or significant neurological  problems.   PAST MEDICAL HISTORY:  Significant for ischemic heart disease, angina,  hypertension, myocardial infarction.   PAST SURGICAL HISTORY:  Cardiac bypass surgery.   FAMILY HISTORY:  Not significant for anybody with strokes.   SOCIAL HISTORY:  The patient works part-time.  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  He lives alone.  He quit smoking 15 years ago.  He  does not drink alcohol.  He is active and independent in activities of daily  living.  His wife has Alzheimer's and lives in an assisted living facility.   REVIEW OF SYSTEMS:  Not  significant for any fever, cough, shortness of  breath, diarrhea, or other illness.   PHYSICAL EXAMINATION:  VITAL SIGNS:  Afebrile.  Temperature 98 degrees  Fahrenheit, pulse rate 62 per minute, regular sinus rhythm, respiratory rate  18 per minute, blood pressure 122/54.  Distal pulses are well felt.  GENERAL:  A pleasant elderly Caucasian male who is not in any distress.  HEENT:  Head is atraumatic. Unremarkable.  NECK:  Supple without bruit.  LUNGS:  Clear to auscultation.  CARDIAC:  Regular heart sounds without murmur.  ABDOMEN:  Soft and nontender.  EXTREMITIES:  NEUROLOGIC:  Patient is pleasant, awake, alert, cooperative with no aphasia,  apraxia or dysarthria.  Pupils are regular but reactive.  Visual acuity and  fields are adequate.  Face is symmetric.  Bilateral movements are normal.  Tongue is midline.  Motor system exam reveals no upper extremity drift;  however, mild fine finger movement is decreased on the right, compared to  the left.  He  orbits the left over the right upper extremity.  Right and  left grip is fairly good.  There is no weakness of the elbow or  shoulder on  the left.  Lower extremities:  Tone, reflexes, coordination, and sensation  all are symmetric.  Plantars are downgoing.  Finger-to-nose and knee to  ankle coordination accurate.  Gait is not tested.   DATA:  A noncontrast CAT scan of the head done today:  There was no evidence  of any acute abnormality.  No stroke or hemorrhage is seen.   ADMISSION LABS:  CBC and blood chemistries are normal.  Coagulation labs are  pending at this time.   EKG reveals a sinus bradycardia without acute ischemic findings.  Occasional  premature ventricular contractions noted.  Remote anterior infarction is  seen.   Cardiac enzymes are normal.   IMPRESSION:  A 77 year old gentleman with sudden onset of right hand  weakness and paresthesias, who has presented within 30 minutes of onset of  his symptoms.  He has  shown near-total resolution of his symptoms.  He  likely has a small left hemispheric subcortical parietal infarct, etiology  to be decided yet.  The patient does qualify for IV thrombolysis but because  of quick and near-total resolution of his symptoms, I did not think IV  thrombolysis is beneficial, and risks and benefits ratio is not in his  favor.  I discussed the risks of intracerebral hemorrhage being 7%.  At this  point, I think the risks outweighs any possible future benefits.  Patient  understands and is in agreement.  Patient will be counseled at [facility] for further evaluation of his stroke.  He will be kept on telemetry  monitoring.  We will get MRI scan of the brain with MRA of the brain,  carotid Doppler studies as well as 2D echo.  We will continue aspirin and  Plavix for now.  Per discussion with his cardiologist, Dr. Samule Ohm, we may  consider switching him to Aggrenox if he can come off of Plavix and aspirin.       PPS/MEDQ  D:  07/06/2004  T:  07/06/2004  Job:  [id number]   cc:   Cecil Cranker, M.D.   Salvadore Farber, M.D. [facility]  [address]  [address]  [place]  [place] [address]

## 2010-05-29 NOTE — Consult Note (Signed)
NAME:  [name], [name]                            ACCOUNT NO.:  [id number]   MEDICAL RECORD NO.:  [id number]                   PATIENT TYPE:  OIB   LOCATION:  6531                                 FACILITY:  [facility]   PHYSICIAN:  Mark E. Karin Golden, M.D.                DATE OF BIRTH:  1924/04/06   DATE OF CONSULTATION:  07/28/2003  DATE OF DISCHARGE:  07/27/2003                                   CONSULTATION   REASON FOR CONSULTATION:  This is a consultation for interpretation of  intracranial views from a cerebral arteriogram done on July 26, 2003, by Dr.  Geralynn Rile.   HISTORY:  Progressive asymptomatic carotid stenosis.   Right internal carotid arteriogram:  This vessel was opacified via a common  carotid injection.  There is moderate atherosclerotic irregularity of the  precavernous and cavernous segment of the internal carotid artery.  The  stenosis in the precavernous segment could potentially be flow-limiting,  estimated at about 50%.  The cavernous segment shows irregularity, but I do  not have a suspicion of a flow-limiting stenosis in that region.  Beyond  that, flow supplies the right, middle, and anterior cerebral artery  territories which appear normal without stenosis, aneurysm, or vascular  malformation.  The parenchymal and venous phases are normal.   Left internal carotid arteriogram:  This vessel was opacified via a common  carotid injection.  The carotid siphon shows mild atherosclerotic  irregularity, but no stenosis.  Flow from this injection supplies the left  middle, and both anterior cerebral artery territories, the anterior  communicating artery being patent.  There is no evidence of intracranial  stenosis, aneurysm, or vascular malformation.   IMPRESSION:  1. Mild atherosclerotic irregularity in both carotid siphon regions.  2. Stenosis of the precavernous internal carotid artery on the right with     narrowing estimated at 50%.  3. No intracranial  lesion.                                               Mark E. Karin Golden, M.D.    MES/MEDQ  D:  07/28/2003  T:  07/28/2003  Job:  [id number]

## 2010-05-29 NOTE — Discharge Summary (Signed)
[name], [name]                  ACCOUNT NO.:  [id number]   MEDICAL RECORD NO.:  [id number]          PATIENT TYPE:  INP   LOCATION:  3302                         FACILITY:  [facility]   PHYSICIAN:  Balinda Quails, M.D.    DATE OF BIRTH:  04-Feb-1924   DATE OF ADMISSION:  08/11/2004  DATE OF DISCHARGE:  08/12/2004                                 DISCHARGE SUMMARY   ADMISSION DIAGNOSIS:  Symptomatic left internal carotid artery stenosis with  nondisabling left hemispheric cerebrovascular accident.   DISCHARGE DIAGNOSES:  1.  Symptomatic left internal carotid artery stenosis with nondisabling left      hemispheric cerebrovascular accident.  2.  Hypertension.  3.  Dyslipidemia.  4.  Chronic renal insufficiency.  5.  Coronary artery disease with a history of  CABG in 2004.  6.  History of bilateral knee arthroscopies.  7.  Hemorrhoidectomy x2.  8.  History of left elbow dislocation.  9.  Appendectomy.  10. Left axillary lymph node dissection.  11. PTA of left subclavian artery.  12. History of benign prostatic hypertrophy.  13. History of glaucoma.  14. Drug allergies.   PROCEDURE:  August 11, 2004 left carotid endarterectomy with Dacron patch  angioplasty by Dr. Balinda Quails.   HISTORY OF PRESENT ILLNESS:  [name] is a 77 year old Caucasian male who  developed sudden right-handed weakness and numbness on July 06, 2004.  He  presented to the emergency department at [facility].  CT scan at  that time revealed no evidence of bleed.  Upon return from the CT scan his  right hand did improve to near normal.  He denied any symptoms of visual  disturbance, speech problems, leg weakness, gait or balance problems and had  no prior history of stroke or TIA.  Carotid Doppler's were done during that  admission and showed greater than 80% right internal carotid artery stenosis  and left 60-80% internal carotid artery stenosis with bilateral vertebral  artery flow antegrade.  A 2-D  echocardiogram showed ejection fraction  of 40-  50% with hypokinesis in the basal inferior wall with no embolic source.  MRI  of the brain showed 2-3 small acute infarctions in the left parietal cortex,  atrophy and small vessel ischemic changes.  An MRA of the head showed small  caliber internal carotid artery on the right which showed moderate to severe  stenosis in the cavernous segment.  There was some disease in the right  middle cerebral artery as well.  Based on these studies he was referred to  Dr. Balinda Quails of [facility] and was seen on an outpatient basis on July 27, 2004.  He had been on Plavix since discharge.  After exam of the patient Dr.  Madilyn Fireman did recommend that he should be scheduled for an elective left carotid  endarterectomy to reduce further risk of stroke with probable staged right  carotid endarterectomy at a later date.  He was asked to discontinue his  Plavix in anticipation for surgery but to continue his aspirin.   HOSPITAL COURSE:  On  August 11, 2004 [name] was electively admitted to  [facility] and did undergo a left carotid endarterectomy.  There  were no intraoperative complications and he was extubated and neurologically  intact.  After a short stay in recovery he was transferred to unit 3300 with  routine carotid endarterectomy postoperative orders.  He remained stable  overnight and on the morning of postoperative day one vitals showed blood  pressure 127/59, temperature 97.6, heart rate in the 60s and a sinus rhythm,  oxygen saturation 95% on room air.  His labs were stable with a white blood  count of 7.6, hemoglobin 11.6, hematocrit 33.1, platelet count 173.  Sodium  138, potassium 3.8, BUN 16, creatinine 1.4 which was below his baseline of  1.7, blood sugar 129.  His Foley catheter had been discontinued and later  that morning he was able to void without difficulty.  He was able to  tolerate a low fat and low salt diet.  Subjectively he  denied headache,  nausea, vomiting, dysphagia or weakness.  On exam his heart had a regular  rate and rhythm.  Lungs were clear.  Abdominal exam was benign.  Extremities  showed no edema.  Neurologically he was intact.  He was able to move all  extremities x4 and movements were strong and symmetrical.  Incision was  clean, dry and intact with no signs of hematoma.  That morning he was also  able to ambulate in the hallways without difficulty.  Subsequently he was  felt stable for discharge home later that day, August 12, 2004.  He is to  resume his home medications which include the following.   DISCHARGE MEDICATIONS:  1.  Plavix 75 mg daily.  2.  Lasix 40 mg daily.  3.  Isosorbide mononitrate 60 mg daily.  4.  Potassium 20 mEq daily.  5.  Hytrin 1 mg p.o. q. a.m. and 3 mg p.o. q.p.m.  6.  Lisinopril 40 mg p.o. daily.  7.  Aspirin 325 mg p.o. daily.  8.  Foltx one tablet daily.  9.  Tylox 1-2 tablets p.o. q.4h. p.r.n. pain.   DISCHARGE INSTRUCTIONS:  He is instructed to avoid driving or heavy lifting  for the next two weeks.  To follow a low fat and low salt diet.  He may  shower starting August 13, 2004.  Daily walking was encouraged but he should  avoid strenuous activity. He is to call if he develops fever greater than  101, redness or drainage from his incision site.  He is to followup with Dr.  Madilyn Fireman at the [facility] office on August 24, 2004 at 11:30 a.m. and to call sooner  if needed.       AWZ/MEDQ  D:  08/12/2004  T:  08/13/2004  Job:  [id number]   cc:   Salvadore Farber, M.D. [facility]  [address]  [address]  [place]  [place] [address]   Pramod P. Pearlean Brownie, MD  Fax: [phone]

## 2010-05-29 NOTE — H&P (Signed)
[name], [name]                  ACCOUNT NO.:  [id number]   MEDICAL RECORD NO.:  [id number]          PATIENT TYPE:  INP   LOCATION:  NA                           FACILITY:  [facility]   PHYSICIAN:  Balinda Quails, M.D.    DATE OF BIRTH:  [birth year]   DATE OF ADMISSION:  10/05/2004  DATE OF DISCHARGE:                                HISTORY & PHYSICAL   CHIEF COMPLAINT:  Asymptomatic right ICA stenosis.   HISTORY OF PRESENT ILLNESS:  [name] is a 77 year old white male who is  well known to [facility].  The patient recently underwent a left carotid  endarterectomy dating back to August 11, 2004, by Dr. Madilyn Fireman.  The patient  tolerated this procedure well and was discharged home on postoperative day  #1.  Prior to undergoing this operation back in June of 2006, the patient  was admitted to [facility] where he had experienced a nondisabling  left hemispheric cerebrovascular accident.  The patient had sudden onset of  right-hand weakness and numbness about 15 minutes prior to admission to  [facility].  The patient was started on IV thrombolysis with TPA  and admitted to the hospital.  The patient underwent bilateral carotid  Duplex during that admission which showed greater than 80% on the right  internal carotid artery stenosis and left 60-80% ICA stenosis with bilateral  vertebral artery flow antegrade.  A two-dimensional echocardiogram also was  done which showed an EF of 40 to 50% with hypokinesis in the basal inferior  wall and no embolic source.  MRI of the brain showed 2 to 3 small acute  infarctions in the left parietal cortex, atrophy and small vessel ischemic  changes.  Due to the patient having symptoms coming from the left ICA  stenosis, it was felt that his left stenosis should be operated on prior to  his right.  The patient was then scheduled for surgery for August 11, 2004.  He presents today for history of to undergo right CEA which is dated for  September 30, 2004, by Dr. Madilyn Fireman.  The patient is without complaints.  He  denies any headaches, fevers, night sweats or chills, nausea, vomiting,  vertigo, dizziness, seizures, tingling, muscle weakness, dysarthria,  dysphagia, visual changes, syncope, presyncope, memory loss, confusion, or  recent TIA or CVA symptoms.  The patient does state that he does have  numbness in his right foot as well as several of his left hand digits which  he states followed undergoing coronary artery bypass grafting.   PAST MEDICAL HISTORY:  1.  History of TIAs with symptomatic left ICA stenosis with nondisabling      left hemispheric CVA.  2.  History of myocardial infarction.  3.  Hypertension.  4.  Dyslipidemia.  5.  History of left elbow dislocation.  6.  History of glaucoma.  7.  History of 50+ broken bones.   PAST SURGICAL HISTORY:  Coronary artery bypass grafting done in 2004.  Left  CEA done on August 11, 2004.  History of bilateral knee arthroscopies.  Hemorrhoidectomy  x2.  Appendectomy.  Left axillary lymph node dissection.  PTA subclavian artery.   ALLERGIES:  No known drug allergies.   MEDICATIONS:  1.  __________ chloride 20 mg daily.  2.  Terazosin 1 mg in the a.m. and 3 mg in the p.m.  3.  Plavix 75 mg daily.  4.  Vytorin 10/40 daily.  5.  Lasix 40 mg daily.  6.  Isosorbide mononitrate 60 mg daily.  7.  Aspirin 325 mg daily.  8.  Fosinopril 40 mg daily.  9.  Nitro 0.4 mg p.r.n.  10. Lumigan.  11. Azopt.   SOCIAL HISTORY:  The patient is currently married with two children and  lives at home by himself.  History of tobacco use which he quit smoking in  1990.  Denies any alcohol use.  The patient is currently retired, still  driving, and lives here in [place].   FAMILY HISTORY:  Father positive for coronary artery disease as well as  brother.   REVIEW OF SYSTEMS:  See HPI for pertinent positives and negatives.  Otherwise review of systems within normal limits.   PHYSICAL EXAMINATION:   GENERAL:  A well-developed, well-nourished, white  male in no acute distress.  VITAL SIGNS:  Blood pressure 132/68, heart rate 68, respirations 18.  HEENT:  Normocephalic and atraumatic.  Pupils equal, round, and reactive to  light and accommodation.  Extraocular movements intact.  Oral mucosa is pink  and moist.  NECK:  Supple.  The patient has a well-healing scar from his previous left  CEA.  No bruits noted.  CHEST:  Clear to auscultation bilaterally.  HEART:  Regular rate and rhythm with a 3-4/6 holosystolic murmur noted.  The  patient has a well-healed scar from his previous coronary artery bypass  grafting on the sternum.  ABDOMEN:  Bowel sounds x4.  Soft and nontender on palpation.  No  hepatosplenomegaly noted.  GENITOURINARY:  Deferred.  RECTAL:  Deferred.  EXTREMITIES:  The patient has positive nonpitting edema in bilateral lower  extremities.  Bilateral extremities are warm and well perfused.  He has 2+  bilateral radial and femoral pulses noted.  The patient has 1+ bilateral DP  and PT pulses noted.  NEUROLOGY:  Cranial nerves II-XII grossly intact.  The patient is alert and  oriented x4.  Gait is steady.  Muscle strength noted 5/5 upper and lower  extremities bilaterally.  DTRs 2+ bilaterally.   IMPRESSION:  [name] is a 77 year old male who has recently underwent  left CEA.  The patient presents now for evaluation to undergo right CEA.  The patient was seen and evaluated by Dr. Madilyn Fireman.  Dr. Madilyn Fireman discussed with  the patient undergoing right CEA.  He discussed the risks and benefits of  this procedure.  The patient notes understanding and wished to proceed.  He  is scheduled for surgery on September 30, 2004, by Dr. Madilyn Fireman for a right  carotid endarterectomy.  The patient was told that if he has any questions  prior to undergoing surgery, he is to contact us.  If he develops any numbness, weakness, or visual changes, he is to present to the emergency  room immediately.   The patient again acknowledges understanding.      Stephanie Acre Dominick, PA      P. Liliane Bade, M.D.  Electronically Signed    KMD/MEDQ  D:  10/05/2004  T:  10/06/2004  Job:  [id number]

## 2010-05-29 NOTE — H&P (Signed)
NAME:  [name], [name]                            ACCOUNT NO.:  [id number]   MEDICAL RECORD NO.:  [id number]                   PATIENT TYPE:  INP   LOCATION:  3729                                 FACILITY:  [facility]   PHYSICIAN:  Cecil Cranker, M.D.             DATE OF BIRTH:  04-Feb-1924   DATE OF ADMISSION:  10/02/2002  DATE OF DISCHARGE:                                HISTORY & PHYSICAL   The patient is being admitted to [facility] from the [place] office  today.   CHIEF COMPLAINT:  Chest pain.   HISTORY OF PRESENT ILLNESS:  This is a very pleasant 77 year old male who  states he has a long cardiac history.  He previously lived in [place].  He  states he underwent cardiac catheterization there approximately 23 or 24  years ago and was found to have minor blockages.  There is some vague  history of an MI at some point.  Approximately three weeks ago, the patient  had a dizzy spell with subsequent diaphoresis and syncope.  He was referred  by Dr. Cato Mulligan for further cardiac evaluation.  The patient underwent  Cardiolite stress testing on 09/14/2002.  EKG was noted to be positive for  ischemia.  The perfusion images were also positive for ischemia in a  distribution of the right coronary artery.   The patient had a 2-D echocardiogram performed on 09/14/2002.  This showed  normal ejection fraction with some mild aortic sclerosis.   In the office today, the patient reports he has had anginal symptoms for  many years.  These are usually readily relieved with nitroglycerin x 1;  however, he has been experiencing episodes more frequently over the past  several weeks, and he has also had some episodes that have occurred with  rest.  He was seen by myself and Dr. Corinda Gubler in the office today, and  decision was made to admit the patient for cardiac catheterization tomorrow.   PAST MEDICAL HISTORY:  As noted, the patient has a long cardiac history with  a questionable MI in the past  and a previous cardiac catheterization  approximately 24 years ago performed in [place].  He has dyslipidemia.  He  denies history of hypertension or diabetes mellitus.  There is no obesity.  He quit smoking 10 years ago.   ALLERGIES:  No known drug allergies.   MEDICATIONS:  1. Nitroglycerin p.r.n.  2. Nitroglycerin patch 0.2 mg daily.  3. Aspirin 325 mg daily.  4. Fosinopril 40 mg daily.  5. Isosorbide 30 mg b.i.d.  6. Atenolol 12.5 mg b.i.d.  7. Salsalate 500 mg b.i.d.  8. Simvastatin 40 mg daily.  9. Terazosin 3 mg daily.  10.      Quinine p.r.n. for leg cramps.  11.      Latanoprost eye drops 0.005% one drop each eye.   SOCIAL HISTORY:  The patient is married.  He lives in [place].  He is  retired, but he works part time at a [employer].   FAMILY HISTORY:  Noncontributory.   REVIEW OF SYSTEMS:  Significant for syncopal episode approximately three  weeks ago.  He has decreased hearing.  He has some leg pain when walking.  Otherwise, his Review of Systems is unremarkable.   PHYSICAL EXAMINATION:  GENERAL:  Pleasant 77 year old white male in no acute  distress.  VITAL SIGNS:  Blood pressure 140/84, pulse 52, weight 191 pounds.  HEENT:  Unremarkable.  NECK:  Bilateral carotid bruits.  HEART:  Regular rate and rhythm with a grade 2/6 systolic murmur.  LUNGS:  Slightly decreased but clear.  ABDOMEN:  Soft, nontender.  EXTREMITIES:  Pulses weak to absent with no edema.  There are bilateral  femoral bruits noted as well.   LABORATORY DATA:  EKG in the office today shows sinus bradycardia, rate 52  beats per minute with nonspecific T wave abnormalities in the inferior  leads.   IMPRESSION:  1. Chest pain consistent with unstable angina.  2. Abnormal Cardiolite performed on 09/14/2002, positive for ischemia in the     inferior distribution.  3. Recent 2-D echocardiogram with normal left ventricular function and mild     aortic sclerosis.  4. Questionable history of  previous myocardial infarction.  5. History of dyslipidemia.  6. Remote tobacco history.  7. Recent syncope.  8. Bilateral carotid bruits.  9. Bilateral femoral bruits.      Delton See, P.A. [facility]                  Cecil Cranker, M.D.    DR/MEDQ  D:  10/02/2002  T:  10/02/2002  Job:  [id number]   cc:   Valetta Mole. Swords, M.D. [facility]

## 2010-05-29 NOTE — Discharge Summary (Signed)
NAME:  [name], [name]                            ACCOUNT NO.:  [id number]   MEDICAL RECORD NO.:  [id number]                   PATIENT TYPE:  OIB   LOCATION:  6523                                 FACILITY:  [facility]   PHYSICIAN:  Salvadore Farber, M.D. [facility]         DATE OF BIRTH:  Jun 03, 1924   DATE OF ADMISSION:  04/24/2003  DATE OF DISCHARGE:  04/25/2003                           DISCHARGE SUMMARY - REFERRING   DISCHARGE DIAGNOSES:  1. Left subclavian stenosis, status post left subclavian stent placed on     April 24, 2003 by Dr. Randa Evens.  2. Cerebrovascular disease.  3. Left renal artery stenosis 80% in September 2004.  4. Claudication.   Mr. [name] is a 77 year old male patient who is followed in the peripheral  vascular clinic by Dr. Randa Evens.  He underwent CABG in September 2004  and since that time he has had claudication symptoms.  His lower extremity  claudication is better after the starting of Pletal.   Even after the patient's bypass grafting he had complained of modest angina.  A carotid ultrasound demonstrated retrograde flow in the left vertebral  suggestive of subclavian stenosis.  He underwent subclavian angiogram with  the placement of a left subclavian stent, reducing the 80% stenosis to a 0%  residual.  The patient will remain on Plavix on 30 days and an aspirin  indefinitely.  The patient is ready for discharge to home on April 25, 2003.   The patient did have a large hematoma in the right groin with a noted bruit.  The vascular laboratory did see the patient and noted that there was no  evidence of a pseudoaneurysm or AV fistula.  There were two areas of mixed  echogenic material suggestive of hematomas, but no active area of concern.  The patient was discharged to home with instructions to follow-up on April 29, 2003 at 1:45 p.m. for PA follow-up for groin check.  He already has an  appointment scheduled on May 03, 2003 at 9:30 a.m. and I think that  it is  prudent that he keeps this appointment, since he did have this large  hematoma.   He is to clean over the puncture site with soap and water, remain on a low  fat diet, no strenuous activity and he can use Tylenol if needed for pain.  He is to use Plavix 30 mg one p.o. q. day for 30 days, Imdur increase this  to 60 mg a day and he is to continue all other medications as prior to  admission.      Guy Franco, P.A. [facility]                      Salvadore Farber, M.D. [facility]    LB/MEDQ  D:  04/25/2003  T:  04/25/2003  Job:  [id number]

## 2010-05-29 NOTE — Cardiovascular Report (Signed)
NAME:  [name], [name]                            ACCOUNT NO.:  [id number]   MEDICAL RECORD NO.:  [id number]                   PATIENT TYPE:  OIB   LOCATION:  6501                                 FACILITY:  [facility]   PHYSICIAN:  Carole Binning, M.D. [facility]         DATE OF BIRTH:  05-07-1924   DATE OF PROCEDURE:  03/21/2003  DATE OF DISCHARGE:  03/21/2003                              CARDIAC CATHETERIZATION   PROCEDURE PERFORMED:  Left heart catheterization with coronary angiography,  bypass graft angiography, left ventriculography and bilateral subclavian  arteriography.   INDICATION:  [name] is a 77 year old male who underwent coronary artery  bypass surgery in September 2004.  Since then he has continued to have  exertional angina.  He has been found in evaluation to have a significant  left subclavian artery stenosis.  An adenosine Cardiolite scan performed in  the office showed moderate size area of ischemia in the lateral wall as well  as mild ischemia in the inferior wall and a mild fixed defect at the apex  with an ejection fraction of 51%.  The patient was therefore referred for  cardiac catheterization.   CATHETERIZATION PROCEDURAL NOTE:  A 5 French sheath was placed in the right  femoral artery.  Native coronary angiography was performed with 5 [place] JL-  5 and JR-4 catheters.  The saphenous vein graft to the acute marginal branch  was visualized with a JR-4 catheter.  The saphenous vein graft to the obtuse  marginal was visualized with an LCD catheter.  Left subclavian arteriography  was performed with a JR-4 catheter.  The left internal mammary arteriography  was performed using an internal mammary catheter.  Left ventriculography was  performed with an angled pigtail catheter.  Contrast was Omnipaque.  There  were no complications.   CATHETERIZATION RESULTS:   HEMODYNAMICS:  1. Left ventricular pressure 145/18.  2. Aortic pressure 145/60.  3. There is a possible  4-6 mm aortic valve gradient on catheter pullback.     On catheter pullback across the proximal left subclavian artery stenosis,     there was a 24 mm peak-to-peak pressure gradient.   LEFT VENTRICULOGRAM:  There is mild to moderate hypokinesis of the inferior  wall.  Ejection fraction is estimated at approximately 60%.  There is no  significant mitral regurgitation.   Right subclavian arteriography reveals no significant stenosis.   Left subclavian arteriography reveals a focal 80% stenosis in the proximal  left subclavian artery.   CORONARY ARTERIOGRAPHY:  Left main has a distal 50% stenosis.   Left anterior descending artery has a proximal 70% stenosis.  The LAD gives  rise to two small diagonal branches.  The left internal mammary artery graft  was seen filling retrograde via the native LAD.   Left circumflex gives rise to a large bifurcating first obtuse marginal,  small second obtuse marginal branch.  This first obtuse marginal is 100%  occluded proximally with faint filling of the distal vessel.   Right coronary artery is a relatively small vessel.  It gives rise to a  fairly large acute marginal branch and a small posterior descending artery.  The right coronary artery is 100% occluded proximally just after its origin.  The distal right coronary artery is also occluded beyond the acute marginal  branch.  The small diffusely diseased posterior descending artery seen  filling via left-to-right collaterals.   Left internal mammary artery to the distal LAD does appear to be patent.  However, there is significant competitive flow from the native LAD and thus  the entire length of the internal mammary could not be visualized via  antegrade injection.  However, no significant stenoses appreciated in  internal mammary artery.  It is, however, jeopardized by an 80% stenosis in  the proximal left subclavian artery.   There is a sequential saphenous vein graft to both limbs of the  first obtuse  marginal branch.  This vein graft is 100% occluded proximally.   There is a saphenous vein graft to the acute marginal branch.  This is  patent throughout its course filling a large acute marginal branch.   IMPRESSION:  1. Left ventricular systolic function characterized by mild-to-moderate     hypokinesis of  the inferior wall, but overall preserved ejection     fraction.  2. Native three-vessel coronary artery disease.  3. Status post coronary artery bypass surgery.  There was a patent left     internal mammary artery to the left anterior descending. However, this is     jeopardized by a significant stenosis in the proximal left subclavian     artery.  The saphenous vein graft to obtuse marginal branch is 100%     occluded.  The vein graft to the acute marginal is patent.   PLAN:  These findings will be reviewed with Dr. Samule Ohm.  The left subclavian  does appear that it can be treated percutaneously with stent placement.  We  may also want to consider attempts at percutaneous intervention in the  obtuse marginal although this would be challenging procedure given the  complete occlusion of the vessel and the bifurcational nature of the  disease.                                               Carole Binning, M.D. [facility]    MWP/MEDQ  D:  03/21/2003  T:  03/22/2003  Job:  [id number]   cc:   Cecil Cranker, M.D.

## 2010-05-29 NOTE — Consult Note (Signed)
NAME:  [name], [name]                            ACCOUNT NO.:  [id number]   MEDICAL RECORD NO.:  [id number]                   PATIENT TYPE:  INP   LOCATION:  3729                                 FACILITY:  [facility]   PHYSICIAN:  Salvatore Decent. Cornelius Moras, M.D.              DATE OF BIRTH:  04/22/1924   DATE OF CONSULTATION:  10/03/2002  DATE OF DISCHARGE:                                   CONSULTATION   REQUESTING PHYSICIAN:  Carole Binning, M.D.   PRIMARY CARDIOLOGIST:  Cecil Cranker, M.D.   PRIMARY CARE PHYSICIAN:  Valetta Mole. Swords, M.D.   REASON FOR CONSULTATION:  Three vessel coronary artery disease with class IV  angina.   HISTORY OF PRESENT ILLNESS:  Mr. Heier is a 77 year old gentleman who  relocated to [place], [place], from [place] one and one half years  ago.  He has a history of coronary artery disease apparently dating back  approximately 25 years ago when he sustained a mild myocardial infarction.  By report, he underwent cardiac catheterization at that time and was found  to have only minor blockages.  Since then, he has had stable symptoms of  angina for which he takes sublingual nitroglycerin.  Recently, over the last  six months he has developed progressive symptoms of angina such that now he  has episodes of pain almost daily and frequently takes nitroglycerin for  relief of his symptoms.  He has had chest pain which occurs both with  activity and at rest.  Three weeks ago, he developed a syncopal episode.  He  was evaluated by Dr. Cato Mulligan and subsequently referred to Dr. Corinda Gubler for  further management and therapy.  A 2-D echocardiogram  performed September 14, 2002, revealed mild dilatation of the left ventricle with overall left  ventricular function appearing normal.  There was aortic valve sclerosis and  mild aortic regurgitation but no aortic valve stenosis.  Ejection fraction  was estimated 60%.  The patient also underwent a stress Cardiolite  examination which was clinically nondiagnostic but the patient had EKG  changes consistent with ischemia particularly in inferior distribution.  Mr.  Ciancio was subsequently scheduled for elective cardiac catheterization  today.  This was performed by Dr. Gerri Spore and findings are notable for  severe three vessel coronary artery disease with normal left ventricular  function.  Cardiac surgical consultation has been requested.  Findings at  the time of catheterization are also notable for 80% stenosis of the left  renal artery.   MEDICATIONS PRIOR TO ADMISSION:  1. Aspirin 325 mg once daily.  2. Salicylate 500 mg twice daily.  3. Nitroglycerin patch 0.2 mg daily.  4. Fosinopril 40 mg daily.  5. Isosorbide 30 mg twice daily.  6. Atenolol 12.5 mg twice daily.  7. Zocor 40 mg once daily.  8. Terazosin 3 mg once daily.  9. Quinine tablets as necessary for  leg cramps.  10.      Latanoprost eye drops in each eye for glaucoma.  11.      The patient also uses sublingual nitroglycerin tablets as needed.   ALLERGIES:  No known drug allergies or sensitivities.   REVIEW OF SYMPTOMS:  GENERAL:  The patient reports feeling well.  He is  active physically for his age and denies loss of appetite or recent change  in weight.  CARDIAC:  The patient describes classical symptoms of angina  described as a squeezing or pressure-like tightness of pain across his chest  and radiating up his neck.  Symptoms are always relieved promptly with  sublingual nitroglycerin.  These symptoms can occur at rest and with minimal  activity.  He denies any prolonged episodes of chest pain unrelieved by  nitroglycerin.  He reports no associated shortness of breath, dyspnea on  exertion, PND, orthopnea, or lower extremity edema.  He denies any  palpitations but he did have one syncopal episode three weeks ago.  RESPIRATORY:  Negative.  The patient denies shortness of breath.  He reports  no productive cough, hemoptysis,  wheezing.  GASTROINTESTINAL:  Negative.  The patient reports good appetite.  He has no difficulty swallowing.  He  reports normal bowel function with only occasional constipation or diarrhea.  He denies history of hematochezia, hematemesis, melena.  MUSCULOSKELETAL:  Essentially negative.  The patient denies chronic problems with arthritis or  arthralgias.  NEUROLOGIC:  Negative.  The patient denies symptoms suggestive  of previous TIA or stroke.  INFECTIOUS:  Negative.  The patient denies  recent fever or chills.  GENITOURINARY:  Negative.  The patient denies  urinary urgency, frequency, or pain.  HEENT:  The patient has glaucoma but  reports stable eye sight.  He denies any loose teeth or painful teeth.  PSYCHIATRIC:  Negative.   PAST MEDICAL HISTORY:  Notable for history of coronary artery disease with  possible myocardial infarction in the distant past.  The patient also has  history of hyperlipidemia.  He denies known history of hypertension or  diabetes mellitus.  He has a remote history of tobacco use although he quit  smoking 10 years ago.   PAST SURGICAL HISTORY:  This is notable for previous elbow surgery, knee  surgery, appendectomy, and two previous hemorrhoidectomies.  The patient has  had multiple broken bones in the past.   FAMILY HISTORY:  Notable for the absence of early onset coronary artery  disease.   SOCIAL HISTORY:  The patient is married and lives with his [place] in  [place]  He is retired originally from the 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  He currently works part time at a Public affairs consultant.  He has a  longstanding history of tobacco use, although he quit smoking 10 years  previously.  He denies history of excessive alcohol consumption.   PHYSICAL EXAMINATION:  GENERAL APPEARANCE:  A well-appearing white male who appears his stated age in no acute distress.  VITAL SIGNS:  Heart  rate is 47 and sinus bradycardia by telemetry monitor.  Blood pressure is normotensive and afebrile.  HEENT:  Essentially unrevealing.  NECK:  Supple.  There is no cervical or supraclavicular lymphadenopathy.  There is no jugular venous distension.  No carotid bruits are noted.  CHEST:  Auscultation of the chest reveals clear and symmetrical breath  sounds bilaterally.  No wheezes or rhonchi are noted.  CARDIOVASCULAR:  Regular rate and rhythm.  There is a grade 2/6 systolic  murmur heard best at the right sternal border.  No diastolic murmurs are  noted.  ABDOMEN:  Soft and nontender.  Bowel sounds are present.  The liver edge is  not enlarged.  There are no palpable masses.  EXTREMITIES:  Warm and well perfused.  There is no lower extremity edema.  Distal pulses are not palpable in either lower leg at the ankle.  There is  no veinous insufficiency.  RECTAL AND GU:  Examinations are both deferred.  SKIN:  Clean and dry and healthy-appearing throughout.  NEUROLOGIC:  Grossly nonfocal and symmetrical throughout.   DIAGNOSTIC TESTS:  Cardiac catheterization performed by Dr. Gerri Spore today  is reviewed and notable for severe three vessel coronary artery disease as  well as 50% stenosis of the distal left main coronary artery.  The left  anterior descending coronary artery has 60 to 70% proximal stenosis.  There  is 80% proximal stenosis of a small first diagonal branch.  There is 95%  proximal stenosis of a large first circumflex marginal branch arising at its  bifurcation.  There is 100% proximal occlusion of the right coronary artery  with distal filling of the distal right coronary artery via right-to-right  and left-to-right collaterals.  There is normal left ventricular function  with ejection fraction estimated 60 to 65% with no significant wall motion  abnormalities.   IMPRESSION:  Severe three vessel coronary artery disease with normal left  ventricular function and class IV  symptoms of angina.  Mr. Henton would  probably best be treated by surgical revascularization.   PLAN:  I have outlined options at length with Mr. Nay and his family.  Alternative treatment strategies have been discussed.  They understand and  accept all associated risks of surgery, including but not limited to, risk  of death, stroke, myocardial infarction, congestive heart failure, bleeding  requiring blood transfusion, arrhythmia, infection, and recurrent coronary  artery disease.  They also understand that in the presence of sclerosis of  the aortic valve, we do not plan aortic valve replacement at the time of  surgery. We will evaluate the valve intraoperatively using transesophageal  echocardiogram, but it seems unlikely that any sort of intervention will be  necessary.  It is possible that he could develop progression of his aortic valve disease in the future.  All of his questions have been addressed.                                               Salvatore Decent. Cornelius Moras, M.D.    CHO/MEDQ  D:  10/03/2002  T:  10/04/2002  Job:  [id number]   cc:   Carole Binning, M.D. [facility]   Bruce H. Swords, M.D. [facility]

## 2010-05-29 NOTE — Assessment & Plan Note (Signed)
[facility]                                 ON-CALL NOTE   ORRY, SIGL                         MRN:          [id number]  DATE:11/13/2006                            DOB:          1924-07-30    PHONE NUMBER:  [phone].   CALLER:  Cala Bradford, the daughter.   OBJECTIVE:  The patient was given carvedilol for blood thinner and  heart.  Recently was increased by Dr. Cato Mulligan at two 12.5 mg tablets  twice a day.  By mistake, he took 4 tablets this morning and she is  concerned.  He did that at 8 o'clock this morning and is doing okay,  other than he has the stomach flu as well and has been in the bathroom  all night.  No terrible or unusual problems otherwise.   Objective is extra medication.   PLAN:  Probably will wear off; it is a 12-hour drug and should be gone  in 12 hours.  I would tell him to push fluids, monitor his pulse as much  as possible.  If he gets real low and he is symptomatic, probably needs  to be seen; but otherwise should do okay.   PRIMARY CARE PHYSICIAN:  Valetta Mole. Swords, MD, [place] [facility].     Arta Silence, MD  Electronically Signed    RNS/MedQ  DD: 11/13/2006  DT: 11/14/2006  Job #: [id number]   cc:   Valetta Mole. Swords, MD

## 2010-05-29 NOTE — Op Note (Signed)
NAME:  [name], [name]                            ACCOUNT NO.:  [id number]   MEDICAL RECORD NO.:  [id number]                   PATIENT TYPE:  OIB   LOCATION:  6523                                 FACILITY:  [facility]   PHYSICIAN:  Salvadore Farber, M.D. [facility]         DATE OF BIRTH:  April 01, 1924   DATE OF PROCEDURE:  04/24/2003  DATE OF DISCHARGE:                                 OPERATIVE REPORT   PROCEDURE:  Right subclavian angiography with imaging of the cerebral  vasculature, left subclavian stenting.   INDICATION:  Mr. Mechling is a 77 year old gentleman with coronary and  peripheral vascular disease.  He is status post coronary bypass grafting in  September of 2004.  Since coronary bypass grafting, he has had persistent  class II to III angina.  Cardiac catheterization performed March 10  demonstrated 80% stenosis of the left subclavian artery proximal to the take  off of the left internal mammary artery and competitive flow in the native  LAD.  The vein graft to the obtuse marginal was occluded.  Decision was made  to proceed with revascularization of the left subclavian artery with later  consideration of revascularization of the totally occluded native obtuse  marginal, depending upon his symptomatic response to the subclavian stent.   PROCEDURAL TECHNIQUE:  Informed consent was obtained.  The patient had been  previously loaded on Plavix and continued on aspirin.  Under 1% Lidocaine  local anesthesia, a 7 French sheath was placed in the right common femoral  artery using the modified Seldinger technique.  I initially performed  diagnostic angiography of the right subclavian artery to provide imaging of  the posterior cerebral circulation to establish the relative importance of  the left vertebral artery which was known from prior ultrasound to have  retrograde flow.  Therefore, over a Wholey wire, a JR-4 catheter was  advanced into the right subclavian artery.  Angiography was  performed by  hand injection with multiple imaging of the cerebral vessels in multiple  planes.  The catheter was then pulled back over the wire into the innominate  artery and angiography again performed by hand injection. These images  demonstrated a dominant right vertebral artery with filling of the entirety  of the posterior circulation and retrograde filling of the left vertebral.  There was competitive flow within the left vertebral artery.   The JR-4 catheter was then pulled back and used to selectively engage the  left subclavian artery.  The [facility] wire was advanced beyond the lesion  without difficulty.  The catheter was then advanced over this wire and  through the catheter wire exchanged for a long Rosen wire.  Catheter and  sheath were then removed and replaced with a 90 cm 7 French sheath, which  was advanced into the proximal portion of the left subclavian artery.  The  lesion was then directly stented using a 7.0 x 18 mm Genesis on  OptiPro at  12 atmospheres for 30 seconds.  Repeat angiography demonstrated the stent to  be relatively undersized compared to the vessel.  As expected, I therefore  proceeded with further angioplasty using a 10 x 20 mm Powerflex at 8  atmospheres for 30 seconds.  Repeat angiography after this demonstrated  complete apposition of the stent.  However, there was a nonflow-limiting  tear at the distal margin of the stent.  Because of the importance of this  vessel to his coronary circulation, I decided to perform further angioplasty  of the dissection.  The 7 mm balloon on which the stent had been deployed  was reintroduced and positioned at the distal margin of the stent so as to  cover the short dissection.  It was again inflated to 6 atmospheres for 45  seconds.  Repeat angiography demonstrated marked improvement in the  dissection flap.  There was no dye staining and no limitation to flow.  Flow  within the left internal mammary artery remained  normal.  The vertebral  remained patent, though severely diseased as it was prior to stenting.   Finally, the Rosen wire was exchanged via the sheath dilator for a Wholey  wire.  The sheath was then removed over this wire and replaced with a short  7 [place] sheath.  The patient tolerated the procedure well and was  transferred to the holding room in stable condition.  The sheaths were  removed when the ACT is less than 175 seconds.   COMPLICATIONS:  None.   FINDINGS:  1. Normal innominate and right subclavian artery.  2. Large right vertebral artery with retrograde filling of the left     vertebral artery.  3. 80% left subclavian stenosis, treated with stenting as described above,     resulting in no residual stenosis and preservation of normal flow within     the subclavian and left internal mammary artery.   IMPRESSION/RECOMMENDATIONS:  Successful stenting of the left subclavian  artery with excellent angiographic result.  Will plan continued Plavix for  an additional 30 days, aspirin will be continued indefinitely.                                               Salvadore Farber, M.D. [facility]    WED/MEDQ  D:  04/24/2003  T:  04/25/2003  Job:  [id number]   cc:   Valetta Mole. Swords, M.D. [facility]   Bea Laura Graceann Congress, M.D.

## 2010-05-29 NOTE — Discharge Summary (Signed)
NAME:  [name], [name]                            ACCOUNT NO.:  [id number]   MEDICAL RECORD NO.:  [id number]                   PATIENT TYPE:  INP   LOCATION:  2016                                 FACILITY:  [facility]   PHYSICIAN:  Salvatore Decent. Cornelius Moras, M.D.              DATE OF BIRTH:  [birth year]   DATE OF ADMISSION:  10/02/2002  DATE OF DISCHARGE:  10/10/2002                                 DISCHARGE SUMMARY   HISTORY OF PRESENT ILLNESS:  Mr. [name] is a 77 year old gentleman who has a  history of coronary artery disease dating back approximately 25 years ago,  at which time he sustained a mild myocardial infarction.  At that time  reportedly he had a catheterization that showed only minor blockages.  Since  then the patient has had stable angina, for which he would take occasional  sublingual nitroglycerin.  Over the past six months, however, the patient  developed progressive symptoms to almost daily and required a much more  frequent use of nitroglycerin.  The pain would occur at both activity and at  rest.  Approximately three weeks prior to the admission he developed a  syncopal episode and was evaluated by Dr. Cato Chambers and subsequently cardiology  consultation with Dr. Corinda Chambers was obtained.  A 2 D echocardiogram performed  September 14, 2002, revealed mild dilatation of the left ventricle with  overall left ventricular function appearing normal.  Ejection fraction was  estimated a 60%.  There was only mild aortic regurgitation with no aortic  valve stenosis.  Subsequently a Cardiolite examination was found to be  clinically nondiagnostic but the patient had EKG changes consistent with  ischemia, particularly in the inferior distribution.  He was therefore  admitted to the hospital for cardiac catheterization, further evaluation,  and treatment depending on findings.   MEDICATIONS PRIOR TO ADMISSION:  1. Aspirin 325 mg daily.  2. Salicylate 500 mg twice daily.  3. Nitroglycerin patch 0.2  mg daily.  4. Fosinopril 40 mg daily.  5. Isosorbide 30 mg twice daily.  6. Atenolol 12.5 mg twice daily.  7. Zocor 40 mg once daily.  8. Terazosin 3 mg once daily.  9. Quinine tablets p.r.n. for leg night cramps.  10.      Latanoprost eye drops 0.005% each eye for glaucoma.  11.      Sublingual nitroglycerin as needed.   ALLERGIES:  No known drug allergies.   PAST MEDICAL HISTORY:  1. Coronary artery disease with possible myocardial infarction in the     distant past.  2. Hyperlipidemia.  3. Remote tobacco use, although he quit smoking 10 years ago.   PAST SURGICAL HISTORY:  1. Elbow surgery.  2. Knee surgery.  3. Appendectomy.  4. Two previous hemorrhoidectomies.  5. The patient has had multiple broken bones in the past.   Family history, social history, review of systems, and physical exam:  Please  see the history and physical done at the time of admission.   HOSPITAL COURSE:  The patient was admitted for elective cardiac  catheterization, which was performed by Carole Binning, M.D., on  October 03, 2002.  Impression included normal left ventricular systolic  function, left renal artery stenosis, and three-vessel coronary artery  disease.  He was subsequently referred to [facility]. Cornelius Moras, M.D., for  surgical opinion, and Dr. Cornelius Moras agreed with the plans to proceed with  surgical revascularization.  Procedure:  On October 05, 2002, the patient  was taken to the cardiac operating room, where he underwent the following  procedure:  Coronary artery bypass grafting x4 with the left internal  mammary artery to the distal left anterior descending coronary artery, a  saphenous vein graft to the first circumflex marginal branch and a  sequential saphenous vein graft to the second circumflex marginal branch,  and finally a saphenous vein graft to the acute marginal branch.  The  patient tolerated the procedure well, was taken to the surgical intensive  care unit in stable  condition.  Postoperative hospital course:  The patient  has done quite well postoperatively.  His intensive care unit stay was  unremarkable.  All routine lines, monitors, and drainage devices were  discontinued in a standard fashion.  The patient was extubated without  difficulty.  He did have an initial air leak in his Pleuravac system, but  this dissipated and his chest tubes, as stated, were able to be  discontinued.  The patient did have a short episode of atrial fibrillation,  but this normalized as the patient was started on amiodarone.  The patient  has tolerated a routine advancement in activities commensurate with the  level of postoperative convalescence.  He is tolerating diet and his  incisions are healing well without signs of infection.  He has had some  increased requirement of oxygen, but it has been slowly weaned and he is  maintaining adequate saturations on room air.  He has been placed on a  Combivent inhaler and showed good clinical improvement with routine  pulmonary toilet including incentive spirometry, cough, and deep breathing.  Currently the patient is felt to be doing well.  Tentatively he is scheduled  for discharge on the morning of October 10, 2002, pending morning round re-  evaluation.   DISCHARGE MEDICATIONS:  1. Aspirin 325 mg daily.  2. Toprol XL 25 mg daily.  3. Xalatan 0.005% eye drops continued use as before.  4. Lasix 40 mg daily for five days.  5. K-Dur 20 mEq daily for five days.  6. Hytrin 3 mg at bedtime as before.  7. Zocor 20 mg daily.  8. Amiodarone 400 mg twice a day for the next five days, then 400 mg daily.  9. Combivent inhaler two puffs four times daily.  10.      Humibid 600 mg twice daily.   INSTRUCTIONS:  The patient received written instructions in regard to  medications, activity, diet, wound care, and follow-up.  Follow-up will include Dr. Cornelius Moras Monday, October 25, at 11 a.m.  Additionally, his  cardiologist's office will  arrange for a follow-up appointment to see Dr.  Corinda Chambers in two weeks.   CONDITION ON DISCHARGE:  Stable and improving.   FINAL DIAGNOSES:  1. Severe three-vessel coronary artery disease as delineated above, now     status post surgical revascularization.  2. Dyslipidemia.  3. Remote tobacco use.  4. Current cardiac catheterization finding that includes a  left renal artery     stenosis of 80%.   LABORATORY DATA:  Most recent laboratories reveal the following:  On  October 08, 2002, sodium 136, potassium 4.4, chloride 104, carbon dioxide  26, glucose 127, BUN 29, creatinine 1.4.  White blood cell count 11.2,  hemoglobin 9.4, hematocrit 27.1, platelet count 122,000.      Rowe Clack, P.A.-C.                    Salvatore Decent. Cornelius Moras, M.D.    Sherryll Burger  D:  10/09/2002  T:  10/10/2002  Job:  [id number]   cc:   Carole Binning, M.D. [facility]   Bea Laura Graceann Congress, M.D.   Bruce Rexene Edison Swords, M.D. [facility]

## 2010-05-29 NOTE — Assessment & Plan Note (Signed)
[facility]                              CARDIOLOGY OFFICE NOTE   GIULIANO, PREECE                         MRN:          [id number]  DATE:10/14/2005                            DOB:          October 06, 1924    PRIMARY CARE PHYSICIAN:  Birdie Sons, M.D.   HISTORY OF PRESENT ILLNESS:  Mr. Liz is a 77 year old gentleman with  coronary and lower extremity vascular disease as well as asymptomatic  cerebrovascular disease. He is status post coronary artery bypass grafting  in 2004. I subsequently stented a severe stenosis in his left subclavian  artery, which was limiting flow to his left anterior mammary artery graft  and thus, causing angina. He now has only occasional angina, which is due to  a totally occluded non-bypass obtuse marginal. He says that his angina  bothers him primarily first thing in the morning and right before bed. It is  usually relieved with a single sublingual nitroglycerin. This has not  changed in more than a year.   On Saturday, he became very upset, after thinking that his daughter had been  in a car accident. It turned out that she was not involved in an accident at  all. Nonetheless, he had several hours of chest discomfort. He says that he  chose not to call 911 because he had no one to care for his dogs. He has  been active since then without any dyspnea or recurrent chest discomfort. He  has had no orthopnea, PND, edema, or symptoms concerning for TIA or stroke.  He has very minimally lifestyle limiting right leg claudication without rest  pain.   CURRENT MEDICATIONS:  1. Imdur 60 mg daily.  2. Lisinopril 40 mg daily.  3. Lasix 40 mg daily.  4. Vytorin 10/40 one daily.  5. K-Dur 20 meq daily.  6. Terazosin 1 mg daily.  7. Diclofenac 50 mg each evening.  8. Azopt eye drop.  9. Lumigan eye drops.  10.Aspirin 81 mg daily.  11.Nitroglycerin sublingual.   PHYSICAL EXAMINATION:  GENERAL:  Generally well appearing. No  acute  distress.  VITAL SIGNS:  Heart rate 63, blood pressure 116/78 and equal bilaterally.  Weight 172 pounds. Blood pressure equal in both arms.  NECK:  No jugular venous distention. No thyromegaly. No cervical,  supraclavicular, or axillary lymphadenopathy. Scars are consistent with  bilateral carotid endarterectomies.  LUNGS:  Respiratory effort normal. Clear to auscultation. He has a non-  displaced point of maximal cardiac impulse.  HEART:  Regular rate and rhythm with a 2 over 6 short systolic ejection  murmur at the right upper sternal border without radiation. There is no S3.  ABDOMEN:  Soft, nontender, and nondistended. No hepatosplenomegaly. Bowel  sounds normal. No abdominal bruit or pulsatile mass.  EXTREMITIES:  Warm without clubbing, cyanosis, or edema. No ulcerations.  Diffuse varicosities in both legs.   LABORATORY DATA:  Electrocardiogram demonstrates normal sinus rhythm with  inferior T wave inversions. No significant change when compared with April  2, 207.   IMPRESSION/PLAN:  1. Episode of chest discomfort:  Concerning  for prolonged ischemia. Will      check troponin today. Fortunately, EKG is unchanged and he has had no      further symptoms. Continue aspirin and ace inhibitor. We have      previously not used beta blocker because of low resting heart rate.      However, with fairly frequent angina, will try and add this today.      Initiate Coreg 3.125 mg twice daily.  2. Cerebrovascular disease:  Due for repeat carotid duplex. Will schedule      this today.  3. Left subclavian stenosis:  Blood pressure is equal in both arms and by      continued patency.  4. Left renal artery stenosis:  Blood pressure is well controlled.      Creatinine was 1.6 in October 2006. Will recheck today.  5. Hypercholesterolemia:  Managed by Dr. Cato Mulligan. Goal LDL less than 70.       Salvadore Farber, MD     WED/MedQ  DD:  10/14/2005  DT:  10/15/2005  Job #:  [id number]    cc:   Valetta Mole. Swords, MD

## 2010-05-29 NOTE — Discharge Summary (Signed)
NAMEGAREN, [name]                  ACCOUNT NO.:  [id number]   MEDICAL RECORD NO.:  [id number]           PATIENT TYPE:   LOCATION:                                 FACILITY:   PHYSICIAN:  Balinda Quails, M.D.    DATE OF BIRTH:  May 23, 1924   DATE OF ADMISSION:  DATE OF DISCHARGE:                                 DISCHARGE SUMMARY   PRIMARY DIAGNOSIS:  Right internal carotid artery stenosis.   IN HOSPITAL DIAGNOSIS:  Hypotension.   SECONDARY DIAGNOSES:  1.  History of transient ischemic attacks with symptomatic left ICA stenosis      with nondisabling left hemispheric cerebrovascular accident.  2.  History of myocardial infarction.  3.  Hypertension.  4.  Dyslipidemia.  5.  History of left elbow dislocation.  6.  History of glaucoma.  7.  History of 50+ broken bones.   ALLERGIES:  No known drug allergies.   IN HOSPITAL OPERATIONS/PROCEDURES:  Right carotid endarterectomy with Dacron  patch angioplasty.   HISTORY AND PHYSICAL AND HOSPITAL COURSE:  The patient is a 77 year old male  who was admitted to [facility] in September due to experiencing a  nondisabling left hemispheric cerebrovascular accident.  At that time the  patient had bilateral carotid duplex ultrasound done which showed greater  than 80% on the right internal carotid artery stenosis and left being 60-80%  ICA stenosis with bilateral vertebral artery flow antegrade.  At that time,  due to the patient having symptoms coming from the left ICA stenosis it was  felt his left stenosis should be operated prior to this right.  The patient  underwent left carotid endarterectomy August 11, 2004.  The patient tolerated  that procedure well and was discharged home on postoperative day #1.  He was  then followed up in the office to discuss undergoing right carotid  endarterectomy.  The patient has denied any symptoms following surgery.  He  discussed with Dr. Madilyn Chambers the risk and benefits of undergoing right carotid  endarterectomy.  The patient acknowledges understanding.  Surgery was  scheduled for 10/05/2004.   The patient was taken to the operating room on 10/05/2004 where he underwent  right carotid endarterectomy with Dacron graft angioplasty.  The patient  tolerated this procedure well and was transferred to the intensive care unit  in stable condition.  Postoperatively the patient was seen to be hypotensive  and started on a dopamine drip.  His neurologic was intact and he was  hemodynamically stable.   On postop day #1 the patient was still hypotensive and still required the  dopamine drip.  He was out of bed and ambulating well.  Incisions and  drainage have been healing well.  The patient was in normal sinus rhythm.  Neurologic remained intact and patient was hemodynamically stable.  Dopamine  drip was able to be weaned off in the evening of postoperative day #1.  The  patient tolerated well.  Systolic blood pressure remained greater than 100.  The patient was able to be weaned off oxygen saturating at greater than 90%  prior to discharge.   The patient was discharged to home on postoperative day #2 on 10/10/2004 in  stable condition.  A followup appointment was scheduled by Dr. Madilyn Chambers for  10/26/2004 at 3:20 p.m.  Mr. Cheek received instructions on diet, activity,  and incisional care.  He was told no driving until he was released to do so,  no heavy lifting over 10 pounds.  He is told 3 or 4 times per day  progressing as tolerated and he is to continue his breathing exercises.  The  patient is told to utilize shower using soap and water to wash his  incisions.  He is to contact the office if he develops any drainage or  openings in any of his incision sites.  The patient was educated on diet to  be low fat, low salt.   DISCHARGE MEDICATIONS:  1.  Tylox 1 mL tabs p.o. q.4-6 h. p.r.n. pain.  2.  Terazosin 3 mg at night.  3.  Plavix 75 mg daily.  4.  Vytorin 10/40 daily.  5.  Lasix 40  mg daily.  6.  Isosorbide mononitrate 60 mg daily.  7.  Aspirin 325 mg daily.  8.  Lisinopril 40 mg daily.  9.  Potassium chloride 20 mEq daily.  10. Lumigan drop at night.  11. Isopto drops 1 drop in the morning and 1 drop at night, both eyes.      [name] Acre Dominick, PA      P. [name], M.D.  Electronically Signed    KMD/MEDQ  D:  11/24/2004  T:  11/25/2004  Job:  [id number]

## 2010-05-29 NOTE — Discharge Summary (Signed)
NAME:  GEOFREY, SILLIMAN                            ACCOUNT NO.:  [id number]   MEDICAL RECORD NO.:  [id number]                   PATIENT TYPE:  OIB   LOCATION:  6531                                 FACILITY:  [facility]   PHYSICIAN:  Salvadore Farber, M.D. [facility]         DATE OF BIRTH:  [birth year]   DATE OF ADMISSION:  07/26/2003  DATE OF DISCHARGE:  07/27/2003                                 DISCHARGE SUMMARY   DISCHARGE DIAGNOSES:  1. Admitted for angiography concerning right internal carotid artery     stenosis, critical value by ultrasound.  2. Claudication at 100 yards.  3. Study finding 60% right internal carotid artery stenosis.  4. A long occlusion of the right superficial femoral artery with single     vessel run-off to the peroneal artery.  5. Diffuse left superficial femoral artery arterial disease with single     vessel run-off to the peroneal artery.  6. Left subclavian stent is patent.  7. A 60% left renal artery stenosis.   SECONDARY DIAGNOSES:  1. History of severe three vessel coronary artery disease, status post     coronary artery bypass graft surgery in September 2004.  2. Left heart catheterization on March 21, 2003.  This study shows an 80%     left subclavian stenosis just proximal to the left internal mammary     artery take-off, and also an occlusion of a sequential saphenous vein     graft to both limbs of the first obtuse marginal.  3. The patient is status post stenting of the left subclavian artery in     April 2005.  4. Dyslipidemia.  5. Remote tobacco habituation.   PROCEDURES:  July 26, 2003:  Bilateral carotid angiography, left subclavian  angiography, abdominal angiography with bilateral lower extremity run-off  with selective right superficial femoral artery angiography.  This study  showed a 60% right internal carotid artery stenosis, a long total occlusion  of the right superficial femoral artery, failed attempt at revascularization  of the right  superficial femoral artery, single vessel run-off bilaterally  from superficial femoral arteries through the peroneal arteries bilaterally,  a 60% renal artery stenosis.   HISTORY:  Mr. Rehberg is a 77 year old gentleman.  He has a history of  coronary artery disease and also infrainguinal arterial occlusive disease.  He is status post coronary artery bypass graft surgery September 2004.  He  has had persistent angina.  This was found to be due to a severe stenosis of  the proximal left subclavian artery, limited flow to the LIMA.  This was  stented in April 2005, with marked improvement in his angina symptoms.  He  does occasional have angina at bedtime.  This is relieved with a single dose  of sublingual nitroglycerin.  He also has a known occlusion of the  sequential saphenous vein graft to two branches of the obtuse marginal.  He  also continues  to complain of bilateral calf claudication at less than 100  yards.  This has been substantially lifestyle limiting.  Ankle brachial  indexes taken in January 2005, were 0.58 on the right, and 0.73 on the left.  The right superficial femoral artery is occlude at the mid-portion with  reconstitution in the proximal popliteal artery.  The left common femoral  artery and superficial femoral arteries are diffusely diseased with severe  stenosis of the mid superficial femoral artery.  He has had no rest pain or  ulcerations of the lower extremities.  Currently, he has had no history of  cerebrovascular accident or transient ischemic attack.  Screening ultrasound  recently demonstrated substantial progression of previously known right  internal carotid artery stenosis.  He also has known left renal artery  stenosis with impaired GFR, creatinine 1.7.  With regard to increased  velocities in the carotid arteries, Dr. Samule Ohm recommends angiography to  better clarify the degree of stenosis.  He will be scheduled for carotid  angiography on July 26, 2003.   HOSPITAL COURSE:  Mr. Hoback was admitted on July 26, 2003, for elective  carotid angiography, left subclavian angiography, and abdominal angiography  with bilateral lower extremity run-off.  An attempted revascularization of  stenosis in the right superficial femoral artery was unsuccessful, and the  patient will continue medical therapy.  His postoperative creatinine on the  morning of July 27, 2003, was 1.7.  The patient has had no problems after  the procedure.  His left lower extremity is well perfused.  He has no bruit  at the groin, although it is quite ecchymotic, it is soft and nontender.  The patient will go home with the medications he was taking prior to the  procedure except for Toprol which is on hold for bradycardia.   DISCHARGE MEDICATIONS:  1. Monopril 40 mg daily.  2. Lasix 40 mg daily.  3. Vitorin 10/40 mg daily at bedtime.  4. Potassium chloride 20 mEq daily.  5. Terazosin 1 mg q.a.m. and 3 mg q.p.m.  6. Imdur 60 mg daily.  7. Aspirin 325 mg daily.  8. Plavix 75 mg daily.  9. Nitroglycerin 0.4 mg one tab under the tongue every 5 minutes x3 doses as     needed for chest pain.  10.      Eye drops as before the procedure.  11.      Tylenol 325 mg one to two tabs q.4-6h. p.r.n. pain at the left     groin site.   ACTIVITY:  He is to avoid heavy lifting for the next two weeks.  He may  drive beginning Monday, July 29, 2003.   DIET:  He is to pursue his low sodium, low cholesterol diet.   WOUND CARE:  He may shower.  He is to call [phone] if he experiences pain  or swelling at the catheterization site.   FOLLOWUP:  He has an office visit with Dr. Samule Ohm in two weeks.  Dr.  Melinda Crutch office will call to make that appointment.      Maple Mirza, P.A.                    Salvadore Farber, M.D. [facility]    GM/MEDQ  D:  07/27/2003  T:  07/27/2003  Job:  [id number]   cc:   Larina Earthly, M.D.  [address]  [place]  [place] [address]  Valetta Mole. Swords, M.D.  [facility]

## 2010-05-29 NOTE — Op Note (Signed)
NAME:  [name], [name]                            ACCOUNT NO.:  [id number]   MEDICAL RECORD NO.:  [id number]                   PATIENT TYPE:  INP   LOCATION:  2310                                 FACILITY:  [facility]   PHYSICIAN:  Zenon Mayo, MD            DATE OF BIRTH:  Jun 07, 1924   DATE OF PROCEDURE:  10/05/2002  DATE OF DISCHARGE:                                 OPERATIVE REPORT   PROCEDURE:  Transesophageal echocardiogram.   INDICATIONS FOR PROCEDURE:  Mr. Gum is a 77 year old male who was  admitted with chest pain and was scheduled to undergo coronary artery bypass  surgery on October 05, 2002. He had also had an episode of syncope and a  transesophageal echocardiogram was requested to further evaluate his cardiac  anatomy and function.   DESCRIPTION OF PROCEDURE:  After informed consent was obtained the patient  was brought to the operating room and placed under general anesthesia. After  endotracheal tube position  was confirmed, an orogastric tube was placed  into the stomach and the stomach  contents were removed. A transesophageal  echocardiogram probe was then placed into the patient's esophagus to  evaluate cardiac  function and anatomy. The findings of the examination are  this:   1. The left ventricle was found to have an ejection fraction estimated to be     approximately  60%. The left ventricular wall thickness was of normal     size and there were no wall motion abnormalities noted.  2. The mitral valve was normal in appearance with no calcifications noted.     There was no mitral regurgitation noted either.  3. The aortic valve was trileaflet in nature. There was aortic sclerosis     noted. The aortic valve area was 2.2 cm squared measured by planimeter.     No aortic stenosis was noted, however, there was some mild aortic     regurgitation noted. Pressure half time was measured to evaluate the     significance of the aortic regurgitation which ended,  and the measurement     was 643 milliseconds, thus reflecting mild aortic regurgitation.  4. The tricuspid and pulmonic valve leaflets were normal in appearance with     no regurgitation or stenosis noted.  5. The thoracic aorta revealed  mild atherosclerosis.  6. There were no pericardial or pleural effusions noted.   At the completion of the bypass, the heart was further  evaluated. The LV  function was well preserved after cardiopulmonary bypass with no wall motion  abnormalities noted. The aortic cannula was removed and no aortic dissection  was noted. The anatomy and the function of the aortic valve was as discussed  previously on pre bypass.   After the chest was closed the transesophageal echocardiogram probe was  removed from the patient's mouth atraumatically. The patient was taken to  the intensive care unit intubated and  in stable condition.                                               Zenon Mayo, MD    WEF/MEDQ  D:  10/05/2002  T:  10/07/2002  Job:  [id number]   cc:   Anesthesia Office

## 2010-05-29 NOTE — Consult Note (Signed)
[name] NO.:  Chambers   MEDICAL RECORD NO.:  Chambers          PATIENT TYPE:  INP   LOCATION:  4711                         FACILITY:  [facility]   PHYSICIAN:  [name], M.D. LHCDATE OF BIRTH:  09/10/1924   DATE OF CONSULTATION:  07/08/2004  DATE OF DISCHARGE:                                   CONSULTATION   PRIMARY TEAM:  Stroke team   PRIMARY CARDIOLOGIST:  [name]   PATIENT PROFILE:  A 77 year old male with history of CAD who presented with  right-sided weakness and was admitted to the stroke team.   PROBLEM LIST:  1.  Cerebrovascular accident.      1.  July 06, 2004 head CT was negative for acute process.      2.  July 07, 2004 MRI/MRA of the head showing two or three small acute          infarcts on the left parietal cortex.  Small caliber right internal          carotid artery with moderate to severe stenosis.      3.  Carotid ultrasound July 07, 2004:  Greater than 80% stenosis in the          right internal carotid artery, 60-80% stenosis in the left internal          carotid artery.  Antegrade vertebral flow bilaterally.  2.  Coronary artery disease.      1.  Status post CABG September 2004.  Left internal mammary artery to          left anterior descending, vein graft to obtuse marginal 1, vein          graft to obtuse marginal 2 and acute marginal.  3.  Peripheral vascular disease.      1.  Status post PTA of the left subclavian proximal to the left internal          mammary artery with a 7 x 18 mm Genesis stent placed April 24, 2003.      2.  May 26, 2003 angiography revealing 60% right internal carotid artery          stenosis, 40% left internal carotid artery stenosis, 50% left common          carotid artery stenosis, 60% left renal artery stenosis, 70% left          SFA stenosis with attempt at unsuccessful PTA.  4.  Hypertension.  5.  Hyperlipidemia.  6.  Glaucoma.   HISTORY OF PRESENT ILLNESS:  A  77 year old white male with history of CAD,  CVD, PAD who presented July 06, 2004 to [facility] with 15-minute history of  right hand weakness and numbness.  Head CT showed no acute abnormalities and  resolved spontaneously.  He did not receive lytics.  He was admitted to the  stroke service for further evaluation.  MRI/MRA of the head showed two to  three small acute infarcts in the left parietal cortex with right internal  carotid artery stenosis.  Follow-up carotid ultrasound showed greater  than  80% right internal carotid artery stenosis with 60-80% left internal carotid  artery stenosis with antegrade vertebral flow bilaterally.  Cardiology was  asked to consult to determine preoperative left CEA risk.  Patient has had  approximately weekly discomfort in his upper chest, throat, and jaw pain  without associated symptoms lasting approximately 10 seconds and relieved  with one sublingual nitroglycerin.  He states that these symptoms have been  ongoing for about 75 years and have not changed in frequency.  His previous  anginal equivalent was flu-like symptoms which are not currently present.  He does not usually get dyspnea on exertion unless he is really pushing  himself.  His activities are more limited by gout than anything else.  He  denies orthopnea, paroxysmal nocturnal dyspnea, and edema   ALLERGIES:  No known drug allergies.   CURRENT MEDICATIONS:  1.  Plavix 75 mg daily.  2.  Lasix 40 mg daily.  3.  Imdur 60 mg daily.  4.  K-Dur 20 mEq daily.  5.  Hytrin 1 mg daily.  6.  Zetia 10 mg daily.  7.  Zocor 40 mg q.h.s.  8.  Lisinopril 40 mg daily.  9.  Aspirin 325 mg daily.  10. Lumigan one drop OU p.r.n.   FAMILY HISTORY:  His mother is alive at age 65.  He is not really sure about  her history as he does not talk to her.  Father died at age 34 of a stroke.   SOCIAL HISTORY:  Lives in [place] now by himself.  He is married but his  wife is in assisted living with  Alzheimer's disease.  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  He says it is  pretty low exertion job.  He smoked 40 years at a pack a day and quit in  1990.  Denies any alcohol or drugs.  He does not routinely exercise outside  what he does at work.  He is able to climb two flights of stairs without  limitation.   REVIEW OF SYSTEMS:  Positive for the chest discomfort which has been stable  and present for approximately 20 years.  He does have bilateral foot pain  which he attributes to gout.  All other systems reviewed and negative.   PHYSICAL EXAMINATION:  VITAL SIGNS:  Temperature 97, heart rate 46,  respirations 16, blood pressure 97/46, pulse ox 96% on room air.  GENERAL:  Pleasant white male in no acute distress.  Awake, alert, and  oriented x3.  NECK:  No bruits, JVD.  LUNGS:  Respirations regular, unlabored, clear to auscultation.  CARDIAC:  Regular S1, S2 with 2/6 systolic murmur loudest at left lower  sternal border and apex.  ABDOMEN:  Round, soft, nontender.  Bowel sounds present x4.  EXTREMITIES:  Warm, dry, pink.  No clubbing, cyanosis, edema.  Dorsalis  pedis, posterior tibial pulses 1+ and equal bilaterally.   Hemoglobin 13.7, hematocrit 39.2, WBC 7.6, platelets 211.  Sodium 135,  potassium 4.2, chloride 104, CO2 21, BUN 43, creatinine 1.8, glucose 105.  Total bilirubin 1, alkaline phosphatase 72, AST 19, ALT 26.  Total  cholesterol 110, triglycerides 96, HDL 35, LDL 56.  Hemoglobin A1C 6.1.  Calcium 8.5.  PTT 28,  PT 40.3, INR 1.1.   ASSESSMENT/PLAN:  1.  Status post cerebrovascular accident/cerebrovascular disease.  Plan per      neurology for outpatient left cerebrovascular accident for symptomatic  left internal carotid artery stenosis 60-80%.  Would recommend      continuation of aspirin, Statin, and Plavix if seems feasible by the     primary team.  Patient is felt to be low risk from a cardiac standpoint.  2.  Coronary  artery disease.  Patient has very stable symptoms for      approximately 20 years.  It is unclear if this is even truly angina.  He      is low risk for left carotid endarterectomy as above.  He is not      currently on a beta blocker secondary to baseline bradycardia.      Otherwise, he remains on ACE inhibitor, aspirin, Statin, and Plavix.  3.  Hypertension.  This is under excellent control and his pressure today is      97/46.  4.  Hyperlipidemia.  Will continue statin therapy.  5.  Bradycardia.  That prevents [place] from putting put him on beta blocker      perioperatively.  6.  Renal insufficiency.  His creatinine is 1.8 on admission.       CRB/MEDQ  D:  07/08/2004  T:  07/08/2004  Job:  [id number]

## 2010-05-29 NOTE — Op Note (Signed)
NAME:  [name], [name]                            ACCOUNT NO.:  [id number]   MEDICAL RECORD NO.:  [id number]                   PATIENT TYPE:  OIB   LOCATION:  6531                                 FACILITY:  [facility]   PHYSICIAN:  Salvadore Farber, M.D. [facility]         DATE OF BIRTH:  07-18-1924   DATE OF PROCEDURE:  07/26/2003  DATE OF DISCHARGE:                                 OPERATIVE REPORT   PROCEDURE:  Selected bilateral carotid angiography with intracerebral  imaging, left subclavian angiography, abdominal aortography with bilateral  lower extremity runoff, selective right superficial femoral artery  angiography, failed attempt at revascularization of the right superficial  femoral artery.   INDICATIONS:  Mr. Ouch is a 77 year old gentleman with longstanding  coronary and peripheral vascular disease.  He is status post stenting of the  left subclavian artery for relief of angina due to his LIMA artery  approximately six months ago.  With relief of his angina, he has become  troubled by lifestyle intermittent claudication.  In January, ABIs were 0.5,  8 on the right and 0.73 on the left.  He has had no rest pain or ulceration.   PROCTOR:  Di Kindle. Edilia Bo, M.D.   In addition, he has had progression of known right internal carotid artery  stenosis followed by serial ultrasounds.  The velocities are now 257/87.  He  is referred for diagnostic cerebral angiography to assess his severity of  the stenosis in the right carotid as well as abdominal aortography and lower  extremity angiography with percutaneous revascularization of his right SFA.   PROCEDURAL TECHNIQUE:  Informed consent was obtained.  Under 1% lidocaine  local anesthesia, a 5 French sheath was placed in the left common femoral  artery using the modified Seldinger technique.  A pigtail catheter was  advanced to the aortic arch.  Arch aortography was performed by power  injection.  The pigtail catheter was then pulled  back to the superior renal  abdominal aorta.  Abdominal aortography was performed by power injection.  The catheter was then further pulled back to the distal portion of the  abdominal aorta.  Abdominal aortography with bilateral lower extremity  runoff to the feet was performed by power injection, using step-table  technique.  There was only modest flow in the right leg with difficulty  visualizing the area of reconstitution distal to the SFA occlusion.  Therefore, selective angiography was performed.  A LIMA catheter was  advanced over a Y and used to cannulate the left common iliac artery.  The Y  was advanced into the common femoral artery.  Angiography was performed by  hand injection with runoff to the foot.   The case was then turned to cerebral angiography.  A 5 [place] V tech  catheter was used to selectively engage the innominate.  Angiography was  performed by hand injection.  A Wholey wire was then advanced into the  proximal portion of the right common carotid artery.  Angiography was again  performed by hand injection.  This included intracerebral views.  The left  common carotid was then cannulated using an H1 catheter.  Angiography was  performed by hand injection in multiple views, including intracerebral  imaging.  The V-tech catheter was then introduced into the left subclavian  artery.  Angiography was performed by hand injection.   The case then returned to the lower extremities.  Using LIMA catheter, the  Wholey wire was advanced through the left common iliac into the proximal  portion of the left SFA.  The catheter and 5 French sheath were removed and  replaced with a 6 [place] Terumo guide sheath.  This was advanced into the  proximal portion of the left SFA.  Angiography was performed by hand  injection.  Heparin 6500 units was administered.  I then proceeded with an  attempt at revascularization of the right SFA.  A glide wire was advanced  across the proximal  portion of the occlusion.  Multiple attempts using  angled glide wires, both intermediate stiffness and floppy, as well as stiff  wire and a 014 [employer] 4.5 wire were all unsuccessful in reentering  the true lumen distally.  The catheter was able to be advanced beyond the  stenosis into a dissection, which communicated with the true lumen with  excellent blood return; however, no wire could be advanced into the true  lumen.  Attempts were then abandoned.  Repeat angiography at an end hole  catheter position in the distal portion of the right SFA was performed.  This demonstrated continued patency of the popliteal and peroneal with  runoff to the foot, as was the case prior to the attempted intervention.  Catheter and sheath were then removed.  The sheath was replaced with a 7  [place] short sheath.  The patient tolerated the procedure well and was  transferred to the holding room in stable condition.  Sheaths will be  removed when the ACT is less than 150 seconds.   COMPLICATIONS:  None.   FINDINGS:  1. Aortic arch, type 2 arch with normal arch into the great vessels.  No     evident atheroma.  2. Innominate artery 30% proximal stenosis.  3. Right carotid:  Normal common carotid.  There is a normal proximal and     mid common carotid.  The distal common has plaque which extends into the     bulb.  There is approximately 30% stenosis.  The external carotid is     normal.  The internal carotid has a 60% stenosis just distal to the bulb.  4. Left carotid:  Proximal and mid common carotid is normal.  The distal has     a 50% stenosis extending into the bulb.  The internal carotid has     approximately 40% stenosis in the bulb, which is a continuation of the     plaque in the common.  The external is normal.  5. Left subclavian:  Stent is widely patent.  The flow in the LIMA is     excellent. 6. Intracerebral images to be interpreted by neuro radiology.  7. Abdominal aorta:  Single  renal arteries bilaterally.  The abdominal aorta     is tortuous without significant stenosis.  There is approximately 60%     stenosis of the left renal artery.  No significant stenosis of the right     renal artery.  8.  Left leg:  Normal common, external, and internal iliacs.  The common     femoral and profunda are widely patent.  The SFA has a 70% stenosis at     the __________ canal and the proximal popliteal has an 80% stenosis.     There is single-vessel runoff to the foot via the peroneal.  There is a     60% stenosis of the proximal peroneal.  The peroneal is a large vessel.  9. Right leg:  Stenosis of 20% of the distal common iliac artery.  The     external and internal are without significant disease.  The profunda is     without significant disease.  It supplies robust collaterals to the     popliteal.  The SFA is occluded over a segment of approximately 13 cm.     It is then diffusely diseased downstream with 60% stenosis in the     proximal and mid popliteal.  The anterior tibial and posterior tibial are     both occluded.  There is single-vessel runoff to the foot via the     peroneal.  There is an 80% stenosis within the tibial peroneal trunk.   IMPRESSION/RECOMMENDATIONS:  1. Cerebrovascular disease:  Moderate right and mild left internal carotid     artery stenosis.  Recommend continued conservative management with close     followup with serial ultrasounds for surveillance.  2. Claudication:  Unsuccessful revascularization of the right superficial     femoral artery.  Continues to have fairly robust collaterals.  Will     continue with conservative management, including continued exercise     protocol.  3. Patient was markedly bradycardic during the procedure.  Will discontinue     beta blocker.  4. Continue sodium bicarbonate protocol for prevention of contrast     nephropathy.                                               Salvadore Farber, M.D. [facility]     WED/MEDQ  D:  07/26/2003  T:  07/26/2003  Job:  [id number]   cc:   Valetta Mole. Swords, M.D. [facility]

## 2010-06-01 ENCOUNTER — Ambulatory Visit (INDEPENDENT_AMBULATORY_CARE_PROVIDER_SITE_OTHER): Payer: Medicare Other | Admitting: Internal Medicine

## 2010-06-01 VITALS — BP 130/84

## 2010-06-01 DIAGNOSIS — R300 Dysuria: Secondary | ICD-10-CM

## 2010-06-01 NOTE — Progress Notes (Signed)
  Subjective:    Patient ID: MAESON PUROHIT, male    DOB: 1924/06/05, 77 y.o.   MRN: [id number]  HPI  Dysuria x 3 weeks No fever, chills , back pain Denies any change in nocturia  No past medical history on file. Past Surgical History  Procedure Date  . Appendectomy   . Coronary artery bypass graft   . Hemorrhoid surgery   . Knee arthroscopy   . Elbow surgery   . Cataract extraction     x2    reports that he quit smoking about 22 years ago. He does not have any smokeless tobacco history on file. His alcohol and drug histories not on file. family history includes Stroke in his father. Allergies  Allergen Reactions  . Nsaids     REACTION: renal failure     Review of Systems no fever or chills   Objective:   Physical Exam  well-developed well-nourished male in no acute distress. HEENT exam atraumatic, normocephalic, neck supple without jugular venous distention. Chest clear to auscultation cardiac exam S1-S2 are regular. Abdominal exam overweight with bowel sounds, soft and nontender. Extremities no edema. Neurologic exam is alert with a normal gait.        Assessment & Plan:  ? uti---check culture

## 2010-06-15 ENCOUNTER — Ambulatory Visit (INDEPENDENT_AMBULATORY_CARE_PROVIDER_SITE_OTHER): Payer: Medicare Other | Admitting: Internal Medicine

## 2010-06-15 DIAGNOSIS — R109 Unspecified abdominal pain: Secondary | ICD-10-CM

## 2010-06-15 LAB — CBC WITH DIFFERENTIAL/PLATELET
Basophils Absolute: 0.1 10*3/uL (ref 0.0–0.1)
Basophils Relative: 1 % (ref 0.0–3.0)
Hemoglobin: 13.6 g/dL (ref 13.0–17.0)
Lymphocytes Relative: 35 % (ref 12.0–46.0)
Monocytes Relative: 9.2 % (ref 3.0–12.0)
Neutro Abs: 3.8 10*3/uL (ref 1.4–7.7)
RBC: 4.2 Mil/uL — ABNORMAL LOW (ref 4.22–5.81)
RDW: 16.3 % — ABNORMAL HIGH (ref 11.5–14.6)

## 2010-06-15 LAB — BASIC METABOLIC PANEL
Calcium: 8.9 mg/dL (ref 8.4–10.5)
GFR: 46.29 mL/min — ABNORMAL LOW (ref >60.00)
Sodium: 139 mEq/L (ref 135–145)

## 2010-06-15 LAB — HEPATIC FUNCTION PANEL
AST: 15 U/L (ref 0–37)
Albumin: 3.9 g/dL (ref 3.5–5.2)
Alkaline Phosphatase: 82 U/L (ref 39–117)

## 2010-06-15 NOTE — Progress Notes (Addendum)
  Subjective:    Patient ID: [name], male    DOB: Aug 19, 1924, 77 y.o.   MRN: [id number]  HPI  Comes in for acute visit Past one week he develops nausea at 9-10pm without emesis or diarhhea. He feels quite nervous during this time. Sxs can last all night. Not associated with any other problems. He states the anxiety makes him feel like he has to "pace the house".   He has noted a sore on his right buttock for the past several weeks---seems to be improving  No past medical history on file. Past Surgical History  Procedure Date  . Appendectomy   . Coronary artery bypass graft   . Hemorrhoid surgery   . Knee arthroscopy   . Elbow surgery   . Cataract extraction     x2    reports that he quit smoking about 22 years ago. He does not have any smokeless tobacco history on file. His alcohol and drug histories not on file. family history includes Stroke in his father. Allergies  Allergen Reactions  . Nsaids     REACTION: renal failure     Review of Systems  patient denies chest pain, shortness of breath, orthopnea. Denies lower extremity edema, abdominal pain, change in appetite, change in bowel movements. Patient denies rashes, musculoskeletal complaints. No other specific complaints in a complete review of systems.      Objective:   Physical Exam  Well-developed well-nourished male in no acute distress. HEENT exam atraumatic, normocephalic, extraocular muscles are intact. Neck is supple. No jugular venous distention no thyromegaly. Chest clear to auscultation without increased work of breathing. Cardiac exam S1 and S2 are regular. Abdominal exam active bowel sounds, soft, nontender. Extremities no edema. Neurologic exam she is alert without any motor sensory deficits. Gait is normal.        Assessment & Plan:   Nausea: this is the most concrete complaint. i'm not sure why it is associated with anxiety. Will check labs. I wonder about gallbladder dzs. I'm not sure if this is  related to organic or functional pathology  Note culture results from 4/23---will reculture

## 2010-06-15 NOTE — Progress Notes (Signed)
Addended by: Lindley Magnus on: 06/15/2010 09:01 AM   Modules accepted: Orders

## 2010-06-17 ENCOUNTER — Telehealth: Payer: Self-pay | Admitting: *Deleted

## 2010-06-17 DIAGNOSIS — R110 Nausea: Secondary | ICD-10-CM

## 2010-06-17 NOTE — Telephone Encounter (Signed)
Pt wanted to know what you wanted him to do about the nausea.  It makes him feel "wierd"

## 2010-06-18 MED ORDER — ONDANSETRON HCL 4 MG PO TABS: 4.0000 mg | ORAL_TABLET | Freq: Every day | ORAL | Status: DC | PRN

## 2010-06-18 NOTE — Telephone Encounter (Signed)
He could try Zofran 4 mg one daily as needed for nausea #20, one refill

## 2010-06-18 NOTE — Telephone Encounter (Signed)
rx called sent in electronically, pt aware

## 2010-06-22 ENCOUNTER — Other Ambulatory Visit: Payer: Self-pay | Admitting: *Deleted

## 2010-06-22 MED ORDER — LORAZEPAM 0.5 MG PO TABS: 0.5000 mg | ORAL_TABLET | Freq: Every evening | ORAL | Status: DC | PRN

## 2010-07-13 ENCOUNTER — Ambulatory Visit (INDEPENDENT_AMBULATORY_CARE_PROVIDER_SITE_OTHER): Payer: Medicare Other | Admitting: Internal Medicine

## 2010-07-13 ENCOUNTER — Encounter: Payer: Self-pay | Admitting: Internal Medicine

## 2010-07-13 VITALS — BP 100/60 | Temp 98.4°F | Wt 178.0 lb

## 2010-07-13 DIAGNOSIS — S61409A Unspecified open wound of unspecified hand, initial encounter: Secondary | ICD-10-CM

## 2010-07-13 DIAGNOSIS — W540XXA Bitten by dog, initial encounter: Secondary | ICD-10-CM

## 2010-07-13 MED ORDER — AMOXICILLIN-POT CLAVULANATE 875-125 MG PO TABS: 1.0000 | ORAL_TABLET | Freq: Two times a day (BID) | ORAL | Status: AC

## 2010-07-13 NOTE — Progress Notes (Signed)
  Subjective:    Patient ID: [name], male    DOB: 26-Nov-1924, 77 y.o.   MRN: [id number]  HPI  Patient comes in for evaluation. He was bitten on the hand approximately 3 days ago by a dog. Dog is up-to-date on all immunizations. This was checked with his International aid/development worker. The patient has noted increased swelling and some erythema of the hand since that time. Patient is not having fevers or chills.  No past medical history on file. Past Surgical History  Procedure Date  . Appendectomy   . Coronary artery bypass graft   . Hemorrhoid surgery   . Knee arthroscopy   . Elbow surgery   . Cataract extraction     x2    reports that he quit smoking about 22 years ago. He does not have any smokeless tobacco history on file. His alcohol and drug histories not on file. family history includes Stroke in his father. Allergies  Allergen Reactions  . Nsaids     REACTION: renal failure     Review of Systems    patient denies chest pain, shortness of breath, orthopnea. Denies lower extremity edema, abdominal pain, change in appetite, change in bowel movements. Patient denies rashes, musculoskeletal complaints. No other specific complaints in a complete review of systems.    Objective:   Physical Exam Well-developed male. Elderly. Patient has full range of motion of the wrist and fingers. He does have some erythema and soft tissue swelling of the right hand. No streaking erythema. No pain to palpation and no fluctuance.       Assessment & Plan:  Cellulitis due to dog bite. I think his best to treat with antibiotics. Side effects of Augmentin discussed we'll follow the patient tomorrow.

## 2010-07-14 ENCOUNTER — Ambulatory Visit (INDEPENDENT_AMBULATORY_CARE_PROVIDER_SITE_OTHER): Payer: Medicare Other | Admitting: Internal Medicine

## 2010-07-14 DIAGNOSIS — I10 Essential (primary) hypertension: Secondary | ICD-10-CM

## 2010-07-14 MED ORDER — LISINOPRIL 20 MG PO TABS: 10.0000 mg | ORAL_TABLET | Freq: Every day | ORAL | Status: DC

## 2010-07-14 MED ORDER — CARVEDILOL 25 MG PO TABS: 12.5000 mg | ORAL_TABLET | Freq: Two times a day (BID) | ORAL | Status: DC

## 2010-07-14 MED ORDER — ISOSORBIDE MONONITRATE ER 60 MG PO TB24: 60.0000 mg | ORAL_TABLET | Freq: Every day | ORAL | Status: DC

## 2010-07-14 NOTE — Progress Notes (Signed)
  Subjective:    Patient ID: [name], male    DOB: August 22, 1924, 77 y.o.   MRN: [id number]  HPI  Patient comes in for evaluation. He was bitten on the hand approximately 4 days ago by a dog. Dog is up-to-date on all immunizations. This was checked with his International aid/development worker. The patient has noted increased swelling and some erythema of the hand since that time. Patient is not having fevers or chills. See yesterday's note. Symptoms are much improved.  No past medical history on file. Past Surgical History  Procedure Date  . Appendectomy   . Coronary artery bypass graft   . Hemorrhoid surgery   . Knee arthroscopy   . Elbow surgery   . Cataract extraction     x2    reports that he quit smoking about 22 years ago. He does not have any smokeless tobacco history on file. His alcohol and drug histories not on file. family history includes Stroke in his father. Allergies  Allergen Reactions  . Nsaids     REACTION: renal failure     Review of Systems    patient denies chest pain, shortness of breath, orthopnea. Denies lower extremity edema, abdominal pain, change in appetite, change in bowel movements. Patient denies rashes, musculoskeletal complaints. No other specific complaints in a complete review of systems.    Objective:   Physical Exam Well-developed male. Elderly. Patient has full range of motion of the wrist and fingers. He does have some erythema and soft tissue swelling of the right hand. No streaking erythema. No pain to palpation and no fluctuance.       Assessment & Plan:  Cellulitis due to dog bite symptoms are improving. Continue antibiotics for 10 days.

## 2010-07-14 NOTE — Assessment & Plan Note (Addendum)
I suspect blood pressure is over treated. He needs to decrease medications. Decrease Coreg and decreased lisinopril. Side effects discussed. Medication was explained to the patient. BP Readings from Last 3 Encounters:  07/14/10 82/54  07/13/10 100/60  06/15/10 124/72

## 2010-08-09 ENCOUNTER — Other Ambulatory Visit: Payer: Self-pay | Admitting: Internal Medicine

## 2010-08-14 ENCOUNTER — Ambulatory Visit (INDEPENDENT_AMBULATORY_CARE_PROVIDER_SITE_OTHER): Payer: Medicare Other | Admitting: Internal Medicine

## 2010-08-14 DIAGNOSIS — I10 Essential (primary) hypertension: Secondary | ICD-10-CM

## 2010-08-14 DIAGNOSIS — G4700 Insomnia, unspecified: Secondary | ICD-10-CM

## 2010-08-14 DIAGNOSIS — N19 Unspecified kidney failure: Secondary | ICD-10-CM

## 2010-08-14 DIAGNOSIS — I2510 Atherosclerotic heart disease of native coronary artery without angina pectoris: Secondary | ICD-10-CM

## 2010-08-14 NOTE — Assessment & Plan Note (Signed)
Reasonable control Continue same meds 

## 2010-08-14 NOTE — Assessment & Plan Note (Signed)
No sxs Continue risk factor modification 

## 2010-08-14 NOTE — Assessment & Plan Note (Signed)
Doing well on current meds.

## 2010-08-14 NOTE — Progress Notes (Signed)
  Subjective:    Patient ID: [name], male    DOB: [birth year], 77 y.o.   MRN: [id number]  HPI  Gout - no recurrence  Insomnia--much better with neurontin  htn---tolerating meds without dificulty  CAD---no sxs  Renal insuff: - needs regular monitoring  No past medical history on file. Past Surgical History  Procedure Date  . Appendectomy   . Coronary artery bypass graft   . Hemorrhoid surgery   . Knee arthroscopy   . Elbow surgery   . Cataract extraction     x2    reports that he quit smoking about 22 years ago. He does not have any smokeless tobacco history on file. His alcohol and drug histories not on file. family history includes Stroke in his father. Allergies  Allergen Reactions  . Nsaids     REACTION: renal failure     Review of Systems  patient denies chest pain, shortness of breath, orthopnea. Denies lower extremity edema, abdominal pain, change in appetite, change in bowel movements. Patient denies rashes, musculoskeletal complaints. No other specific complaints in a complete review of systems.      Objective:   Physical Exam  well-developed well-nourished male in no acute distress. HEENT exam atraumatic, normocephalic, neck supple without jugular venous distention. Chest clear to auscultation cardiac exam S1-S2 are regular. Abdominal exam overweight with bowel sounds, soft and nontender. Extremities no edema. Neurologic exam is alert with a normal gait.    Assessment & Plan:  Recent dog bite---completely healed

## 2010-08-14 NOTE — Assessment & Plan Note (Signed)
Needs regular monitoring

## 2010-09-11 ENCOUNTER — Other Ambulatory Visit: Payer: Self-pay | Admitting: *Deleted

## 2010-09-11 MED ORDER — LORAZEPAM 0.5 MG PO TABS: 0.5000 mg | ORAL_TABLET | Freq: Every evening | ORAL | Status: DC | PRN

## 2010-09-16 ENCOUNTER — Emergency Department (HOSPITAL_COMMUNITY): Payer: Medicare Other

## 2010-09-16 ENCOUNTER — Emergency Department (HOSPITAL_COMMUNITY)
Admission: EM | Admit: 2010-09-16 | Discharge: 2010-09-16 | Disposition: A | Discharge status: Home / Self Care | Payer: Medicare Other | Attending: Emergency Medicine | Admitting: Emergency Medicine

## 2010-09-16 DIAGNOSIS — Z951 Presence of aortocoronary bypass graft: Secondary | ICD-10-CM | POA: Insufficient documentation

## 2010-09-16 DIAGNOSIS — I10 Essential (primary) hypertension: Secondary | ICD-10-CM | POA: Insufficient documentation

## 2010-09-16 DIAGNOSIS — R300 Dysuria: Secondary | ICD-10-CM | POA: Insufficient documentation

## 2010-09-16 DIAGNOSIS — N390 Urinary tract infection, site not specified: Secondary | ICD-10-CM | POA: Insufficient documentation

## 2010-09-16 DIAGNOSIS — Z8673 Personal history of transient ischemic attack (TIA), and cerebral infarction without residual deficits: Secondary | ICD-10-CM | POA: Insufficient documentation

## 2010-09-16 DIAGNOSIS — N200 Calculus of kidney: Secondary | ICD-10-CM | POA: Insufficient documentation

## 2010-09-16 LAB — URINALYSIS, ROUTINE W REFLEX MICROSCOPIC
Glucose, UA: NEGATIVE mg/dL
Protein, ur: NEGATIVE mg/dL
pH: 5 (ref 5.0–8.0)

## 2010-09-16 LAB — URINE MICROSCOPIC-ADD ON

## 2010-10-09 LAB — WOUND CULTURE: Gram Stain: NONE SEEN

## 2010-10-09 LAB — GRAM STAIN

## 2010-10-12 ENCOUNTER — Other Ambulatory Visit: Payer: Self-pay | Admitting: Internal Medicine

## 2010-11-18 ENCOUNTER — Other Ambulatory Visit: Payer: Self-pay | Admitting: Internal Medicine

## 2010-11-26 ENCOUNTER — Other Ambulatory Visit (INDEPENDENT_AMBULATORY_CARE_PROVIDER_SITE_OTHER): Payer: Medicare Other | Admitting: *Deleted

## 2010-11-26 DIAGNOSIS — I6529 Occlusion and stenosis of unspecified carotid artery: Secondary | ICD-10-CM

## 2010-11-26 DIAGNOSIS — Z48812 Encounter for surgical aftercare following surgery on the circulatory system: Secondary | ICD-10-CM

## 2010-11-30 ENCOUNTER — Other Ambulatory Visit: Payer: Self-pay | Admitting: Internal Medicine

## 2010-12-08 NOTE — Procedures (Unsigned)
CAROTID DUPLEX EXAM  INDICATION:  Follow up bilateral CEA.  HISTORY: Diabetes:  No. Cardiac:  Yes. Hypertension:  No. Smoking:  No. Previous Surgery:  Left CEA, 08/11/2004; right CEA, 10/08/2004. CV History: Amaurosis Fugax No, Paresthesias No, Hemiparesis No.                                      RIGHT             LEFT Brachial systolic pressure:         168               168 Brachial Doppler waveforms:         WNL               WNL Vertebral direction of flow:        Antegrade         Occluded DUPLEX VELOCITIES (cm/sec) CCA peak systolic                   51                64 ECA peak systolic                   64                105 ICA peak systolic                   48                35 ICA end diastolic                   15                13 PLAQUE MORPHOLOGY: PLAQUE AMOUNT:                      Minimal           Minimal PLAQUE LOCATION:                    CEA               CEA  IMPRESSION: 1. Patent bilateral carotid endarterectomies with minimal plaquing     observed. 2. Left vertebral artery is observed without flow. 3. Right vertebral artery is within normal limits.  ___________________________________________ Larina Earthly, M.D.  LT/MEDQ  D:  11/26/2010  T:  11/26/2010  Job:  [id number]

## 2010-12-14 ENCOUNTER — Encounter: Payer: Self-pay | Admitting: Internal Medicine

## 2010-12-14 ENCOUNTER — Ambulatory Visit (INDEPENDENT_AMBULATORY_CARE_PROVIDER_SITE_OTHER): Payer: Medicare Other | Admitting: Internal Medicine

## 2010-12-14 DIAGNOSIS — M109 Gout, unspecified: Secondary | ICD-10-CM

## 2010-12-14 DIAGNOSIS — I10 Essential (primary) hypertension: Secondary | ICD-10-CM

## 2010-12-14 DIAGNOSIS — Z23 Encounter for immunization: Secondary | ICD-10-CM

## 2010-12-14 DIAGNOSIS — E785 Hyperlipidemia, unspecified: Secondary | ICD-10-CM

## 2010-12-14 DIAGNOSIS — N19 Unspecified kidney failure: Secondary | ICD-10-CM

## 2010-12-14 LAB — LIPID PANEL
Total CHOL/HDL Ratio: 4
VLDL: 24.6 mg/dL (ref 0.0–40.0)

## 2010-12-14 LAB — BASIC METABOLIC PANEL
Chloride: 109 mEq/L (ref 96–112)
GFR: 45.89 mL/min — ABNORMAL LOW (ref >60.00)
Potassium: 4.9 mEq/L (ref 3.5–5.1)
Sodium: 143 mEq/L (ref 135–145)

## 2010-12-14 LAB — LDL CHOLESTEROL, DIRECT: Direct LDL: 132.2 mg/dL

## 2010-12-14 LAB — HEPATIC FUNCTION PANEL
ALT: 29 U/L (ref 0–53)
AST: 29 U/L (ref 0–37)
Bilirubin, Direct: 0.1 mg/dL (ref 0.0–0.3)
Total Bilirubin: 0.8 mg/dL (ref 0.3–1.2)

## 2010-12-14 NOTE — Assessment & Plan Note (Signed)
Previously controlled Check labs today 

## 2010-12-14 NOTE — Progress Notes (Signed)
  Subjective:    Patient ID: [name], male    DOB: 1924-09-27, 77 y.o.   MRN: [id number]  HPI HTN---tolerating meds, no home BPS  CAD---no sxs  Renal insufficiency---thought to be related to NSAIDs  Chronic back pain---doing well on gabapentin and NSAID  No past medical history on file. Past Surgical History  Procedure Date  . Appendectomy   . Coronary artery bypass graft   . Hemorrhoid surgery   . Knee arthroscopy   . Elbow surgery   . Cataract extraction     x2    reports that he quit smoking about 22 years ago. He does not have any smokeless tobacco history on file. His alcohol and drug histories not on file. family history includes Stroke in his father. Allergies  Allergen Reactions  . Nsaids     REACTION: renal failure      Review of Systems  patient denies chest pain, shortness of breath, orthopnea. Denies lower extremity edema, abdominal pain, change in appetite, change in bowel movements. Patient denies rashes, musculoskeletal complaints. No other specific complaints in a complete review of systems.      Objective:   Physical Exam  well-developed well-nourished male in no acute distress. HEENT exam atraumatic, normocephalic, neck supple without jugular venous distention. Chest clear to auscultation cardiac exam S1-S2 are regular. Abdominal exam overweight with bowel sounds, soft and nontender. Extremities no edema. Neurologic exam is alert with a normal gait.        Assessment & Plan:

## 2010-12-14 NOTE — Assessment & Plan Note (Signed)
Will check labs today

## 2010-12-14 NOTE — Assessment & Plan Note (Signed)
No recurrence. 

## 2010-12-14 NOTE — Assessment & Plan Note (Signed)
He has not taken bp meds today I've asked him to be compliant with meds and monitor BP at home Goal BP < 135/85

## 2011-01-20 ENCOUNTER — Other Ambulatory Visit: Payer: Self-pay | Admitting: *Deleted

## 2011-01-20 MED ORDER — EZETIMIBE-SIMVASTATIN 10-40 MG PO TABS: 1.0000 | ORAL_TABLET | Freq: Every day | ORAL | Status: DC

## 2011-01-31 ENCOUNTER — Other Ambulatory Visit: Payer: Self-pay | Admitting: Internal Medicine

## 2011-02-20 ENCOUNTER — Other Ambulatory Visit: Payer: Self-pay | Admitting: Internal Medicine

## 2011-02-25 ENCOUNTER — Other Ambulatory Visit: Payer: Self-pay | Admitting: Internal Medicine

## 2011-03-01 ENCOUNTER — Other Ambulatory Visit: Payer: Self-pay | Admitting: Internal Medicine

## 2011-03-14 ENCOUNTER — Other Ambulatory Visit: Payer: Self-pay | Admitting: Internal Medicine

## 2011-03-21 ENCOUNTER — Other Ambulatory Visit: Payer: Self-pay | Admitting: Internal Medicine

## 2011-04-06 ENCOUNTER — Other Ambulatory Visit: Payer: Self-pay | Admitting: Internal Medicine

## 2011-04-30 ENCOUNTER — Ambulatory Visit (INDEPENDENT_AMBULATORY_CARE_PROVIDER_SITE_OTHER): Payer: Medicare Other | Admitting: Family Medicine

## 2011-04-30 ENCOUNTER — Ambulatory Visit: Payer: Medicare Other | Admitting: Family Medicine

## 2011-04-30 VITALS — BP 120/88 | Temp 97.8°F | Wt 171.0 lb

## 2011-04-30 DIAGNOSIS — R300 Dysuria: Secondary | ICD-10-CM

## 2011-04-30 DIAGNOSIS — R42 Dizziness and giddiness: Secondary | ICD-10-CM

## 2011-04-30 LAB — POCT URINALYSIS DIPSTICK
Bilirubin, UA: NEGATIVE
Glucose, UA: NEGATIVE
Spec Grav, UA: >=1.03
Urobilinogen, UA: 0.2

## 2011-04-30 MED ORDER — CIPROFLOXACIN HCL 500 MG PO TABS: 500.0000 mg | ORAL_TABLET | Freq: Two times a day (BID) | ORAL | Status: DC

## 2011-04-30 NOTE — Progress Notes (Signed)
  Subjective:    Patient ID: [name], male    DOB: 08/31/1924, 78 y.o.   MRN: [id number]  HPI  Acute visit. Patient seen with dizziness. Onset Tuesday. By description more vertigo. This morning lost his balance but fell against the bed. No loss of consciousness. Symptoms worse with movement. He denies ataxia, slurred speech, focal weakness, or headache. No sudden hearing changes. Also having burning with urination for the past week. No fever or chills. No nausea or vomiting. Urine has been very cloudy and dark in color. No abdominal pain.  No gross hematuria.    No past medical history on file. Past Surgical History  Procedure Date  . Appendectomy   . Coronary artery bypass graft   . Hemorrhoid surgery   . Knee arthroscopy   . Elbow surgery   . Cataract extraction     x2    reports that he quit smoking about 23 years ago. He does not have any smokeless tobacco history on file. His alcohol and drug histories not on file. family history includes Stroke in his father. Allergies  Allergen Reactions  . Nsaids     REACTION: renal failure      Review of Systems  Constitutional: Negative for fever, chills and appetite change.  HENT: Negative for congestion.   Respiratory: Negative for cough and shortness of breath.   Cardiovascular: Negative for chest pain.  Gastrointestinal: Negative for abdominal pain.  Genitourinary: Positive for dysuria. Negative for hematuria and difficulty urinating.  Neurological: Positive for dizziness. Negative for syncope and headaches.       Objective:   Physical Exam  Constitutional: He is oriented to person, place, and time. He appears well-developed and well-nourished.  HENT:  Mouth/Throat: Oropharynx is clear and moist.  Neck: Neck supple.  Cardiovascular: Normal rate and regular rhythm.   Pulmonary/Chest: Effort normal and breath sounds normal. No respiratory distress. He has no wheezes. He has no rales.  Abdominal: Soft. There is no  tenderness.  Musculoskeletal: He exhibits no edema.  Neurological: He is alert and oriented to person, place, and time. No cranial nerve deficit.       Cerebellar function normal. Patient and laying without difficulty. No focal weakness          Assessment & Plan:  #1 dysuria. Probable UTI. Urine culture sent. Cipro 500 mg twice a day for 7 days pending culture results. Increase fluid intake #2 vertigo. Nonfocal neurologic exam. Suspect benign peripheral origin. Patient is encouraged to use cane for assistance at all times and change positions slowly.

## 2011-04-30 NOTE — Patient Instructions (Signed)

## 2011-05-01 ENCOUNTER — Encounter: Payer: Self-pay | Admitting: Family Medicine

## 2011-05-03 LAB — URINE CULTURE: Colony Count: 10000

## 2011-05-04 NOTE — Progress Notes (Signed)
Quick Note:  Pt informed on personally identified home VM ______ 

## 2011-05-09 ENCOUNTER — Emergency Department (HOSPITAL_COMMUNITY)
Admission: EM | Admit: 2011-05-09 | Discharge: 2011-05-09 | Disposition: A | Discharge status: Home / Self Care | Payer: Medicare Other | Attending: Emergency Medicine | Admitting: Emergency Medicine

## 2011-05-09 ENCOUNTER — Emergency Department (HOSPITAL_COMMUNITY): Payer: Medicare Other

## 2011-05-09 ENCOUNTER — Encounter (HOSPITAL_COMMUNITY): Payer: Self-pay | Admitting: *Deleted

## 2011-05-09 DIAGNOSIS — N289 Disorder of kidney and ureter, unspecified: Secondary | ICD-10-CM | POA: Insufficient documentation

## 2011-05-09 DIAGNOSIS — R531 Weakness: Secondary | ICD-10-CM

## 2011-05-09 DIAGNOSIS — Z79899 Other long term (current) drug therapy: Secondary | ICD-10-CM | POA: Insufficient documentation

## 2011-05-09 DIAGNOSIS — K8020 Calculus of gallbladder without cholecystitis without obstruction: Secondary | ICD-10-CM | POA: Insufficient documentation

## 2011-05-09 DIAGNOSIS — Z951 Presence of aortocoronary bypass graft: Secondary | ICD-10-CM | POA: Insufficient documentation

## 2011-05-09 DIAGNOSIS — R10814 Left lower quadrant abdominal tenderness: Secondary | ICD-10-CM | POA: Insufficient documentation

## 2011-05-09 DIAGNOSIS — R5381 Other malaise: Secondary | ICD-10-CM | POA: Insufficient documentation

## 2011-05-09 DIAGNOSIS — R197 Diarrhea, unspecified: Secondary | ICD-10-CM | POA: Insufficient documentation

## 2011-05-09 HISTORY — DX: Atherosclerotic heart disease of native coronary artery without angina pectoris: I25.10

## 2011-05-09 HISTORY — DX: Cerebral infarction, unspecified: I63.9

## 2011-05-09 LAB — POCT I-STAT, CHEM 8
BUN: 94 mg/dL — ABNORMAL HIGH (ref 6–23)
Creatinine, Ser: 5.2 mg/dL — ABNORMAL HIGH (ref 0.50–1.35)
Potassium: 4.9 mEq/L (ref 3.5–5.1)
Sodium: 138 mEq/L (ref 135–145)
TCO2: 20 mmol/L (ref 0–100)

## 2011-05-09 LAB — URINALYSIS, ROUTINE W REFLEX MICROSCOPIC
Bilirubin Urine: NEGATIVE
Hgb urine dipstick: NEGATIVE
Specific Gravity, Urine: 1.024 (ref 1.005–1.030)
Urobilinogen, UA: 0.2 mg/dL (ref 0.0–1.0)

## 2011-05-09 LAB — DIFFERENTIAL
Basophils Relative: 0 % (ref 0–1)
Monocytes Absolute: 1.4 10*3/uL — ABNORMAL HIGH (ref 0.1–1.0)
Monocytes Relative: 8 % (ref 3–12)
Neutro Abs: 13.1 10*3/uL — ABNORMAL HIGH (ref 1.7–7.7)

## 2011-05-09 LAB — CBC
HCT: 39.1 % (ref 39.0–52.0)
Hemoglobin: 13.2 g/dL (ref 13.0–17.0)
MCH: 33.8 pg (ref 26.0–34.0)
MCHC: 33.8 g/dL (ref 30.0–36.0)
MCV: 100 fL (ref 78.0–100.0)

## 2011-05-09 MED ORDER — SODIUM CHLORIDE 0.9 % IV BOLUS (SEPSIS)
500.0000 mL | Freq: Once | INTRAVENOUS | Status: AC
  Administered 2011-05-09: 500 mL via INTRAVENOUS

## 2011-05-09 MED ORDER — SODIUM CHLORIDE 0.9 % IV SOLN
INTRAVENOUS | Status: DC
  Administered 2011-05-09: 13:00:00 via INTRAVENOUS

## 2011-05-09 NOTE — Discharge Instructions (Signed)
Follow with Dr. Cato Mulligan 1-2 days. Hold your Lasix for 2 days.  Weakness, Generalized Without Cause Your caregiver has seen you today because you are having problems with feelings of weakness. Weakness has many different causes, some of which are common and others are very rare. The causes of weakness are so numerous they could not all be listed on this page. The exam and other tests done today do not reveal a specific cause for the weakness that is an immediate danger or something that is treatable. Your caregiver has checked you for the most common causes of weakness and feels it is safe for you to go home and be observed. HOME CARE INSTRUCTIONS   For the time being, obtain more rest if needed.   Eat a well balanced diet.   Try to get at least some exercise every day in spite of how difficult it may seem at times. In the case of the elderly, exercise is especially important. As we grow older, there is a loss of muscle mass. Generally, there is also a loss of, or decrease in, activity that comes naturally with the aging process. Exercise and increased activities are the only tools we have to combat this natural process.   The results of some tests ordered today may not be available right away. You will be contacted with those results when they become available.   It is important to follow through with your physician as per instructions that you may have received today.  SEEK MEDICAL CARE IF:   You have any new concerns which you do not feel were dealt with today.   The weakness seems to be getting progressively worse.   You develop new or unusual aches or pains.  SEEK IMMEDIATE MEDICAL CARE IF:   You are unable to tend to your usual daily activities such as simply getting dressed, feeding yourself, or keeping up with your personal hygiene.   You develop inability to walk stairs or perform your usual daily activities.   You develop shortness of breath, chest pain, have difficulty moving  parts of your body, or develop new problems for which you have not talked to your caregiver.   You experience difficulty speaking or swallowing.   You develop loss of control of bladder or bowels that was not present before.  Document Released: 12/28/2004 Document Revised: 12/17/2010 Document Reviewed: 06/09/2006 [facility] Patient Information 2012 [place], [place].Kidney Failure In kidney failure, the kidneys lose their ability to filter enough waste products from the blood. They also lose the ability to regulate the body's balance of salt and water. Eventually, the kidneys slow their production of urine or stop producing it completely. Waste products and water gather in the body. This can lead to a life-threatening overload of fluids (such as heart failure). It can also lead to a dangerous buildup of waste products in the blood. These extreme changes in blood chemistry can affect the function of the heart and brain.  TYPES OF KIDNEY FAILURE Acute kidney failure. In this form of kidney failure, the kidneys stop working properly because of a sudden illness, a medicine, or a medical condition that causes one of the following:   A severe drop in blood pressure or an interruption in the normal blood flow to the kidneys. This can occur during:   Major surgery.   Severe burns with fluid loss.   Massive bleeding.   A heart attack that severely affects heart function.   Blood clots that travel to the kidney.  Direct damage to kidney cells or to the kidneys' filtering units. This can be caused by:   An inflammation of the kidneys.   Toxic chemicals.   Medicines or infections.   Blocked urine flow from the kidney. This can occur because of obstructions outside the kidney, such as:   Kidney stones.   Bladder tumors.   An enlarged prostate.  Blockage of urine flow within the kidney can also cause sudden kidney failure, as can occur with large muscle injuries.  Chronic kidney failure. In  this form of kidney failure, the kidney gradually loses function. This happens over a period of years. It is a slow and gradual loss of the ability of the kidneys to send out wastes, concentrate urine, and conserve the salts in your blood. Some of the causes of chronic kidney failure are:  Diabetes (very common cause).   Polycystic kidney disease.   Glomerulonephritis.   Alport syndrome.   The flow of urine out of the kidney is blocked (obstructive uropathy).   High blood pressure (very common).   Long-term exposure to lead, mercury, and other chemicals and medicines.   Kidney stones with infection.   Reflux nephropathy.   Pain medicine overuse.  Some forms of chronic kidney failure run in families. Your caregiver will ask you about family medical problems.  End-stage kidney disease (ESKD). This is also called end-stage kidney failure. In ESKD, kidney function worsens until the person dies. This is usually the result of longstanding chronic kidney failure, but sometimes it follows acute kidney failure. SYMPTOMS  Symptoms vary depending on the type of kidney failure.   Acute kidney failure. Symptoms include:   Swelling (edema) resulting from salt and water overload.   High blood pressure.   Vomiting.   Tiredness (lethargy) caused by the toxic effects of waste products on brain function.   Feeling sick to your stomach (nauseous).   Decreased urine output.   Chronic kidney failure and ERSD. Because the kidney damage in chronic kidney failure occurs slowly over a long time, symptoms develop slowly. Symptoms can include:   Headache.   Weakness.   Itching.   Vomiting.   Pale skin.   Slowing of growth in children.   Fatigue.   Tiredness (lethargy).   Poor appetite.   Increased thirst.   High blood pressure.   Bone damage in adults.  DIAGNOSIS  If you have an illness or medical condition that increases the risk of acute kidney failure, your caregivers will  watch you closely. You may have blood and urine tests that measure the function of your kidneys. If you have a medical condition that increases the risk of long-term kidney damage, your caregiver will check your blood pressure and look for symptoms of chronic kidney failure during rechecks. TREATMENT  Treatment depends on the type of kidney failure.   Acute kidney failure. Treatment begins with measures to correct the cause of kidney failure (shock, hemorrhage, burns, heart attack). After this has begun, more specific kidney treatment may include:   Fluids given through the vein (intravenously) to correct any abnormal fluid loss.   Medicines called diuretics that increase urine output.   Limited fluids by mouth.   A diet low in protein and high in carbohydrates.   Medicines to adjust high or low levels of blood chemicals, such as potassium and medicines to control high blood pressure.   Short-term dialysis may be necessary if the patient develops severe high blood pressure, severe fluid overload, heart failure, symptoms  of altered brain function, or severe abnormalities in blood chemistry.   Chronic kidney failure. People with chronic kidney failure are watched closely. They receive frequent physical exams, blood pressure checks, and blood testing. Treatment includes:   A low-protein and low-salt diet.   Medicines to adjust blood chemical levels.   Medicines to treat high blood pressure.   Sometimes, a hormonal medicine called erythropoietin is given to correct a low level of red blood cells (anemia).   ESKD. Treatment includes:   Dialysis until a donor can be found for a kidney transplant. Dialysis mechanically removes waste products from the blood.   Both kidneys may need to be removed surgically before a transplant in patients with severe high blood pressure or chronic pyelonephritis.  PROGNOSIS   Acute kidney failure may go away on its own. Some people recover within a matter  of days. Exactly how long the illness lasts varies greatly from person-to-person. The duration depends on the cause of the kidney problem. In rare cases, acute kidney failure progresses to ESKD. Among people who recover, about 50% have some permanent kidney damage. In most cases, this is not severe enough to prevent you from living a normal life.   Chronic kidney failure is a lifelong problem that can worsen over time to become ESKD. Not everyone develops ESKD. For those who do, the time it takes for ESKD to develop varies from person-to-person.   ESKD is a permanent condition that can be treated only with dialysis or a kidney transplant.  PREVENTION  Many forms of kidney failure cannot be prevented. People who have diabetes, high blood pressure, or coronary artery disease should try to control the illness with:  Appropriate diet.   Medicine.   Lifestyle changes.  If you have chronic kidney failure, you should tell all caregivers who treat you.  HOME CARE INSTRUCTIONS   Follow your diet and take your medicines as instructed.   Do not use any new medicines (prescription, over-the-counter, or nutritional supplements) unless approved by your caregiver. Many medicines can worsen your kidney damage or need to have the dose adjusted.   If dialysis is scheduled, keep all appointments. Call if you are unable to keep an appointment.  SEEK MEDICAL CARE IF:   You develop unexplained weakness, tiredness, or appetite loss.   You feel poorly with no clear explanation.  SEEK IMMEDIATE MEDICAL CARE IF:   The amount of urine you produce either distinctly increases or decreases.   You develop swelling of the face and/or ankles.   You develop shortness of breath.  FOR MORE INFORMATION  National Institute of Diabetes and Digestive and Kidney Diseases: CheatPrevention.[employer].au National Kidney Foundation: www.kidney.org Document Released: 12/28/2004 Document Revised: 12/17/2010 Document Reviewed:  04/30/2009 [facility] Patient Information 2012 [place], [place].

## 2011-05-09 NOTE — ED Notes (Signed)
Pt from home with reports of worsening generalized weakness and loss of bowel and bladder control that started about 4 days.

## 2011-05-09 NOTE — ED Provider Notes (Signed)
History     CSN: [id number]  Arrival date & time 05/09/11  1008   First MD Initiated Contact with Patient 05/09/11 1056      Chief Complaint  Patient presents with  . Weakness    (Consider location/radiation/quality/duration/timing/severity/associated sxs/prior treatment) Patient is a 78 y.o. male presenting with weakness. The history is provided by the patient.  Weakness Primary symptoms do not include headaches, fever, nausea or vomiting.  Additional symptoms include weakness. Additional symptoms do not include neck stiffness.   patient states she's had worsening generalized weakness for the last 4 days. He states he just feels weak all over and is having difficulty doing some things. He does not say his legs are weaker than his arms. He states she's also had urinary incontinence which he states is dribbling. He states he's also had some mucousy diarrhea. He states he is also having fecal incontinence. His chronic back pain, but he states is no different recently. He was recently treated for urinary tract infection. He states that those symptoms have resolved. He states the weakness began at the end of the symptoms. No headache.  Past Medical History  Diagnosis Date  . Coronary artery disease   . Stroke     Past Surgical History  Procedure Date  . Appendectomy   . Coronary artery bypass graft   . Hemorrhoid surgery   . Knee arthroscopy   . Elbow surgery   . Cataract extraction     x2    Family History  Problem Relation Age of Onset  . Stroke Father     History  Substance Use Topics  . Smoking status: Former Smoker    Quit date: 01/12/1988  . Smokeless tobacco: Never Used  . Alcohol Use: No      Review of Systems  Constitutional: Negative for fever, activity change and appetite change.  HENT: Negative for neck stiffness.   Eyes: Negative for pain.  Respiratory: Negative for chest tightness and shortness of breath.   Cardiovascular: Negative for chest pain  and leg swelling.  Gastrointestinal: Positive for diarrhea. Negative for nausea, vomiting and abdominal pain.  Genitourinary: Negative for flank pain and penile pain.  Musculoskeletal: Positive for back pain.  Skin: Negative for rash.  Neurological: Positive for weakness. Negative for numbness and headaches.  Psychiatric/Behavioral: Negative for behavioral problems.    Allergies  Nsaids  Home Medications   Current Outpatient Rx  Name Route Sig Dispense Refill  . ALLOPURINOL 300 MG PO TABS Oral Take 300 mg by mouth daily.    Marland Kitchen BIMATOPROST 0.03 % OP SOLN Both Eyes Place 1 drop into both eyes at bedtime.    Marland Kitchen BRIMONIDINE TARTRATE 0.15 % OP SOLN Both Eyes Place 1 drop into both eyes 2 (two) times daily.      Marland Kitchen CARVEDILOL 25 MG PO TABS Oral Take 25 mg by mouth 2 (two) times daily with a meal.    . CIPROFLOXACIN HCL 500 MG PO TABS Oral Take 500 mg by mouth 2 (two) times daily. He was to take for 10 days. He started on 04/30/11. He stated that he completed the regimen.    Marland Kitchen EZETIMIBE-SIMVASTATIN 10-40 MG PO TABS Oral Take 1 tablet by mouth at bedtime.    Marland Kitchen FLUTICASONE PROPIONATE 50 MCG/ACT NA SUSP Nasal Place 2 sprays into the nose daily.    . FUROSEMIDE 40 MG PO TABS Oral Take 40 mg by mouth daily.    Marland Kitchen GABAPENTIN 300 MG PO CAPS Oral Take 300-600  mg by mouth See admin instructions. He takes one capsule in the morning and two capsules at bedtime.    . ISOSORBIDE MONONITRATE ER 60 MG PO TB24 Oral Take 60 mg by mouth daily.    Marland Kitchen LEVOBUNOLOL HCL 0.25 % OP SOLN Both Eyes Place 1 drop into both eyes at bedtime.      Marland Kitchen LISINOPRIL 20 MG PO TABS Oral Take 20 mg by mouth daily.    Marland Kitchen LORAZEPAM 0.5 MG PO TABS Oral Take 0.5 mg by mouth at bedtime.    Marland Kitchen NITROGLYCERIN 0.4 MG SL SUBL Sublingual Place 0.4 mg under the tongue every 5 (five) minutes as needed. For chest pain.    Marland Kitchen PREDNISONE 5 MG PO TABS Oral Take 2.5 mg by mouth every other day.       BP 112/47  Pulse 61  Temp(Src) 98.1 F (36.7 C) (Oral)   Resp 18  SpO2 97%  Physical Exam  Nursing note and vitals reviewed. Constitutional: He is oriented to person, place, and time. He appears well-developed and well-nourished.  HENT:  Head: Normocephalic and atraumatic.  Eyes: EOM are normal. Pupils are equal, round, and reactive to light.  Neck: Normal range of motion. Neck supple.  Cardiovascular: Normal rate, regular rhythm and normal heart sounds.   No murmur heard. Pulmonary/Chest: Effort normal and breath sounds normal.  Abdominal: Soft. Bowel sounds are normal. He exhibits no distension and no mass. There is tenderness. There is no rebound and no guarding.       Left lower quadrant tenderness without rebound or guarding.  Musculoskeletal: Normal range of motion. He exhibits no edema.       No lumbar tenderness.  Neurological: He is alert and oriented to person, place, and time. No cranial nerve deficit.       Sensation and strength is intact in bilateral feet. Strong straight leg raise on both sides. Perineal sensation is intact.  Skin: Skin is warm and dry.  Psychiatric: He has a normal mood and affect.    ED Course  Procedures (including critical care time)  Labs Reviewed  CBC - Abnormal; Notable for the following:    WBC 17.0 (*)    RBC 3.91 (*)    All other components within normal limits  DIFFERENTIAL - Abnormal; Notable for the following:    Neutro Abs 13.1 (*)    Lymphocytes Relative 11 (*)    Monocytes Absolute 1.4 (*)    All other components within normal limits  POCT I-STAT, CHEM 8 - Abnormal; Notable for the following:    BUN 94 (*)    Creatinine, Ser 5.20 (*)    Glucose, Bld 107 (*)    All other components within normal limits  URINALYSIS, ROUTINE W REFLEX MICROSCOPIC  TROPONIN I   Ct Abdomen Pelvis Wo Contrast  05/09/2011  *RADIOLOGY REPORT*  Clinical Data: Generalized weakness, incontinence and leukocytosis. Significant renal insufficiency.  CT ABDOMEN AND PELVIS WITHOUT CONTRAST  Technique:   Multidetector CT imaging of the abdomen and pelvis was performed following the standard protocol without intravenous contrast.  Comparison: Prior CT dated 11/12/2009  Findings: The visualized lung bases show emphysematous changes. Unenhanced appearance of the liver, spleen, adrenal glands and kidneys are unremarkable.  There is no evidence of hydronephrosis. Stable central cyst in the right kidney.  There are some layering gallstones in the dependent gallbladder. No evidence of biliary obstruction or gallbladder inflammation.  A few small peripancreatic lymph nodes are identified.  There is no evidence  of obvious pancreatic mass by unenhanced CT.  Increased prominence of calcified atherosclerotic plaque in the abdominal aorta since prior CT.  No evidence of aortic aneurysm. No free fluid or abscess. The bladder is unremarkable.  No hernias. Diverticulosis present of the sigmoid colon without evidence of diverticulitis.  IMPRESSION:  1.  Cholelithiasis without obvious evidence of cholecystitis or biliary obstruction by CT. 2.  No acute findings.  Original Report Authenticated By: Reola Calkins, M.D.   Dg Chest 2 View  05/09/2011  *RADIOLOGY REPORT*  Clinical Data: Generalized weakness and leukocytosis.  CHEST - 2 VIEW  Comparison: 10/05/2004  Findings: Mild scattered areas of atelectasis and parenchymal scarring noted bilaterally.  No focal infiltrate, edema or pleural fluid identified.  Heart size is stable status post prior CABG. There is stable ectasia of the thoracic aorta.  The bony thorax shows spondylosis of the thoracic spine.  IMPRESSION: No active disease.  Original Report Authenticated By: Reola Calkins, M.D.     1. Renal insufficiency   2. Weakness generalized     Date: 05/09/2011  Rate: 70  Rhythm: normal sinus rhythm  QRS Axis: normal  Intervals: normal  ST/T Wave abnormalities: normal  Conduction Disutrbances:right bundle branch block  Narrative Interpretation: rbbb is new   Old EKG Reviewed: changes noted    MDM  Patiently generalized weakness. Nonlateralizing. He also states he's had a little while so urine or bladder control. He's recently been on Avelox for urinary tract infection. His urine no longer appears infected. Diarrhea could been related to antibiotics. Lab work is reassuring except for a creatinine of 5. Patient would rather go home and be evaluated by his primary care Dr. He feels better. CT scan did not show any spinal lesion. He'll be discharged home        Juliet Rude. Rubin Payor, MD 05/09/11 1534

## 2011-05-09 NOTE — ED Notes (Signed)
Patient returned from CT

## 2011-05-09 NOTE — ED Notes (Signed)
Patient transported to X-ray 

## 2011-05-13 ENCOUNTER — Ambulatory Visit (INDEPENDENT_AMBULATORY_CARE_PROVIDER_SITE_OTHER): Payer: Medicare Other | Admitting: Internal Medicine

## 2011-05-13 VITALS — BP 140/78 | HR 72 | Temp 97.7°F | Resp 16 | Ht 72.0 in | Wt 167.0 lb

## 2011-05-13 DIAGNOSIS — N19 Unspecified kidney failure: Secondary | ICD-10-CM

## 2011-05-13 DIAGNOSIS — N179 Acute kidney failure, unspecified: Secondary | ICD-10-CM

## 2011-05-13 LAB — POCT URINALYSIS DIPSTICK
Bilirubin, UA: NEGATIVE
Ketones, UA: NEGATIVE
Nitrite, UA: NEGATIVE
Spec Grav, UA: 1.015
Urobilinogen, UA: 0.2
pH, UA: 5

## 2011-05-13 LAB — BASIC METABOLIC PANEL
BUN: 74 mg/dL — ABNORMAL HIGH (ref 6–23)
CO2: 21 mEq/L (ref 19–32)
Chloride: 107 mEq/L (ref 96–112)
Glucose, Bld: 108 mg/dL — ABNORMAL HIGH (ref 70–99)
Potassium: 4.6 mEq/L (ref 3.5–5.1)

## 2011-05-13 NOTE — Progress Notes (Signed)
Patient ID: [name], male   DOB: 29-Oct-1924, 78 y.o.   MRN: [id number] Comes in for f/u from ED-- reviewed ED note, labs and imaging studies.  note Crt>5, markedly increased. Pt admits to being dehydrated at that time. Diarrhea has resolved. He still feels generally weak but better,.  Past Medical History  Diagnosis Date  . Coronary artery disease   . Stroke     History   Social History  . Marital Status: Widowed    Spouse Name: N/A    Number of Children: N/A  . Years of Education: N/A   Occupational History  . Not on file.   Social History Main Topics  . Smoking status: Former Smoker    Quit date: 01/12/1988  . Smokeless tobacco: Never Used  . Alcohol Use: No  . Drug Use: No  . Sexually Active: Not on file   Other Topics Concern  . Not on file   Social History Narrative  . No narrative on file    Past Surgical History  Procedure Date  . Appendectomy   . Coronary artery bypass graft   . Hemorrhoid surgery   . Knee arthroscopy   . Elbow surgery   . Cataract extraction     x2    Family History  Problem Relation Age of Onset  . Stroke Father     Allergies  Allergen Reactions  . Nsaids     REACTION: renal failure    Current Outpatient Prescriptions on File Prior to Visit  Medication Sig Dispense Refill  . allopurinol (ZYLOPRIM) 300 MG tablet Take 300 mg by mouth daily.      . bimatoprost (LUMIGAN) 0.03 % ophthalmic solution Place 1 drop into both eyes at bedtime.      . brimonidine (ALPHAGAN) 0.15 % ophthalmic solution Place 1 drop into both eyes 2 (two) times daily.        . carvedilol (COREG) 25 MG tablet Take 25 mg by mouth 2 (two) times daily with a meal.      . ezetimibe-simvastatin (VYTORIN) 10-40 MG per tablet Take 1 tablet by mouth at bedtime.      . fluticasone (FLONASE) 50 MCG/ACT nasal spray Place 2 sprays into the nose daily.      . furosemide (LASIX) 40 MG tablet Take 40 mg by mouth daily.      Marland Kitchen gabapentin (NEURONTIN) 300 MG capsule Take  300-600 mg by mouth See admin instructions. He takes one capsule in the morning and two capsules at bedtime.      . isosorbide mononitrate (IMDUR) 60 MG 24 hr tablet Take 60 mg by mouth daily.      Marland Kitchen levobunolol (BETAGAN) 0.25 % ophthalmic solution Place 1 drop into both eyes at bedtime.        Marland Kitchen lisinopril (PRINIVIL,ZESTRIL) 20 MG tablet Take 20 mg by mouth daily.      Marland Kitchen LORazepam (ATIVAN) 0.5 MG tablet Take 0.5 mg by mouth at bedtime.      . nitroGLYCERIN (NITROSTAT) 0.4 MG SL tablet Place 0.4 mg under the tongue every 5 (five) minutes as needed. For chest pain.      . predniSONE (DELTASONE) 5 MG tablet Take 2.5 mg by mouth every other day.          patient denies chest pain, shortness of breath, orthopnea. Denies lower extremity edema, abdominal pain, change in appetite, change in bowel movements. Patient denies rashes, musculoskeletal complaints. No other specific complaints in a complete review of systems.  BP 140/78  Pulse 72  Temp 97.7 F (36.5 C)  Resp 16  Ht 6' (1.829 m)  Wt 167 lb (75.751 kg)  BMI 22.65 kg/m2 elderly male in no acute distress. HEENT exam atraumatic, normocephalic, neck supple without jugular venous distention. Chest clear to auscultation cardiac exam S1-S2 are regular. Abdominal exam overweight with bowel sounds, soft and nontender. Extremities no edema. Neurologic exam is alert with a normal gait.

## 2011-05-13 NOTE — Assessment & Plan Note (Signed)
i'm most concerned about what appears to be acute renal failure. (?renal disease vs dehydration) Needs labs, I'll make decision about ACE-I and other meds after I receive lab results. I'll ask him to "hold" lisinopril until we contact him.  CT scan was negative for hydronephrosis

## 2011-05-25 ENCOUNTER — Ambulatory Visit (INDEPENDENT_AMBULATORY_CARE_PROVIDER_SITE_OTHER): Payer: Medicare Other | Admitting: Internal Medicine

## 2011-05-25 VITALS — BP 118/72 | Temp 97.6°F | Wt 171.0 lb

## 2011-05-25 DIAGNOSIS — N19 Unspecified kidney failure: Secondary | ICD-10-CM

## 2011-05-25 DIAGNOSIS — M549 Dorsalgia, unspecified: Secondary | ICD-10-CM

## 2011-05-25 LAB — BASIC METABOLIC PANEL
Calcium: 9 mg/dL (ref 8.4–10.5)
GFR: 30.37 mL/min — ABNORMAL LOW (ref >60.00)
Glucose, Bld: 83 mg/dL (ref 70–99)
Potassium: 4.7 mEq/L (ref 3.5–5.1)
Sodium: 138 mEq/L (ref 135–145)

## 2011-05-25 LAB — POCT URINALYSIS DIPSTICK
Blood, UA: NEGATIVE
Glucose, UA: NEGATIVE
Leukocytes, UA: NEGATIVE
Nitrite, UA: NEGATIVE
Urobilinogen, UA: 0.2

## 2011-05-25 NOTE — Assessment & Plan Note (Signed)
Reviewed previous note and labs Check bmet

## 2011-05-27 NOTE — Progress Notes (Signed)
Patient ID: [name], male   DOB: Feb 01, 1924, 78 y.o.   MRN: [id number] Here for f/u of ARF. Thought to be secondary to dehydration on baseline CRF  Past Medical History  Diagnosis Date  . Coronary artery disease   . Stroke     History   Social History  . Marital Status: Widowed    Spouse Name: N/A    Number of Children: N/A  . Years of Education: N/A   Occupational History  . Not on file.   Social History Main Topics  . Smoking status: Former Smoker    Quit date: 01/12/1988  . Smokeless tobacco: Never Used  . Alcohol Use: No  . Drug Use: No  . Sexually Active: Not on file   Other Topics Concern  . Not on file   Social History Narrative  . No narrative on file    Past Surgical History  Procedure Date  . Appendectomy   . Coronary artery bypass graft   . Hemorrhoid surgery   . Knee arthroscopy   . Elbow surgery   . Cataract extraction     x2    Family History  Problem Relation Age of Onset  . Stroke Father     Allergies  Allergen Reactions  . Nsaids     REACTION: renal failure    Current Outpatient Prescriptions on File Prior to Visit  Medication Sig Dispense Refill  . allopurinol (ZYLOPRIM) 300 MG tablet Take 300 mg by mouth daily.      . bimatoprost (LUMIGAN) 0.03 % ophthalmic solution Place 1 drop into both eyes at bedtime.      . brimonidine (ALPHAGAN) 0.15 % ophthalmic solution Place 1 drop into both eyes 2 (two) times daily.        . carvedilol (COREG) 25 MG tablet Take 25 mg by mouth 2 (two) times daily with a meal.      . ezetimibe-simvastatin (VYTORIN) 10-40 MG per tablet Take 1 tablet by mouth at bedtime.      . fluticasone (FLONASE) 50 MCG/ACT nasal spray Place 2 sprays into the nose daily.      . furosemide (LASIX) 40 MG tablet Take 40 mg by mouth daily.      Marland Kitchen gabapentin (NEURONTIN) 300 MG capsule Take 300-600 mg by mouth See admin instructions. He takes one capsule in the morning and two capsules at bedtime.      . isosorbide  mononitrate (IMDUR) 60 MG 24 hr tablet Take 60 mg by mouth daily.      Marland Kitchen levobunolol (BETAGAN) 0.25 % ophthalmic solution Place 1 drop into both eyes at bedtime.        Marland Kitchen lisinopril (PRINIVIL,ZESTRIL) 20 MG tablet Take 20 mg by mouth daily.      Marland Kitchen LORazepam (ATIVAN) 0.5 MG tablet Take 0.5 mg by mouth at bedtime.      . nitroGLYCERIN (NITROSTAT) 0.4 MG SL tablet Place 0.4 mg under the tongue every 5 (five) minutes as needed. For chest pain.      . predniSONE (DELTASONE) 5 MG tablet Take 2.5 mg by mouth every other day.          patient denies chest pain, shortness of breath, orthopnea. Denies lower extremity edema, abdominal pain, change in appetite, change in bowel movements. Patient denies rashes, musculoskeletal complaints. No other specific complaints in a complete review of systems.   BP 118/72  Temp(Src) 97.6 F (36.4 C) (Oral)  Wt 171 lb (77.565 kg) elderly male in no acute  distress.

## 2011-05-28 ENCOUNTER — Other Ambulatory Visit: Payer: Self-pay | Admitting: *Deleted

## 2011-05-28 DIAGNOSIS — D649 Anemia, unspecified: Secondary | ICD-10-CM

## 2011-05-28 NOTE — Progress Notes (Signed)
Quick Note:  Pt informed, labs ordered ______ 

## 2011-06-11 ENCOUNTER — Other Ambulatory Visit (INDEPENDENT_AMBULATORY_CARE_PROVIDER_SITE_OTHER): Payer: Medicare Other

## 2011-06-11 ENCOUNTER — Other Ambulatory Visit: Payer: Medicare Other

## 2011-06-11 ENCOUNTER — Other Ambulatory Visit: Payer: Self-pay | Admitting: Internal Medicine

## 2011-06-11 DIAGNOSIS — D649 Anemia, unspecified: Secondary | ICD-10-CM

## 2011-06-11 DIAGNOSIS — Z87448 Personal history of other diseases of urinary system: Secondary | ICD-10-CM

## 2011-06-11 LAB — URINALYSIS
Bilirubin Urine: NEGATIVE
Hgb urine dipstick: NEGATIVE
Leukocytes, UA: NEGATIVE
Nitrite: NEGATIVE
pH: 7.5 (ref 5.0–8.0)

## 2011-06-11 LAB — BASIC METABOLIC PANEL
BUN: 23 mg/dL (ref 6–23)
GFR: 42.93 mL/min — ABNORMAL LOW (ref >60.00)
Potassium: 4.4 mEq/L (ref 3.5–5.1)

## 2011-06-14 ENCOUNTER — Ambulatory Visit (INDEPENDENT_AMBULATORY_CARE_PROVIDER_SITE_OTHER): Payer: Medicare Other | Admitting: Internal Medicine

## 2011-06-14 VITALS — BP 102/68 | Temp 98.1°F | Wt 167.0 lb

## 2011-06-14 DIAGNOSIS — I2510 Atherosclerotic heart disease of native coronary artery without angina pectoris: Secondary | ICD-10-CM

## 2011-06-14 DIAGNOSIS — I10 Essential (primary) hypertension: Secondary | ICD-10-CM

## 2011-06-14 DIAGNOSIS — N19 Unspecified kidney failure: Secondary | ICD-10-CM

## 2011-06-14 NOTE — Assessment & Plan Note (Signed)
No sx's 

## 2011-06-14 NOTE — Assessment & Plan Note (Signed)
Controlled and off lisinopril Lisinopril was stopped because of marked renal insufficiency Stay off lisinopril

## 2011-06-14 NOTE — Progress Notes (Signed)
Patient ID: [name], male   DOB: 10/27/1924, 78 y.o.   MRN: [id number]  Renal failure--- much improved--- see recent lab work.  CAD-- no sxs  htn- tolerating meds  Past Medical History  Diagnosis Date  . Coronary artery disease   . Stroke     History   Social History  . Marital Status: Widowed    Spouse Name: N/A    Number of Children: N/A  . Years of Education: N/A   Occupational History  . Not on file.   Social History Main Topics  . Smoking status: Former Smoker    Quit date: 01/12/1988  . Smokeless tobacco: Never Used  . Alcohol Use: No  . Drug Use: No  . Sexually Active: Not on file   Other Topics Concern  . Not on file   Social History Narrative  . No narrative on file    Past Surgical History  Procedure Date  . Appendectomy   . Coronary artery bypass graft   . Hemorrhoid surgery   . Knee arthroscopy   . Elbow surgery   . Cataract extraction     x2    Family History  Problem Relation Age of Onset  . Stroke Father     Allergies  Allergen Reactions  . Nsaids     REACTION: renal failure    Current Outpatient Prescriptions on File Prior to Visit  Medication Sig Dispense Refill  . allopurinol (ZYLOPRIM) 300 MG tablet Take 300 mg by mouth daily.      . bimatoprost (LUMIGAN) 0.03 % ophthalmic solution Place 1 drop into both eyes at bedtime.      . brimonidine (ALPHAGAN) 0.15 % ophthalmic solution Place 1 drop into both eyes 2 (two) times daily.        . carvedilol (COREG) 25 MG tablet Take 25 mg by mouth 2 (two) times daily with a meal.      . ezetimibe-simvastatin (VYTORIN) 10-40 MG per tablet Take 1 tablet by mouth at bedtime.      . fluticasone (FLONASE) 50 MCG/ACT nasal spray Place 2 sprays into the nose daily.      . furosemide (LASIX) 40 MG tablet Take 40 mg by mouth daily.      Marland Kitchen gabapentin (NEURONTIN) 300 MG capsule Take 300-600 mg by mouth See admin instructions. He takes one capsule in the morning and two capsules at bedtime.      .  isosorbide mononitrate (IMDUR) 60 MG 24 hr tablet Take 60 mg by mouth daily.      Marland Kitchen levobunolol (BETAGAN) 0.25 % ophthalmic solution Place 1 drop into both eyes at bedtime.        Marland Kitchen lisinopril (PRINIVIL,ZESTRIL) 20 MG tablet Take 20 mg by mouth daily.      Marland Kitchen LORazepam (ATIVAN) 0.5 MG tablet Take 0.25 mg by mouth at bedtime.       . nitroGLYCERIN (NITROSTAT) 0.4 MG SL tablet Place 0.4 mg under the tongue every 5 (five) minutes as needed. For chest pain.      . predniSONE (DELTASONE) 5 MG tablet Take 2.5 mg by mouth every other day.          patient denies chest pain, shortness of breath, orthopnea. Denies lower extremity edema, abdominal pain, change in appetite, change in bowel movements. Patient denies rashes, musculoskeletal complaints. No other specific complaints in a complete review of systems.   BP 102/68  Temp(Src) 98.1 F (36.7 C) (Oral)  Wt 167 lb (75.751 kg)  well-developed well-nourished male in no acute distress. HEENT exam atraumatic, normocephalic, neck supple without jugular venous distention. Chest clear to auscultation cardiac exam S1-S2 are regular. Abdominal exam overweight with bowel sounds, soft and nontender. Extremities no edema. Neurologic exam is alert with a normal gait.

## 2011-06-14 NOTE — Assessment & Plan Note (Signed)
Creatinine is back to baseline Continue current meds (off lisinopril)

## 2011-07-20 ENCOUNTER — Other Ambulatory Visit: Payer: Self-pay | Admitting: *Deleted

## 2011-07-20 MED ORDER — LORAZEPAM 0.5 MG PO TABS: 0.2500 mg | ORAL_TABLET | Freq: Every day | ORAL | Status: DC

## 2011-08-02 ENCOUNTER — Telehealth: Payer: Self-pay | Admitting: Internal Medicine

## 2011-08-02 NOTE — Telephone Encounter (Signed)
Pt is returning call. Pls call back asap.  °

## 2011-08-03 NOTE — Telephone Encounter (Signed)
Don't see where anyone called pt.  Pt said he voicemail on machine to call here but no name was left.  Told pt we must of called in error.

## 2011-08-29 ENCOUNTER — Other Ambulatory Visit: Payer: Self-pay | Admitting: Internal Medicine

## 2011-09-20 ENCOUNTER — Other Ambulatory Visit: Payer: Self-pay | Admitting: Internal Medicine

## 2011-10-14 ENCOUNTER — Ambulatory Visit: Payer: Medicare Other | Admitting: Internal Medicine

## 2011-10-15 ENCOUNTER — Ambulatory Visit (INDEPENDENT_AMBULATORY_CARE_PROVIDER_SITE_OTHER): Payer: Medicare Other | Admitting: Internal Medicine

## 2011-10-15 VITALS — BP 144/76 | HR 54 | Temp 98.0°F | Wt 160.0 lb

## 2011-10-15 DIAGNOSIS — I2510 Atherosclerotic heart disease of native coronary artery without angina pectoris: Secondary | ICD-10-CM

## 2011-10-15 DIAGNOSIS — I10 Essential (primary) hypertension: Secondary | ICD-10-CM

## 2011-10-15 DIAGNOSIS — E785 Hyperlipidemia, unspecified: Secondary | ICD-10-CM

## 2011-10-15 DIAGNOSIS — Z23 Encounter for immunization: Secondary | ICD-10-CM

## 2011-10-15 DIAGNOSIS — N19 Unspecified kidney failure: Secondary | ICD-10-CM

## 2011-10-15 LAB — LIPID PANEL
Cholesterol: 138 mg/dL (ref 0–200)
Triglycerides: 120 mg/dL (ref 0.0–149.0)

## 2011-10-15 LAB — BASIC METABOLIC PANEL
Calcium: 9.5 mg/dL (ref 8.4–10.5)
Creatinine, Ser: 1.7 mg/dL — ABNORMAL HIGH (ref 0.4–1.5)
Sodium: 137 mEq/L (ref 135–145)

## 2011-10-15 LAB — HEPATIC FUNCTION PANEL
ALT: 17 U/L (ref 0–53)
AST: 22 U/L (ref 0–37)
Albumin: 4 g/dL (ref 3.5–5.2)
Alkaline Phosphatase: 82 U/L (ref 39–117)
Total Protein: 7.4 g/dL (ref 6.0–8.3)

## 2011-10-15 NOTE — Assessment & Plan Note (Signed)
Check labs today.

## 2011-10-15 NOTE — Assessment & Plan Note (Signed)
No sxs Continue risk factor modification 

## 2011-10-15 NOTE — Progress Notes (Signed)
Patient ID: [name], male   DOB: [birth year], 78 y.o.   MRN: [id number]  Back pain, shoulder pain-- seeing dr duda. CAD- no sxs, tolerating meds and not having any sxs Gout- no recurence Lipids- tolerating meds  Past Medical History  Diagnosis Date  . Coronary artery disease   . Stroke     History   Social History  . Marital Status: Widowed    Spouse Name: N/A    Number of Children: N/A  . Years of Education: N/A   Occupational History  . Not on file.   Social History Main Topics  . Smoking status: Former Smoker    Quit date: 01/12/1988  . Smokeless tobacco: Never Used  . Alcohol Use: No  . Drug Use: No  . Sexually Active: Not on file   Other Topics Concern  . Not on file   Social History Narrative  . No narrative on file    Past Surgical History  Procedure Date  . Appendectomy   . Coronary artery bypass graft   . Hemorrhoid surgery   . Knee arthroscopy   . Elbow surgery   . Cataract extraction     x2    Family History  Problem Relation Age of Onset  . Stroke Father     Allergies  Allergen Reactions  . Nsaids     REACTION: renal failure    Current Outpatient Prescriptions on File Prior to Visit  Medication Sig Dispense Refill  . allopurinol (ZYLOPRIM) 300 MG tablet Take 300 mg by mouth daily.      . bimatoprost (LUMIGAN) 0.03 % ophthalmic solution Place 1 drop into both eyes at bedtime.      . brimonidine (ALPHAGAN) 0.15 % ophthalmic solution Place 1 drop into both eyes 2 (two) times daily.        . carvedilol (COREG) 25 MG tablet Take 25 mg by mouth 2 (two) times daily with a meal.      . ezetimibe-simvastatin (VYTORIN) 10-40 MG per tablet Take 1 tablet by mouth at bedtime.      . fluticasone (FLONASE) 50 MCG/ACT nasal spray Place 2 sprays into the nose daily.      . furosemide (LASIX) 40 MG tablet Take 40 mg by mouth daily.      Marland Kitchen gabapentin (NEURONTIN) 300 MG capsule Take 300-600 mg by mouth See admin instructions. He takes one capsule in the  morning and two capsules at bedtime.      . gabapentin (NEURONTIN) 300 MG capsule TAKE 1 CAPSULE BY MOUTH TWICE DAILY FOR 7 DAYS. THEN TAKE 1 CAPSULE INTHE MORNING AND 2 AT NIGHT.  90 capsule  2  . isosorbide mononitrate (IMDUR) 60 MG 24 hr tablet Take 60 mg by mouth daily.      . isosorbide mononitrate (IMDUR) 60 MG 24 hr tablet TAKE ONE TABLET BY MOUTH ONE TIME DAILY  90 tablet  0  . levobunolol (BETAGAN) 0.25 % ophthalmic solution Place 1 drop into both eyes at bedtime.        Marland Kitchen LORazepam (ATIVAN) 0.5 MG tablet Take 0.5 tablets (0.25 mg total) by mouth at bedtime.  30 tablet  3  . nitroGLYCERIN (NITROSTAT) 0.4 MG SL tablet Place 0.4 mg under the tongue every 5 (five) minutes as needed. For chest pain.      . predniSONE (DELTASONE) 5 MG tablet Take 2.5 mg by mouth every other day.          patient denies chest pain, shortness of  breath, orthopnea. Denies lower extremity edema, abdominal pain, change in appetite, change in bowel movements. Patient denies rashes, musculoskeletal complaints. No other specific complaints in a complete review of systems.   BP 180/74  Pulse 54  Temp 98 F (36.7 C) (Oral)  Wt 160 lb (72.576 kg)  SpO2 95%  well-developed well-nourished male in no acute distress. HEENT exam atraumatic, normocephalic, neck supple without jugular venous distention. Chest clear to auscultation cardiac exam S1-S2 are regular. Abdominal exam overweight with bowel sounds, soft and nontender.

## 2011-10-15 NOTE — Assessment & Plan Note (Signed)
BP is better- i've asked him to monitor

## 2011-10-18 ENCOUNTER — Other Ambulatory Visit (INDEPENDENT_AMBULATORY_CARE_PROVIDER_SITE_OTHER): Payer: Medicare Other

## 2011-10-18 DIAGNOSIS — R300 Dysuria: Secondary | ICD-10-CM

## 2011-10-18 LAB — POCT URINALYSIS DIPSTICK
Glucose, UA: NEGATIVE
Nitrite, UA: NEGATIVE
Urobilinogen, UA: 0.2
pH, UA: 5

## 2011-12-01 ENCOUNTER — Other Ambulatory Visit: Payer: Self-pay | Admitting: *Deleted

## 2011-12-01 DIAGNOSIS — Z48812 Encounter for surgical aftercare following surgery on the circulatory system: Secondary | ICD-10-CM

## 2011-12-01 DIAGNOSIS — I6529 Occlusion and stenosis of unspecified carotid artery: Secondary | ICD-10-CM

## 2011-12-07 ENCOUNTER — Encounter: Payer: Self-pay | Admitting: Neurosurgery

## 2011-12-08 ENCOUNTER — Encounter: Payer: Self-pay | Admitting: Neurosurgery

## 2011-12-08 ENCOUNTER — Ambulatory Visit (INDEPENDENT_AMBULATORY_CARE_PROVIDER_SITE_OTHER): Payer: Medicare Other | Admitting: Neurosurgery

## 2011-12-08 ENCOUNTER — Other Ambulatory Visit (INDEPENDENT_AMBULATORY_CARE_PROVIDER_SITE_OTHER): Payer: Medicare Other | Admitting: *Deleted

## 2011-12-08 VITALS — BP 127/40 | HR 50 | Ht 72.0 in | Wt 170.0 lb

## 2011-12-08 DIAGNOSIS — I6529 Occlusion and stenosis of unspecified carotid artery: Secondary | ICD-10-CM

## 2011-12-08 DIAGNOSIS — Z48812 Encounter for surgical aftercare following surgery on the circulatory system: Secondary | ICD-10-CM

## 2011-12-08 NOTE — Addendum Note (Signed)
Addended by: Sharee Pimple on: 12/08/2011 02:35 PM   Modules accepted: Orders

## 2011-12-08 NOTE — Progress Notes (Signed)
[facility] Carotid Office Note  CC: Carotid surveillance Referring Physician: Early  History of Present Illness: 78 year old male patient of Dr. Arbie Cookey status post bilateral CEAs in 2006 with Dr. Madilyn Fireman. The patient denies any signs or symptoms of CVA, TIA, amaurosis fugax or any neural deficit. The patient denies any new medical diagnoses or recent surgery.  Past Medical History  Diagnosis Date  . Coronary artery disease   . Stroke     ROS: [x]  Positive   [ ]  Denies    General: [ ]  Weight loss, [ ]  Fever, [ ]  chills Neurologic: [ ]  Dizziness, [ ]  Blackouts, [ ]  Seizure [ ]  Stroke, [ ]  "Mini stroke", [ ]  Slurred speech, [ ]  Temporary blindness; [ ]  weakness in arms or legs, [ ]  Hoarseness Cardiac: [ ]  Chest pain/pressure, [ ]  Shortness of breath at rest [ ]  Shortness of breath with exertion, [ ]  Atrial fibrillation or irregular heartbeat Vascular: [ ]  Pain in legs with walking, [ ]  Pain in legs at rest, [ ]  Pain in legs at night,  [ ]  Non-healing ulcer, [ ]  Blood clot in vein/DVT,   Pulmonary: [ ]  Home oxygen, [ ]  Productive cough, [ ]  Coughing up blood, [ ]  Asthma,  [ ]  Wheezing Musculoskeletal:  [ ]  Arthritis, [ ]  Low back pain, [ ]  Joint pain Hematologic: [ ]  Easy Bruising, [ ]  Anemia; [ ]  Hepatitis Gastrointestinal: [ ]  Blood in stool, [ ]  Gastroesophageal Reflux/heartburn, [ ]  Trouble swallowing Urinary: [ ]  chronic Kidney disease, [ ]  on HD - [ ]  MWF or [ ]  TTHS, [ ]  Burning with urination, [ ]  Difficulty urinating Skin: [ ]  Rashes, [ ]  Wounds Psychological: [ ]  Anxiety, [ ]  Depression   Social History History  Substance Use Topics  . Smoking status: Former Smoker    Quit date: 01/12/1988  . Smokeless tobacco: Never Used  . Alcohol Use: No    Family History Family History  Problem Relation Age of Onset  . Stroke Father     Allergies  Allergen Reactions  . Nsaids     REACTION: renal failure    Current Outpatient Prescriptions    Medication Sig Dispense Refill  . allopurinol (ZYLOPRIM) 300 MG tablet Take 300 mg by mouth daily.      . bimatoprost (LUMIGAN) 0.03 % ophthalmic solution Place 1 drop into both eyes at bedtime.      . brimonidine (ALPHAGAN) 0.15 % ophthalmic solution Place 1 drop into both eyes 2 (two) times daily.        . carvedilol (COREG) 25 MG tablet Take 25 mg by mouth 2 (two) times daily with a meal.      . ezetimibe-simvastatin (VYTORIN) 10-40 MG per tablet Take 1 tablet by mouth at bedtime.      . fluticasone (FLONASE) 50 MCG/ACT nasal spray Place 2 sprays into the nose daily.      . furosemide (LASIX) 40 MG tablet Take 40 mg by mouth daily.      Marland Kitchen gabapentin (NEURONTIN) 300 MG capsule Take 300-600 mg by mouth See admin instructions. He takes one capsule in the morning and two capsules at bedtime.      . gabapentin (NEURONTIN) 300 MG capsule TAKE 1 CAPSULE BY MOUTH TWICE DAILY FOR 7 DAYS. THEN TAKE 1 CAPSULE INTHE MORNING AND 2 AT NIGHT.  90 capsule  2  . isosorbide mononitrate (IMDUR) 60 MG 24 hr tablet Take 60 mg by mouth daily.      Marland Kitchen  isosorbide mononitrate (IMDUR) 60 MG 24 hr tablet TAKE ONE TABLET BY MOUTH ONE TIME DAILY  90 tablet  0  . levobunolol (BETAGAN) 0.25 % ophthalmic solution Place 1 drop into both eyes at bedtime.        Marland Kitchen LORazepam (ATIVAN) 0.5 MG tablet Take 0.5 tablets (0.25 mg total) by mouth at bedtime.  30 tablet  3  . nitroGLYCERIN (NITROSTAT) 0.4 MG SL tablet Place 0.4 mg under the tongue every 5 (five) minutes as needed. For chest pain.      . predniSONE (DELTASONE) 5 MG tablet Take 2.5 mg by mouth every other day.         Physical Examination  There were no vitals filed for this visit.  There is no height or weight on file to calculate BMI.  General:  WDWN in NAD Gait: Normal HEENT: WNL Eyes: Pupils equal Pulmonary: normal non-labored breathing , without Rales, rhonchi,  wheezing Cardiac: RRR, without  Murmurs, rubs or gallops; Abdomen: soft, NT, no masses Skin: no  rashes, ulcers noted  Vascular Exam Pulses: 2+ radial pulses bilateral Carotid bruits: Carotid pulses to auscultation no bruits are heard Extremities without ischemic changes, no Gangrene , no cellulitis; no open wounds;  Musculoskeletal: no muscle wasting or atrophy   Neurologic: A&O X 3; Appropriate Affect ; SENSATION: normal; MOTOR FUNCTION:  moving all extremities equally. Speech is fluent/normal  Non-Invasive Vascular Imaging CAROTID DUPLEX 12/08/2011  Right ICA 20 - 39 % stenosis Left ICA 20 - 39 % stenosis   ASSESSMENT/PLAN: Asymptomatic patient with stable carotid duplex when compared one year ago. The patient will followup in one year with repeat carotid duplex, his questions were encouraged and answered, he is in agreement with this plan.  Lauree Chandler ANP   Clinic MD: Early on call

## 2011-12-14 ENCOUNTER — Inpatient Hospital Stay (HOSPITAL_COMMUNITY)
Admission: EM | Admit: 2011-12-14 | Discharge: 2011-12-20 | DRG: 394 | Disposition: A | Discharge status: Home Health | Payer: Medicare Other | Attending: Emergency Medicine | Admitting: Emergency Medicine

## 2011-12-14 ENCOUNTER — Encounter (HOSPITAL_COMMUNITY): Payer: Self-pay | Admitting: Emergency Medicine

## 2011-12-14 DIAGNOSIS — Z66 Do not resuscitate: Secondary | ICD-10-CM | POA: Diagnosis present

## 2011-12-14 DIAGNOSIS — E875 Hyperkalemia: Secondary | ICD-10-CM | POA: Diagnosis present

## 2011-12-14 DIAGNOSIS — N19 Unspecified kidney failure: Secondary | ICD-10-CM

## 2011-12-14 DIAGNOSIS — IMO0002 Reserved for concepts with insufficient information to code with codable children: Secondary | ICD-10-CM

## 2011-12-14 DIAGNOSIS — R310 Gross hematuria: Secondary | ICD-10-CM | POA: Diagnosis not present

## 2011-12-14 DIAGNOSIS — K5289 Other specified noninfective gastroenteritis and colitis: Secondary | ICD-10-CM | POA: Diagnosis present

## 2011-12-14 DIAGNOSIS — Z79899 Other long term (current) drug therapy: Secondary | ICD-10-CM

## 2011-12-14 DIAGNOSIS — K55059 Acute (reversible) ischemia of intestine, part and extent unspecified: Principal | ICD-10-CM | POA: Diagnosis present

## 2011-12-14 DIAGNOSIS — N179 Acute kidney failure, unspecified: Secondary | ICD-10-CM | POA: Diagnosis present

## 2011-12-14 DIAGNOSIS — R031 Nonspecific low blood-pressure reading: Secondary | ICD-10-CM | POA: Diagnosis present

## 2011-12-14 DIAGNOSIS — N189 Chronic kidney disease, unspecified: Secondary | ICD-10-CM | POA: Diagnosis present

## 2011-12-14 DIAGNOSIS — Z951 Presence of aortocoronary bypass graft: Secondary | ICD-10-CM

## 2011-12-14 DIAGNOSIS — D649 Anemia, unspecified: Secondary | ICD-10-CM

## 2011-12-14 DIAGNOSIS — Z87891 Personal history of nicotine dependence: Secondary | ICD-10-CM

## 2011-12-14 DIAGNOSIS — K529 Noninfective gastroenteritis and colitis, unspecified: Secondary | ICD-10-CM

## 2011-12-14 DIAGNOSIS — Z8673 Personal history of transient ischemic attack (TIA), and cerebral infarction without residual deficits: Secondary | ICD-10-CM

## 2011-12-14 DIAGNOSIS — M109 Gout, unspecified: Secondary | ICD-10-CM | POA: Diagnosis present

## 2011-12-14 DIAGNOSIS — I129 Hypertensive chronic kidney disease with stage 1 through stage 4 chronic kidney disease, or unspecified chronic kidney disease: Secondary | ICD-10-CM | POA: Diagnosis present

## 2011-12-14 DIAGNOSIS — R339 Retention of urine, unspecified: Secondary | ICD-10-CM

## 2011-12-14 DIAGNOSIS — I2510 Atherosclerotic heart disease of native coronary artery without angina pectoris: Secondary | ICD-10-CM

## 2011-12-14 DIAGNOSIS — N289 Disorder of kidney and ureter, unspecified: Secondary | ICD-10-CM

## 2011-12-14 HISTORY — DX: Benign prostatic hyperplasia without lower urinary tract symptoms: N40.0

## 2011-12-14 MED ORDER — SODIUM CHLORIDE 0.9 % IV BOLUS (SEPSIS)
1000.0000 mL | Freq: Once | INTRAVENOUS | Status: AC
  Administered 2011-12-15: 1000 mL via INTRAVENOUS

## 2011-12-14 MED ORDER — ONDANSETRON HCL 4 MG/2ML IJ SOLN
4.0000 mg | Freq: Once | INTRAMUSCULAR | Status: AC
  Administered 2011-12-15: 4 mg via INTRAVENOUS
  Filled 2011-12-14: qty 2

## 2011-12-14 NOTE — ED Notes (Signed)
Pt brought to ED by [facility], called EMS for fall from toilet after sitting for a long period of time, pt states was weak upon standing, denies any injuries.  Pt presents with diarrhea that began this evening, one episode of vomiting.  Pt also complaining of urinary frequency today.

## 2011-12-14 NOTE — ED Provider Notes (Signed)
History     CSN: [id number]  Arrival date & time 12/14/11  2333   First MD Initiated Contact with Patient 12/14/11 2340      Chief Complaint  Patient presents with  . Nausea  . Emesis  . Diarrhea    (Consider location/radiation/quality/duration/timing/severity/associated sxs/prior treatment) HPI Comments: Patient arrives via EMS with several complaints. He states he's had difficulty urinating all day feeling pressure in her lower abdomen. He denies a history of prostate problems but has required a catheter in the past. While sitting on the toilet today he developed cramps in his legs and had a fall. He denies losing consciousness denies hitting his head or look at any other injuries. His main complaint is diarrhea that onset within the past hour that is loose watery and brown. Denies blood in the diarrhea. Had 2 episodes of vomiting and complains of nausea. No fever or recent antibiotic use. No sick contacts. Denies any chest pain or shortness of breath. Does feel dizzy with standing.  The history is provided by the patient and the EMS personnel.    Past Medical History  Diagnosis Date  . Coronary artery disease   . Stroke     Past Surgical History  Procedure Date  . Appendectomy   . Coronary artery bypass graft   . Hemorrhoid surgery   . Knee arthroscopy   . Elbow surgery   . Cataract extraction     x2    Family History  Problem Relation Age of Onset  . Stroke Father     History  Substance Use Topics  . Smoking status: Former Smoker    Quit date: 01/12/1988  . Smokeless tobacco: Never Used  . Alcohol Use: No      Review of Systems  Constitutional: Positive for activity change, appetite change and fatigue. Negative for fever.  HENT: Negative for congestion and rhinorrhea.   Respiratory: Negative for cough, chest tightness and shortness of breath.   Cardiovascular: Negative for chest pain.  Gastrointestinal: Positive for nausea, vomiting, abdominal pain and  diarrhea.  Genitourinary: Positive for decreased urine volume. Negative for dysuria.  Musculoskeletal: Negative for back pain.  Neurological: Positive for weakness. Negative for dizziness and headaches.    Allergies  Nsaids  Home Medications   Current Outpatient Rx  Name  Route  Sig  Dispense  Refill  . ALLOPURINOL 300 MG PO TABS   Oral   Take 300 mg by mouth daily.         Marland Kitchen CARVEDILOL 25 MG PO TABS   Oral   Take 25 mg by mouth 2 (two) times daily with a meal.         . EZETIMIBE-SIMVASTATIN 10-40 MG PO TABS   Oral   Take 1 tablet by mouth at bedtime.         Marland Kitchen FLUTICASONE PROPIONATE 50 MCG/ACT NA SUSP   Nasal   Place 2 sprays into the nose daily.         . FUROSEMIDE 40 MG PO TABS   Oral   Take 40 mg by mouth daily.         Marland Kitchen GABAPENTIN 300 MG PO CAPS   Oral   Take 300-600 mg by mouth 2 (two) times daily. He takes one capsule in the morning and two capsules at bedtime.         . ISOSORBIDE DINITRATE 20 MG PO TABS   Oral   Take 20 mg by mouth daily.         Marland Kitchen  LEVOBUNOLOL HCL 0.25 % OP SOLN   Both Eyes   Place 1 drop into both eyes at bedtime.           Marland Kitchen LISINOPRIL 5 MG PO TABS   Oral   Take 5 mg by mouth daily.         Marland Kitchen LORAZEPAM 0.5 MG PO TABS   Oral   Take 0.5 tablets (0.25 mg total) by mouth at bedtime.   30 tablet   3   . NITROGLYCERIN 0.4 MG SL SUBL   Sublingual   Place 0.4 mg under the tongue every 5 (five) minutes as needed. For chest pain.         Marland Kitchen PREDNISONE 5 MG PO TABS   Oral   Take 2.5 mg by mouth every Monday, Wednesday, and Friday.          . TOBRAMYCIN-DEXAMETHASONE 0.3-0.05 % OP SUSP   Ophthalmic   Apply 4 drops to eye daily.           BP 107/44  Pulse 54  Temp 98.4 F (36.9 C) (Oral)  Resp 13  SpO2 100%  Physical Exam  Constitutional: He is oriented to person, place, and time. He appears well-developed and well-nourished.  HENT:  Head: Normocephalic and atraumatic.  Mouth/Throat: Oropharynx is  clear and moist. No oropharyngeal exudate.  Eyes: Conjunctivae normal and EOM are normal. Pupils are equal, round, and reactive to light.  Neck: Normal range of motion. Neck supple.  Cardiovascular: Normal rate, regular rhythm and normal heart sounds.   No murmur heard. Pulmonary/Chest: Effort normal and breath sounds normal. No respiratory distress.  Abdominal: Soft. There is tenderness. There is guarding.       Palpable bladder, lower abdominal tenderness with guarding  Genitourinary:       Brown stool, normal rectal tone  Musculoskeletal: Normal range of motion. He exhibits no edema and no tenderness.  Neurological: He is alert and oriented to person, place, and time. No cranial nerve deficit. He exhibits normal muscle tone. Coordination normal.       Great toe extension intact bilaterally.  5/5 strength in bilateral lower extremities.  Skin: Skin is warm.    ED Course  Procedures (including critical care time)  Labs Reviewed  CBC WITH DIFFERENTIAL - Abnormal; Notable for the following:    WBC 11.4 (*)     RBC 3.96 (*)     Hemoglobin 12.9 (*)     HCT 38.6 (*)     Neutro Abs 8.5 (*)     All other components within normal limits  COMPREHENSIVE METABOLIC PANEL - Abnormal; Notable for the following:    Potassium 5.8 (*)     Glucose, Bld 107 (*)     BUN 39 (*)     Creatinine, Ser 2.38 (*)     GFR calc non Af Amer 23 (*)     GFR calc Af Amer 27 (*)     All other components within normal limits  URINALYSIS, ROUTINE W REFLEX MICROSCOPIC - Abnormal; Notable for the following:    APPearance CLOUDY (*)     Hgb urine dipstick MODERATE (*)     Leukocytes, UA SMALL (*)     All other components within normal limits  LACTIC ACID, PLASMA  LIPASE, BLOOD  TROPONIN I  OCCULT BLOOD, POC DEVICE  URINE MICROSCOPIC-ADD ON  URINE CULTURE   Ct Abdomen Pelvis Wo Contrast  12/15/2011  *RADIOLOGY REPORT*  Clinical Data: Nausea, vomiting and diarrhea; syncope.  Elevated creatinine.  CT ABDOMEN  AND PELVIS WITHOUT CONTRAST  Technique:  Multidetector CT imaging of the abdomen and pelvis was performed following the standard protocol without intravenous contrast.  Comparison: CT of the abdomen and pelvis performed 05/09/2011  Findings: Mild patchy opacities at the lung bases likely reflect atelectasis, though mild pneumonia cannot be excluded.  Underlying emphysematous change is again seen.  Scattered coronary artery calcifications are seen.  Calcification is noted along the aortic valve.  The patient is status post median sternotomy.  Small calcified granulomata are noted within the liver.  The spleen is within normal limits.  Foci of increased attenuation within the gallbladder likely reflect small stones layering dependently; the gallbladder is otherwise unremarkable in appearance.  The pancreas and adrenal glands are unremarkable in appearance.  Diffuse perinephric stranding and fluid is noted bilaterally.  A 2.6 cm cyst is noted at the interpole region of the right kidney. The kidneys are otherwise unremarkable in appearance.  There is no evidence of hydronephrosis.  No renal or ureteral stones are seen.  The small bowel is unremarkable in appearance.  The stomach is filled with contrast and is within normal limits.  No acute vascular abnormalities are seen.  Diffuse calcification is noted along the abdominal aorta and its branches, particularly along the proximal right renal artery.  There is mild ectasia of the mid to distal abdominal aorta.  The patient is status post appendectomy.  There is diffuse soft tissue inflammation and wall thickening noted along the descending and sigmoid colon, compatible with colitis.  Scattered associated inflamed diverticula are noted along the sigmoid colon. Trace associated free fluid is seen.  The bladder is decompressed, with a Foley catheter in place. Apparent bladder wall thickening may reflect decompression.  The prostate is enlarged, measuring 5.6 cm in transverse  dimension.  No inguinal lymphadenopathy is seen.  No acute osseous abnormalities are identified. There is multilevel disc space narrowing along the lumbar spine, with grade 1 retrolisthesis of L1 on L2, grade 1 retrolisthesis of L2 on L3, and grade 1 anterolisthesis of L4 on L5.  Vacuum phenomenon is noted at multiple levels along the lumbar spine.  IMPRESSION:  1.  Diffuse soft tissue inflammation and wall thickening along the descending and sigmoid colon, compatible with acute colitis. Scattered associated inflamed diverticula noted along the sigmoid colon.  Trace associated free fluid seen.  No evidence of perforation or abscess formation. 2.  Mild patchy opacities at the lung bases likely reflect atelectasis, though mild pneumonia cannot be excluded.  Underlying emphysematous change again noted. 3.  Scattered coronary artery calcifications seen. 4.  Degenerative change noted along the lumbar spine. 5.  Diffuse calcification along the abdominal aorta and its branches, particularly along the right renal artery. 6.  Enlarged prostate. 7.  Right renal cyst; bilateral perinephric stranding and fluid are similar in appearance to the prior study, though perhaps slightly more pronounced.   Original Report Authenticated By: Tonia Ghent, M.D.    Dg Chest 2 View  12/15/2011  *RADIOLOGY REPORT*  Clinical Data: Status post fall; concern for chest injury.  History of left-sided chest shrapnel.  CHEST - 2 VIEW  Comparison: Chest radiograph performed 05/09/2011  Findings: The lungs are well-aerated.  Mild bibasilar atelectasis is noted.  There is no evidence of pleural effusion or pneumothorax.  The heart is normal in size; the patient is status post median sternotomy, with evidence of prior CABG.  A vascular stent is noted overlying the superior mediastinum.  No acute osseous abnormalities are seen.  There is question of minimal anterior wedging at the lower thoracic spine; this is less well characterized on the prior  study, though likely chronic in nature.  Degenerative change is noted at both glenohumeral joints, with associated sclerosis.  A few tiny metallic densities are noted on both sides of the chest.  IMPRESSION: Mild bibasilar atelectasis noted; lungs otherwise clear.  No displaced rib fractures seen.   Original Report Authenticated By: Tonia Ghent, M.D.      1. Colitis   2. Renal insufficiency   3. Hyperkalemia   4. Urinary retention       MDM  Presents to ED with chief complaint urinary retention. About one hour ago patient developed profuse diarrhea and had a fall sitting on the toilet. Denies losing consciousness and denies passing out. States a cramp in his leg it to him by surprise and he fell. Denies injury from the fall. Hit one episode of vomiting as well.  Palpable bladder with diffuse lower abdominal tenderness and guarding. Foley placed on arrival with 800 cc of dark urine. Mild renal insufficiency with hyperkalemia. EKG is poor quality but no apparent hyperkalemic changes.  Cipro/flagyl given for colitis on CT. Doubt ischemic colitis though BP was low on admission. Lactate normal. BP improving with fluids.      Date: 12/15/2011  Rate: 61  Rhythm: normal sinus rhythm  QRS Axis: normal  Intervals: normal  ST/T Wave abnormalities: nonspecific ST/T changes  Conduction Disutrbances:right bundle branch block  Narrative Interpretation:   Old EKG Reviewed: changes noted    Glynn Octave, MD 12/15/11 1531

## 2011-12-15 ENCOUNTER — Encounter (HOSPITAL_COMMUNITY): Payer: Self-pay | Admitting: Radiology

## 2011-12-15 ENCOUNTER — Emergency Department (HOSPITAL_COMMUNITY): Payer: Medicare Other

## 2011-12-15 ENCOUNTER — Inpatient Hospital Stay (HOSPITAL_COMMUNITY): Payer: Medicare Other

## 2011-12-15 DIAGNOSIS — N19 Unspecified kidney failure: Secondary | ICD-10-CM

## 2011-12-15 DIAGNOSIS — I2510 Atherosclerotic heart disease of native coronary artery without angina pectoris: Secondary | ICD-10-CM

## 2011-12-15 DIAGNOSIS — M109 Gout, unspecified: Secondary | ICD-10-CM

## 2011-12-15 DIAGNOSIS — K5289 Other specified noninfective gastroenteritis and colitis: Secondary | ICD-10-CM

## 2011-12-15 DIAGNOSIS — R339 Retention of urine, unspecified: Secondary | ICD-10-CM

## 2011-12-15 DIAGNOSIS — E875 Hyperkalemia: Secondary | ICD-10-CM

## 2011-12-15 LAB — COMPREHENSIVE METABOLIC PANEL
ALT: 13 U/L (ref 0–53)
Albumin: 4 g/dL (ref 3.5–5.2)
Alkaline Phosphatase: 106 U/L (ref 39–117)
Potassium: 5.8 mEq/L — ABNORMAL HIGH (ref 3.5–5.1)
Sodium: 138 mEq/L (ref 135–145)
Total Protein: 7.2 g/dL (ref 6.0–8.3)

## 2011-12-15 LAB — CBC WITH DIFFERENTIAL/PLATELET
Basophils Relative: 1 % (ref 0–1)
Eosinophils Absolute: 0.5 10*3/uL (ref 0.0–0.7)
Lymphs Abs: 1.7 10*3/uL (ref 0.7–4.0)
MCH: 32.6 pg (ref 26.0–34.0)
MCHC: 33.4 g/dL (ref 30.0–36.0)
Neutro Abs: 8.5 10*3/uL — ABNORMAL HIGH (ref 1.7–7.7)
Neutrophils Relative %: 75 % (ref 43–77)
Platelets: 217 10*3/uL (ref 150–400)
RBC: 3.96 MIL/uL — ABNORMAL LOW (ref 4.22–5.81)

## 2011-12-15 LAB — TROPONIN I: Troponin I: <0.3 ng/mL (ref <0.30)

## 2011-12-15 LAB — URINALYSIS, ROUTINE W REFLEX MICROSCOPIC
Glucose, UA: NEGATIVE mg/dL
Specific Gravity, Urine: 1.015 (ref 1.005–1.030)
pH: 5.5 (ref 5.0–8.0)

## 2011-12-15 LAB — BASIC METABOLIC PANEL
CO2: 20 mEq/L (ref 19–32)
Chloride: 106 mEq/L (ref 96–112)
Creatinine, Ser: 2.32 mg/dL — ABNORMAL HIGH (ref 0.50–1.35)
GFR calc Af Amer: 28 mL/min — ABNORMAL LOW (ref >90)
Potassium: 5 mEq/L (ref 3.5–5.1)
Sodium: 136 mEq/L (ref 135–145)

## 2011-12-15 LAB — URINE MICROSCOPIC-ADD ON

## 2011-12-15 LAB — OCCULT BLOOD, POC DEVICE: Fecal Occult Bld: POSITIVE

## 2011-12-15 MED ORDER — SODIUM CHLORIDE 0.9 % IV SOLN
INTRAVENOUS | Status: DC
  Administered 2011-12-15 – 2011-12-16 (×3): via INTRAVENOUS
  Administered 2011-12-17: 1000 mL via INTRAVENOUS

## 2011-12-15 MED ORDER — SODIUM CHLORIDE 0.9 % IV BOLUS (SEPSIS)
1000.0000 mL | Freq: Once | INTRAVENOUS | Status: AC
  Administered 2011-12-15: 1000 mL via INTRAVENOUS

## 2011-12-15 MED ORDER — PREDNISONE 2.5 MG PO TABS
2.5000 mg | ORAL_TABLET | ORAL | Status: DC
  Administered 2011-12-15 – 2011-12-20 (×3): 2.5 mg via ORAL
  Filled 2011-12-15 (×3): qty 1

## 2011-12-15 MED ORDER — METRONIDAZOLE IN NACL 5-0.79 MG/ML-% IV SOLN
500.0000 mg | Freq: Once | INTRAVENOUS | Status: AC
  Administered 2011-12-15: 500 mg via INTRAVENOUS
  Filled 2011-12-15: qty 100

## 2011-12-15 MED ORDER — LORAZEPAM 0.5 MG PO TABS
0.2500 mg | ORAL_TABLET | Freq: Every day | ORAL | Status: DC
  Administered 2011-12-15 – 2011-12-19 (×5): 0.25 mg via ORAL
  Filled 2011-12-15 (×5): qty 1

## 2011-12-15 MED ORDER — GABAPENTIN 600 MG PO TABS
600.0000 mg | ORAL_TABLET | Freq: Every day | ORAL | Status: DC
  Administered 2011-12-15 – 2011-12-19 (×5): 600 mg via ORAL
  Filled 2011-12-15 (×8): qty 1

## 2011-12-15 MED ORDER — TOBRAMYCIN-DEXAMETHASONE 0.3-0.05 % OP SUSP: 4.0000 [drp] | Freq: Every day | OPHTHALMIC | Status: DC

## 2011-12-15 MED ORDER — METRONIDAZOLE IN NACL 5-0.79 MG/ML-% IV SOLN
500.0000 mg | Freq: Three times a day (TID) | INTRAVENOUS | Status: DC
  Administered 2011-12-15 – 2011-12-17 (×6): 500 mg via INTRAVENOUS
  Filled 2011-12-15 (×9): qty 100

## 2011-12-15 MED ORDER — ONDANSETRON HCL 4 MG/2ML IJ SOLN
4.0000 mg | Freq: Three times a day (TID) | INTRAMUSCULAR | Status: DC | PRN
Start: 2011-12-15 — End: 2011-12-15

## 2011-12-15 MED ORDER — IOHEXOL 300 MG/ML  SOLN
20.0000 mL | INTRAMUSCULAR | Status: DC
  Administered 2011-12-15: 20 mL via ORAL

## 2011-12-15 MED ORDER — TAMSULOSIN HCL 0.4 MG PO CAPS
0.4000 mg | ORAL_CAPSULE | Freq: Every day | ORAL | Status: DC
  Administered 2011-12-15: 0.4 mg via ORAL
  Filled 2011-12-15 (×3): qty 1

## 2011-12-15 MED ORDER — GABAPENTIN 300 MG PO CAPS: 300.0000 mg | ORAL_CAPSULE | Freq: Two times a day (BID) | ORAL | Status: DC

## 2011-12-15 MED ORDER — ISOSORBIDE DINITRATE 20 MG PO TABS: 20.0000 mg | ORAL_TABLET | Freq: Every day | ORAL | Status: DC

## 2011-12-15 MED ORDER — GABAPENTIN 300 MG PO CAPS
300.0000 mg | ORAL_CAPSULE | Freq: Every day | ORAL | Status: DC
  Administered 2011-12-15 – 2011-12-20 (×6): 300 mg via ORAL
  Filled 2011-12-15 (×6): qty 1

## 2011-12-15 MED ORDER — TOBRAMYCIN-DEXAMETHASONE 0.3-0.1 % OP SUSP
1.0000 [drp] | Freq: Four times a day (QID) | OPHTHALMIC | Status: DC
  Administered 2011-12-15 (×2): 1 [drp] via OPHTHALMIC
  Filled 2011-12-15 (×2): qty 2.5

## 2011-12-15 MED ORDER — ONDANSETRON HCL 4 MG PO TABS: 4.0000 mg | ORAL_TABLET | Freq: Four times a day (QID) | ORAL | Status: DC | PRN

## 2011-12-15 MED ORDER — ISOSORBIDE MONONITRATE ER 60 MG PO TB24
60.0000 mg | ORAL_TABLET | Freq: Every day | ORAL | Status: DC
  Administered 2011-12-15 – 2011-12-20 (×6): 60 mg via ORAL
  Filled 2011-12-15 (×6): qty 1

## 2011-12-15 MED ORDER — TOBRAMYCIN-DEXAMETHASONE 0.3-0.05 % OP SUSP
1.0000 [drp] | Freq: Four times a day (QID) | OPHTHALMIC | Status: DC
  Filled 2011-12-15 (×5): qty 5

## 2011-12-15 MED ORDER — LEVOBUNOLOL HCL 0.25 % OP SOLN
1.0000 [drp] | Freq: Every day | OPHTHALMIC | Status: DC
  Filled 2011-12-15 (×5): qty 1

## 2011-12-15 MED ORDER — CIPROFLOXACIN IN D5W 400 MG/200ML IV SOLN
400.0000 mg | Freq: Two times a day (BID) | INTRAVENOUS | Status: DC
  Administered 2011-12-15 – 2011-12-16 (×2): 400 mg via INTRAVENOUS
  Filled 2011-12-15 (×4): qty 200

## 2011-12-15 MED ORDER — CIPROFLOXACIN IN D5W 400 MG/200ML IV SOLN
400.0000 mg | Freq: Once | INTRAVENOUS | Status: AC
  Administered 2011-12-15: 400 mg via INTRAVENOUS
  Filled 2011-12-15: qty 200

## 2011-12-15 MED ORDER — SODIUM CHLORIDE 0.9 % IV SOLN
INTRAVENOUS | Status: AC
  Administered 2011-12-15: 07:00:00 via INTRAVENOUS

## 2011-12-15 MED ORDER — ONDANSETRON HCL 4 MG/2ML IJ SOLN: 4.0000 mg | Freq: Four times a day (QID) | INTRAMUSCULAR | Status: DC | PRN

## 2011-12-15 NOTE — ED Notes (Signed)
MD notified of pt's BP, will give additional 1L NS

## 2011-12-15 NOTE — Progress Notes (Signed)
   CARE MANAGEMENT NOTE 12/15/2011  Patient:  [name], Chambers   Account Number:  [id number]  Date Initiated:  12/15/2011  Documentation initiated by:  GRAVES-BIGELOW,Genae Strine  Subjective/Objective Assessment:   Pt admitted with low bp, syncope and colitis. IV ABX therapy. Pt is from home alone.     Action/Plan:   CM will continue to monitor for disposition needs.   Anticipated DC Date:  12/18/2011   Anticipated DC Plan:  HOME W HOME HEALTH SERVICES      DC Planning Services  CM consult      Choice offered to / List presented to:             Status of service:  In process, will continue to follow Medicare Important Message given?   (If response is "NO", the following Medicare IM given date fields will be blank) Date Medicare IM given:   Date Additional Medicare IM given:    Discharge Disposition:    Per UR Regulation:  Reviewed for med. necessity/level of care/duration of stay  If discussed at Long Length of Stay Meetings, dates discussed:    Comments:

## 2011-12-15 NOTE — H&P (Addendum)
PCP:   Judie Petit, MD   Chief Complaint:  Diarrhea and difficult urination  HPI: 78 year old male who lives alone came to the hospital after patient experienced one day of difficulty in urinating. Patient says that he had increased frequency of urination but only had few drops of urine every time he tried. Patient denies any history of benign prostate hypertrophy. Last night patient says that he developed diarrhea with abdominal pain and while he was sitting in toilet he had muscle cramp which made him fall. Patient hasn't loss of consciousness. Patient is a good historian. Patient denies any chest pain no shortness of breath. He also had vomiting when patient arrived in the ED. Patient denies any fever.  Allergies:   Allergies  Allergen Reactions  . Nsaids     REACTION: renal failure      Past Medical History  Diagnosis Date  . Coronary artery disease   . Stroke     Past Surgical History  Procedure Date  . Appendectomy   . Coronary artery bypass graft   . Hemorrhoid surgery   . Knee arthroscopy   . Elbow surgery   . Cataract extraction     x2    Prior to Admission medications   Medication Sig Start Date End Date Taking? Authorizing Provider  allopurinol (ZYLOPRIM) 300 MG tablet Take 300 mg by mouth daily.   Yes Historical Provider, MD  carvedilol (COREG) 25 MG tablet Take 25 mg by mouth 2 (two) times daily with a meal.   Yes Historical Provider, MD  ezetimibe-simvastatin (VYTORIN) 10-40 MG per tablet Take 1 tablet by mouth at bedtime.   Yes Historical Provider, MD  fluticasone (FLONASE) 50 MCG/ACT nasal spray Place 2 sprays into the nose daily.   Yes Historical Provider, MD  furosemide (LASIX) 40 MG tablet Take 40 mg by mouth daily.   Yes Historical Provider, MD  gabapentin (NEURONTIN) 300 MG capsule Take 300-600 mg by mouth 2 (two) times daily. He takes one capsule in the morning and two capsules at bedtime.   Yes Historical Provider, MD  isosorbide dinitrate  (ISORDIL) 20 MG tablet Take 20 mg by mouth daily.   Yes Historical Provider, MD  levobunolol (BETAGAN) 0.25 % ophthalmic solution Place 1 drop into both eyes at bedtime.     Yes Historical Provider, MD  lisinopril (PRINIVIL,ZESTRIL) 5 MG tablet Take 5 mg by mouth daily.   Yes Historical Provider, MD  LORazepam (ATIVAN) 0.5 MG tablet Take 0.5 tablets (0.25 mg total) by mouth at bedtime. 07/20/11  Yes Lindley Magnus, MD  nitroGLYCERIN (NITROSTAT) 0.4 MG SL tablet Place 0.4 mg under the tongue every 5 (five) minutes as needed. For chest pain.   Yes Historical Provider, MD  predniSONE (DELTASONE) 5 MG tablet Take 2.5 mg by mouth every Monday, Wednesday, and Friday.  11/30/10  Yes Bruce Rexene Edison Swords, MD  Tobramycin-Dexamethasone 0.3-0.05 % SUSP Apply 4 drops to eye daily.   Yes Historical Provider, MD    Social History:  reports that he quit smoking about 23 years ago. He has never used smokeless tobacco. He reports that he does not drink alcohol or use illicit drugs.  Family History  Problem Relation Age of Onset  . Stroke Father     Review of Systems:  HEENT: Denies headache, blurred vision, runny nose, sore throat,  Neck: Denies thyroid problems,lymphadenopathy Chest : Denies shortness of breath, no history of COPD Heart : Denies Chest pain,  coronary arterey disease GI: Denies  nausea,  vomiting, diarrhea, constipation GU: Denies dysuria, urgency, frequency of urination, hematuria Neuro: History of TIA in the past Psych: Denies depression, anxiety, hallucinations   Physical Exam: Blood pressure 107/44, pulse 54, temperature 98.4 F (36.9 C), temperature source Oral, resp. rate 13, SpO2 100.00%. Constitutional:   Patient is a well-developed and well-nourished male in no acute distress and cooperative with exam. Head: Normocephalic and atraumatic Mouth: Mucus membranes moist Eyes: PERRL, EOMI, conjunctivae normal Neck: Supple, No Thyromegaly Cardiovascular: RRR, S1 normal, S2  normal Pulmonary/Chest: CTAB, no wheezes, rales, or rhonchi Abdominal: Soft. Positive tenderness in the left lower quadrant, non-distended, bowel sounds are normal, no masses, organomegaly, or guarding present.  Neurological: A&O x3, Strenght is normal and symmetric bilaterally, cranial nerve II-XII are grossly intact, no focal motor deficit, sensory intact to light touch bilaterally.  Extremities : No Cyanosis, Clubbing or Edema   Labs on Admission:  Results for orders placed during the hospital encounter of 12/14/11 (from the past 48 hour(s))  CBC WITH DIFFERENTIAL     Status: Abnormal   Collection Time   12/15/11 12:00 AM      Component Value Range Comment   WBC 11.4 (*) 4.0 - 10.5 K/uL    RBC 3.96 (*) 4.22 - 5.81 MIL/uL    Hemoglobin 12.9 (*) 13.0 - 17.0 g/dL    HCT 16.1 (*) 09.6 - 52.0 %    MCV 97.5  78.0 - 100.0 fL    MCH 32.6  26.0 - 34.0 pg    MCHC 33.4  30.0 - 36.0 g/dL    RDW 04.5  40.9 - 81.1 %    Platelets 217  150 - 400 K/uL    Neutrophils Relative 75  43 - 77 %    Neutro Abs 8.5 (*) 1.7 - 7.7 K/uL    Lymphocytes Relative 15  12 - 46 %    Lymphs Abs 1.7  0.7 - 4.0 K/uL    Monocytes Relative 5  3 - 12 %    Monocytes Absolute 0.6  0.1 - 1.0 K/uL    Eosinophils Relative 5  0 - 5 %    Eosinophils Absolute 0.5  0.0 - 0.7 K/uL    Basophils Relative 1  0 - 1 %    Basophils Absolute 0.1  0.0 - 0.1 K/uL   COMPREHENSIVE METABOLIC PANEL     Status: Abnormal   Collection Time   12/15/11 12:00 AM      Component Value Range Comment   Sodium 138  135 - 145 mEq/L    Potassium 5.8 (*) 3.5 - 5.1 mEq/L    Chloride 103  96 - 112 mEq/L    CO2 23  19 - 32 mEq/L    Glucose, Bld 107 (*) 70 - 99 mg/dL    BUN 39 (*) 6 - 23 mg/dL    Creatinine, Ser 9.14 (*) 0.50 - 1.35 mg/dL    Calcium 9.9  8.4 - 78.2 mg/dL    Total Protein 7.2  6.0 - 8.3 g/dL    Albumin 4.0  3.5 - 5.2 g/dL    AST 23  0 - 37 U/L    ALT 13  0 - 53 U/L    Alkaline Phosphatase 106  39 - 117 U/L    Total Bilirubin 0.9   0.3 - 1.2 mg/dL    GFR calc non Af Amer 23 (*) >90 mL/min    GFR calc Af Amer 27 (*) >90 mL/min   LACTIC ACID, PLASMA  Status: Normal   Collection Time   12/15/11 12:00 AM      Component Value Range Comment   Lactic Acid, Venous 1.8  0.5 - 2.2 mmol/L   LIPASE, BLOOD     Status: Normal   Collection Time   12/15/11 12:00 AM      Component Value Range Comment   Lipase 32  11 - 59 U/L   TROPONIN I     Status: Normal   Collection Time   12/15/11 12:00 AM      Component Value Range Comment   Troponin I <0.30  <0.30 ng/mL   URINALYSIS, ROUTINE W REFLEX MICROSCOPIC     Status: Abnormal   Collection Time   12/15/11 12:50 AM      Component Value Range Comment   Color, Urine YELLOW  YELLOW    APPearance CLOUDY (*) CLEAR    Specific Gravity, Urine 1.015  1.005 - 1.030    pH 5.5  5.0 - 8.0    Glucose, UA NEGATIVE  NEGATIVE mg/dL    Hgb urine dipstick MODERATE (*) NEGATIVE    Bilirubin Urine NEGATIVE  NEGATIVE    Ketones, ur NEGATIVE  NEGATIVE mg/dL    Protein, ur NEGATIVE  NEGATIVE mg/dL    Urobilinogen, UA 0.2  0.0 - 1.0 mg/dL    Nitrite NEGATIVE  NEGATIVE    Leukocytes, UA SMALL (*) NEGATIVE   URINE MICROSCOPIC-ADD ON     Status: Normal   Collection Time   12/15/11 12:50 AM      Component Value Range Comment   Squamous Epithelial / LPF RARE  RARE    WBC, UA 3-6  <3 WBC/hpf    RBC / HPF 7-10  <3 RBC/hpf    Bacteria, UA RARE  RARE   OCCULT BLOOD, POC DEVICE     Status: Normal   Collection Time   12/15/11 12:53 AM      Component Value Range Comment   Fecal Occult Bld POSITIVE       Radiological Exams on Admission: Ct Abdomen Pelvis Wo Contrast  12/15/2011  *RADIOLOGY REPORT*  Clinical Data: Nausea, vomiting and diarrhea; syncope.  Elevated creatinine.  CT ABDOMEN AND PELVIS WITHOUT CONTRAST  Technique:  Multidetector CT imaging of the abdomen and pelvis was performed following the standard protocol without intravenous contrast.  Comparison: CT of the abdomen and pelvis performed  05/09/2011  Findings: Mild patchy opacities at the lung bases likely reflect atelectasis, though mild pneumonia cannot be excluded.  Underlying emphysematous change is again seen.  Scattered coronary artery calcifications are seen.  Calcification is noted along the aortic valve.  The patient is status post median sternotomy.  Small calcified granulomata are noted within the liver.  The spleen is within normal limits.  Foci of increased attenuation within the gallbladder likely reflect small stones layering dependently; the gallbladder is otherwise unremarkable in appearance.  The pancreas and adrenal glands are unremarkable in appearance.  Diffuse perinephric stranding and fluid is noted bilaterally.  A 2.6 cm cyst is noted at the interpole region of the right kidney. The kidneys are otherwise unremarkable in appearance.  There is no evidence of hydronephrosis.  No renal or ureteral stones are seen.  The small bowel is unremarkable in appearance.  The stomach is filled with contrast and is within normal limits.  No acute vascular abnormalities are seen.  Diffuse calcification is noted along the abdominal aorta and its branches, particularly along the proximal right renal artery.  There is mild  ectasia of the mid to distal abdominal aorta.  The patient is status post appendectomy.  There is diffuse soft tissue inflammation and wall thickening noted along the descending and sigmoid colon, compatible with colitis.  Scattered associated inflamed diverticula are noted along the sigmoid colon. Trace associated free fluid is seen.  The bladder is decompressed, with a Foley catheter in place. Apparent bladder wall thickening may reflect decompression.  The prostate is enlarged, measuring 5.6 cm in transverse dimension.  No inguinal lymphadenopathy is seen.  No acute osseous abnormalities are identified. There is multilevel disc space narrowing along the lumbar spine, with grade 1 retrolisthesis of L1 on L2, grade 1  retrolisthesis of L2 on L3, and grade 1 anterolisthesis of L4 on L5.  Vacuum phenomenon is noted at multiple levels along the lumbar spine.  IMPRESSION:  1.  Diffuse soft tissue inflammation and wall thickening along the descending and sigmoid colon, compatible with acute colitis. Scattered associated inflamed diverticula noted along the sigmoid colon.  Trace associated free fluid seen.  No evidence of perforation or abscess formation. 2.  Mild patchy opacities at the lung bases likely reflect atelectasis, though mild pneumonia cannot be excluded.  Underlying emphysematous change again noted. 3.  Scattered coronary artery calcifications seen. 4.  Degenerative change noted along the lumbar spine. 5.  Diffuse calcification along the abdominal aorta and its branches, particularly along the right renal artery. 6.  Enlarged prostate. 7.  Right renal cyst; bilateral perinephric stranding and fluid are similar in appearance to the prior study, though perhaps slightly more pronounced.   Original Report Authenticated By: Tonia Ghent, M.D.    Dg Chest 2 View  12/15/2011  *RADIOLOGY REPORT*  Clinical Data: Status post fall; concern for chest injury.  History of left-sided chest shrapnel.  CHEST - 2 VIEW  Comparison: Chest radiograph performed 05/09/2011  Findings: The lungs are well-aerated.  Mild bibasilar atelectasis is noted.  There is no evidence of pleural effusion or pneumothorax.  The heart is normal in size; the patient is status post median sternotomy, with evidence of prior CABG.  A vascular stent is noted overlying the superior mediastinum.  No acute osseous abnormalities are seen.  There is question of minimal anterior wedging at the lower thoracic spine; this is less well characterized on the prior study, though likely chronic in nature.  Degenerative change is noted at both glenohumeral joints, with associated sclerosis.  A few tiny metallic densities are noted on both sides of the chest.  IMPRESSION: Mild  bibasilar atelectasis noted; lungs otherwise clear.  No displaced rib fractures seen.   Original Report Authenticated By: Tonia Ghent, M.D.     Assessment/Plan  Acute colitis BPH  Urinary retention Hyperkalemia Coronary disease Hypertension Gout Acute on chronic kidney disease  Acute colitis Patient's CT scan abdomen shows diffuse soft tissue inflammation and wall thickening along the descending and sigmoid colon. Patient will be started on IV Cipro and Flagyl.  Urinary retention Patient's CT abdomen shows enlarged prostate. A Foley catheter was placed in the ED for the urinary retention. Patient will need a urology consultation.  Acute renal failure Patient's creatinine is 2.38, and his last creatinine in October was 1.7. Will obtain renal ultrasound as this is most likely post obstructive renal failure. Will also hold allopurinol, lisinopril at this time.  Hyperkalemia Most likely secondary to acute on chronic kidney disease  Potassium is 5.8, patient has been started on IV normal saline. Will hold the ACE inhibitor. Recheck CMP in  the morning  Coronary artery disease Will continue Isordil. Will hold Coreg at this time due to hypotension patient says he does not take aspirin  Hypertension Blood pressure was low at the time of presentation in the ED. We'll hold the Coreg lisinopril Lasix at this time.  Guaiac positive stool Most likely due to colitis. Hemoglobin hematocrit is stable at this time  Gout Will hold the patient's allopurinol at this time. Patient takes prednisone every other day for gout. We'll continue by mouth prednisone at this time   DVT prophylaxis SCDs  CODE STATUS Patient is DO NOT RESUSCITATE  Time Spent on Admission: 75 min  Ashiah Karpowicz S [facility] Pager: [phone] 12/15/2011, 7:00 AM

## 2011-12-15 NOTE — ED Notes (Signed)
Patient transported to X-ray 

## 2011-12-15 NOTE — ED Notes (Signed)
Pt's daughter Emeline General [phone] at work for questions

## 2011-12-15 NOTE — Progress Notes (Signed)
Pt seen and examined, admitted this am per dr.Lama 1. L sided colitis: likely ischemic, BP was also low on admission Continue Clears, IV cipro/flagyl, also check cdiff PCR 2. Acute on chronic urinary retention: now with foley, CT/USG without hydronephrosis DC foley in am, start Flomax, apparently has seen somebody with [facility], will decide regarding inpatient vs outpatient referral depending on voiding trial tomorrow  3. Patient's creatinine is 2.38, and his last creatinine in October was 1.7, Renal ultrasound without hydronephrosis, continue IVF, hold lasix and lisinopril. 4. Hyperkalemia: due to ARF and ACE, hydrate, hold lisinopril, repeat bmet at 2pm today 5. Guaiac positive stool: Most likely due to colitis, Hemoglobin hematocrit is stable at this time, monitor clinically for overt bleeding  Zannie Cove, MD [phone]

## 2011-12-15 NOTE — Progress Notes (Signed)
UR Completed Shree Espey Graves-Bigelow, RN,BSN [phone]  

## 2011-12-16 DIAGNOSIS — R339 Retention of urine, unspecified: Secondary | ICD-10-CM | POA: Diagnosis present

## 2011-12-16 DIAGNOSIS — D649 Anemia, unspecified: Secondary | ICD-10-CM

## 2011-12-16 DIAGNOSIS — K529 Noninfective gastroenteritis and colitis, unspecified: Secondary | ICD-10-CM

## 2011-12-16 HISTORY — DX: Noninfective gastroenteritis and colitis, unspecified: K52.9

## 2011-12-16 LAB — CBC
HCT: 32.6 % — ABNORMAL LOW (ref 39.0–52.0)
Hemoglobin: 10.7 g/dL — ABNORMAL LOW (ref 13.0–17.0)
Hemoglobin: 10.7 g/dL — ABNORMAL LOW (ref 13.0–17.0)
MCH: 32.4 pg (ref 26.0–34.0)
MCHC: 33.5 g/dL (ref 30.0–36.0)
MCV: 96.7 fL (ref 78.0–100.0)
Platelets: 151 10*3/uL (ref 150–400)
RBC: 3.3 MIL/uL — ABNORMAL LOW (ref 4.22–5.81)
RBC: 3.39 MIL/uL — ABNORMAL LOW (ref 4.22–5.81)
RDW: 13.8 % (ref 11.5–15.5)
WBC: 9.3 10*3/uL (ref 4.0–10.5)

## 2011-12-16 LAB — COMPREHENSIVE METABOLIC PANEL
ALT: 9 U/L (ref 0–53)
AST: 19 U/L (ref 0–37)
Albumin: 2.7 g/dL — ABNORMAL LOW (ref 3.5–5.2)
CO2: 20 mEq/L (ref 19–32)
Chloride: 106 mEq/L (ref 96–112)
Creatinine, Ser: 2.13 mg/dL — ABNORMAL HIGH (ref 0.50–1.35)
GFR calc non Af Amer: 27 mL/min — ABNORMAL LOW (ref >90)
Sodium: 136 mEq/L (ref 135–145)
Total Bilirubin: 0.6 mg/dL (ref 0.3–1.2)

## 2011-12-16 LAB — URINE CULTURE

## 2011-12-16 LAB — CLOSTRIDIUM DIFFICILE BY PCR: Toxigenic C. Difficile by PCR: NEGATIVE

## 2011-12-16 LAB — ABO/RH: ABO/RH(D): A POS

## 2011-12-16 LAB — TYPE AND SCREEN

## 2011-12-16 MED ORDER — CIPROFLOXACIN IN D5W 400 MG/200ML IV SOLN
400.0000 mg | INTRAVENOUS | Status: DC
  Administered 2011-12-17: 400 mg via INTRAVENOUS
  Filled 2011-12-16: qty 200

## 2011-12-16 MED ORDER — CYCLOSPORINE 0.05 % OP EMUL
1.0000 [drp] | Freq: Four times a day (QID) | OPHTHALMIC | Status: DC
  Administered 2011-12-16 – 2011-12-20 (×17): 1 [drp] via OPHTHALMIC
  Filled 2011-12-16 (×21): qty 1

## 2011-12-16 MED ORDER — TAMSULOSIN HCL 0.4 MG PO CAPS
0.4000 mg | ORAL_CAPSULE | Freq: Every day | ORAL | Status: DC
  Administered 2011-12-16 – 2011-12-20 (×5): 0.4 mg via ORAL
  Filled 2011-12-16 (×5): qty 1

## 2011-12-16 NOTE — Progress Notes (Signed)
[facility]             Progress Note   Subjective: Denies any complaints, foley was removed earlier this am, BMs overnight with streaks of blood  Objective: Vital signs in last 24 hours: Temp:  [98.2 F (36.8 C)-99.5 F (37.5 C)] 98.2 F (36.8 C) (12/05 0646) Pulse Rate:  [56-69] 56  (12/05 0606) Resp:  [18-20] 18  (12/05 0606) BP: (102-123)/(46-78) 110/78 mmHg (12/05 0606) SpO2:  [97 %-100 %] 100 % (12/05 0606) Weight:  [70.8 kg (156 lb 1.4 oz)] 70.8 kg (156 lb 1.4 oz) (12/05 0606) Weight change:  Last BM Date: 12/16/11  Intake/Output from previous day: 12/04 0701 - 12/05 0700 In: 820 [P.O.:120; IV Piggyback:700] Out: 1250 [Urine:1250] Total I/O In: -  Out: 50 [Urine:50]   Physical Exam: General: Alert, awake, oriented x3, in no acute distress. HEENT: No bruits, no goiter. Heart: Regular rate and rhythm, without murmurs, rubs, gallops. Lungs: Clear to auscultation bilaterally. Abdomen: Soft, nontender, nondistended, positive bowel sounds. Extremities: No clubbing cyanosis or edema with positive pedal pulses. Neuro: Grossly intact, nonfocal.    Lab Results: Basic Metabolic Panel:  Basename 12/16/11 0520 12/15/11 1425  NA 136 136  K 4.4 5.0  CL 106 106  CO2 20 20  GLUCOSE 91 106*  BUN 35* 38*  CREATININE 2.13* 2.32*  CALCIUM 8.0* 8.2*  MG -- --  PHOS -- --   Liver Function Tests:  Basename 12/16/11 0520 12/15/11  AST 19 23  ALT 9 13  ALKPHOS 73 106  BILITOT 0.6 0.9  PROT 5.2* 7.2  ALBUMIN 2.7* 4.0    Basename 12/15/11  LIPASE 32  AMYLASE --   No results found for this basename: AMMONIA:2 in the last 72 hours CBC:  Basename 12/16/11 0520 12/15/11  WBC 10.2 11.4*  NEUTROABS -- 8.5*  HGB 10.7* 12.9*  HCT 31.9* 38.6*  MCV 96.7 97.5  PLT 151 217   Cardiac Enzymes:  Basename 12/15/11  CKTOTAL --  CKMB --  CKMBINDEX --  TROPONINI <0.30   BNP: No results found for this basename: PROBNP:3 in the last 72  hours D-Dimer: No results found for this basename: DDIMER:2 in the last 72 hours CBG: No results found for this basename: GLUCAP:6 in the last 72 hours Hemoglobin A1C: No results found for this basename: HGBA1C in the last 72 hours Fasting Lipid Panel: No results found for this basename: CHOL,HDL,LDLCALC,TRIG,CHOLHDL,LDLDIRECT in the last 72 hours Thyroid Function Tests: No results found for this basename: TSH,T4TOTAL,FREET4,T3FREE,THYROIDAB in the last 72 hours Anemia Panel: No results found for this basename: VITAMINB12,FOLATE,FERRITIN,TIBC,IRON,RETICCTPCT in the last 72 hours Coagulation: No results found for this basename: LABPROT:2,INR:2 in the last 72 hours Urine Drug Screen: Drugs of Abuse  No results found for this basename: labopia, cocainscrnur, labbenz, amphetmu, thcu, labbarb    Alcohol Level: No results found for this basename: ETH:2 in the last 72 hours Urinalysis:  Basename 12/15/11 0050  COLORURINE YELLOW  LABSPEC 1.015  PHURINE 5.5  GLUCOSEU NEGATIVE  HGBUR MODERATE*  BILIRUBINUR NEGATIVE  KETONESUR NEGATIVE  PROTEINUR NEGATIVE  UROBILINOGEN 0.2  NITRITE NEGATIVE  LEUKOCYTESUR SMALL*    Recent Results (from the past 240 hour(s))  URINE CULTURE     Status: Normal   Collection Time   12/15/11 12:50 AM      Component Value Range Status Comment   Specimen Description URINE, CATHETERIZED   Final    Special Requests NONE   Final    Culture  Setup  Time 12/15/2011 08:38   Final    Colony Count NO GROWTH   Final    Culture NO GROWTH   Final    Report Status 12/16/2011 FINAL   Final   CLOSTRIDIUM DIFFICILE BY PCR     Status: Normal   Collection Time   12/15/11  6:30 PM      Component Value Range Status Comment   C difficile by pcr NEGATIVE  NEGATIVE Final     Studies/Results: Ct Abdomen Pelvis Wo Contrast  12/15/2011  *RADIOLOGY REPORT*  Clinical Data: Nausea, vomiting and diarrhea; syncope.  Elevated creatinine.  CT ABDOMEN AND PELVIS WITHOUT CONTRAST   Technique:  Multidetector CT imaging of the abdomen and pelvis was performed following the standard protocol without intravenous contrast.  Comparison: CT of the abdomen and pelvis performed 05/09/2011  Findings: Mild patchy opacities at the lung bases likely reflect atelectasis, though mild pneumonia cannot be excluded.  Underlying emphysematous change is again seen.  Scattered coronary artery calcifications are seen.  Calcification is noted along the aortic valve.  The patient is status post median sternotomy.  Small calcified granulomata are noted within the liver.  The spleen is within normal limits.  Foci of increased attenuation within the gallbladder likely reflect small stones layering dependently; the gallbladder is otherwise unremarkable in appearance.  The pancreas and adrenal glands are unremarkable in appearance.  Diffuse perinephric stranding and fluid is noted bilaterally.  A 2.6 cm cyst is noted at the interpole region of the right kidney. The kidneys are otherwise unremarkable in appearance.  There is no evidence of hydronephrosis.  No renal or ureteral stones are seen.  The small bowel is unremarkable in appearance.  The stomach is filled with contrast and is within normal limits.  No acute vascular abnormalities are seen.  Diffuse calcification is noted along the abdominal aorta and its branches, particularly along the proximal right renal artery.  There is mild ectasia of the mid to distal abdominal aorta.  The patient is status post appendectomy.  There is diffuse soft tissue inflammation and wall thickening noted along the descending and sigmoid colon, compatible with colitis.  Scattered associated inflamed diverticula are noted along the sigmoid colon. Trace associated free fluid is seen.  The bladder is decompressed, with a Foley catheter in place. Apparent bladder wall thickening may reflect decompression.  The prostate is enlarged, measuring 5.6 cm in transverse dimension.  No inguinal  lymphadenopathy is seen.  No acute osseous abnormalities are identified. There is multilevel disc space narrowing along the lumbar spine, with grade 1 retrolisthesis of L1 on L2, grade 1 retrolisthesis of L2 on L3, and grade 1 anterolisthesis of L4 on L5.  Vacuum phenomenon is noted at multiple levels along the lumbar spine.  IMPRESSION:  1.  Diffuse soft tissue inflammation and wall thickening along the descending and sigmoid colon, compatible with acute colitis. Scattered associated inflamed diverticula noted along the sigmoid colon.  Trace associated free fluid seen.  No evidence of perforation or abscess formation. 2.  Mild patchy opacities at the lung bases likely reflect atelectasis, though mild pneumonia cannot be excluded.  Underlying emphysematous change again noted. 3.  Scattered coronary artery calcifications seen. 4.  Degenerative change noted along the lumbar spine. 5.  Diffuse calcification along the abdominal aorta and its branches, particularly along the right renal artery. 6.  Enlarged prostate. 7.  Right renal cyst; bilateral perinephric stranding and fluid are similar in appearance to the prior study, though perhaps slightly more  pronounced.   Original Report Authenticated By: Tonia Ghent, M.D.    Dg Chest 2 View  12/15/2011  *RADIOLOGY REPORT*  Clinical Data: Status post fall; concern for chest injury.  History of left-sided chest shrapnel.  CHEST - 2 VIEW  Comparison: Chest radiograph performed 05/09/2011  Findings: The lungs are well-aerated.  Mild bibasilar atelectasis is noted.  There is no evidence of pleural effusion or pneumothorax.  The heart is normal in size; the patient is status post median sternotomy, with evidence of prior CABG.  A vascular stent is noted overlying the superior mediastinum.  No acute osseous abnormalities are seen.  There is question of minimal anterior wedging at the lower thoracic spine; this is less well characterized on the prior study, though likely  chronic in nature.  Degenerative change is noted at both glenohumeral joints, with associated sclerosis.  A few tiny metallic densities are noted on both sides of the chest.  IMPRESSION: Mild bibasilar atelectasis noted; lungs otherwise clear.  No displaced rib fractures seen.   Original Report Authenticated By: Tonia Ghent, M.D.    US Renal  12/15/2011  *RADIOLOGY REPORT*  Clinical Data: Urinary retention  RENAL/URINARY TRACT ULTRASOUND COMPLETE  Comparison:  CT abdomen pelvis 12/15/2011  Findings:  Right Kidney:  Measures 9.9 cm in length.  There is mild diffuse cortical thinning.  The renal cortex is increased in echogenicity. There is a 1.6 x 1.5 x 1.5 cm simple parapelvic cyst.  Negative for hydronephrosis.  Left Kidney:  Measures 9.7 cm in length.  Mild diffuse thinning of the renal cortex, and increased echogenicity of the renal cortex. There is no hydronephrosis.  Bladder:  The urinary bladder is not very well visualized.  A Foley catheter is in place.  No evidence of urinary bladder distention.  IMPRESSION:  1.  Slight cortical atrophy and increased echogenicity of the kidneys.  Findings suggest chronic medical renal disease. 2.  Negative for hydronephrosis. 3. Simple 1.6 cm parapelvic cyst right kidney.   Original Report Authenticated By: Britta Mccreedy, M.D.     Medications: Scheduled Meds:   . [COMPLETED] sodium chloride   Intravenous STAT  . ciprofloxacin  400 mg Intravenous Q24H  . cycloSPORINE  1 drop Both Eyes QID  . gabapentin  300 mg Oral Daily  . gabapentin  600 mg Oral QHS  . isosorbide mononitrate  60 mg Oral Daily  . LORazepam  0.25 mg Oral QHS  . metronidazole  500 mg Intravenous Q8H  . predniSONE  2.5 mg Oral Q M,W,F  . Tamsulosin HCl  0.4 mg Oral Daily  . [DISCONTINUED] ciprofloxacin  400 mg Intravenous Q12H  . [DISCONTINUED] levobunolol  1 drop Both Eyes QHS  . [DISCONTINUED] Tamsulosin HCl  0.4 mg Oral Daily  . [DISCONTINUED] tobramycin-dexamethasone  1 drop Both Eyes  QID  . [DISCONTINUED] Tobramycin-Dexamethasone  1 drop Ophthalmic QID   Continuous Infusions:   . sodium chloride 75 mL/hr at 12/16/11 0209   PRN Meds:.ondansetron (ZOFRAN) IV, ondansetron  Assessment/Plan: 1. L sided colitis: likely ischemic, BP was also low on admission  Continue Clears, IV cipro/flagyl,  cdiff PCR negative With some streaks of blood overnight, 2 gm drop in Hb, but dilution could be contributing too, monitor CBC Q12, type and screen If recurs then will request GI consult, otherwise outpatient GI eval.  2. Acute on chronic urinary retention: Foley Dced at 6am then with blood clots per urethra and urinary retention, I called and discussed with Dr.Nesi, Urology who recommended replacing  the foley catheter and having him FU with Dr.Ottelin with the Foley for further workup, CT/USG without hydronephrosis  Continue flomax  3. Patient's creatinine is 2.38, and his last creatinine in October was 1.7, Renal ultrasound without hydronephrosis, continue IVF, hold lasix and lisinopril, creatinine with mild improvement.  4. Hyperkalemia: due to ARF and ACE, hydrate, hold lisinopril, resolved    Time spent coordinating care:   LOS: 2 days   [facility] Pager: [phone] 12/16/2011, 1:31 PM

## 2011-12-16 NOTE — Progress Notes (Signed)
Foley catheter removed per MD order. No trauma noted pt tolerated well. Will continue to monitor for output to end of shift and notify oncoming nurse.

## 2011-12-16 NOTE — Progress Notes (Signed)
Patient beginning to have more frequent liquidy-like stools, has had about 4 loose stools from light brown with minimal streaks of blood and now stool is light brown with more streaks of blood. Pt placed on contact Isolation to rule out C.Diff. Notified MD via text page. Previous Hgb 12.9, results are pending for morning labs. Pt is resting and complains of no signs and symptoms of discomfort. VSS with low-grade temp of 99.5 but patient had on 5-6 blankets. Removed blankets and will recheck in . Will continue to monitor patient to end of shift.

## 2011-12-16 NOTE — Clinical Documentation Improvement (Signed)
SHOCK DOCUMENTATION CLARIFICATION QUERY  THIS DOCUMENT IS NOT A PERMANENT PART OF THE MEDICAL RECORD  Please update your documentation within the medical record to reflect your response to this query.                                                                                         12/16/11   Dr. Jomarie Longs and/or Associates,  In a better effort to capture your patient's severity of illness, reflect appropriate length of stay and utilization of resources, a review of the patient medical record has revealed the following indicators:  BP and HR in ED   93/47      61     @ 2339 104/48      63     @ 0005   89/37      50     @ 0323   84/26      52     @ 0330   82/40      52     @ 0345 105/39      55     @ 0447 105/63      64     @ 0449 107/44      54     @ 0500 106/39      55     @ 0600 106/61      57     @ 0700  Normal Saline Boluses x 3 given in ED  Foley Catheter inserted for urinary retention with of dark urine returned.  Additional Diagnoses included: Acute Renal Failure  Chief Complaints also included Nausea, Vomiting and Diarrhea  Medications prior to Admission included: Coreg 25 mg bid Lasix 40 mg daily Isordil 20 mg daily Prinivil 5 mg daily  "L sided colitis: likely ischemic, BP was also low on admission" Zannie Cove, MD  12/15/11 Progress Note   Based on your clinical judgment, please document in the progress notes and discharge summary if a condition below provides greater specificity regarding the patient's hemodynamic status on admission:   - Hypovolemic Shock improved with IV fluids   - Other Condition   - Unable to Clinically Determine    In responding to this query please exercise your independent judgment.    The fact that a query is asked, does not imply that any particular answer is desired or expected.    Reviewed: 12/20/11 - "transient low BP" documented as response to query for shock by Dr. Jomarie Longs.  Mathis Dad  RN  Thank You,  Jerral Ralph  RN BSN CCDS Certified Clinical Documentation Specialist: Cell   [phone]  Health Information Management [place]  TO RESPOND TO THE THIS QUERY, FOLLOW THE INSTRUCTIONS BELOW:  1. If needed, update documentation for the patient's encounter via the notes activity.  2. Access this query again and click edit on the In [employer].  3. After updating, or not, click F2 to complete all highlighted (required) fields concerning your review. Select "additional documentation in the medical record" OR "no additional documentation provided".  4. Click Sign note button.  5. The deficiency will fall  out of your In Basket *Please let us know if you are not able to complete this workflow by phone or e-mail (listed below).

## 2011-12-17 LAB — BASIC METABOLIC PANEL
CO2: 21 mEq/L (ref 19–32)
Chloride: 107 mEq/L (ref 96–112)
Creatinine, Ser: 1.6 mg/dL — ABNORMAL HIGH (ref 0.50–1.35)

## 2011-12-17 LAB — CBC
HCT: 29.2 % — ABNORMAL LOW (ref 39.0–52.0)
HCT: 32.7 % — ABNORMAL LOW (ref 39.0–52.0)
Hemoglobin: 9.8 g/dL — ABNORMAL LOW (ref 13.0–17.0)
MCH: 31.9 pg (ref 26.0–34.0)
MCV: 95.1 fL (ref 78.0–100.0)
MCV: 96.7 fL (ref 78.0–100.0)
RBC: 3.07 MIL/uL — ABNORMAL LOW (ref 4.22–5.81)
RBC: 3.38 MIL/uL — ABNORMAL LOW (ref 4.22–5.81)
RDW: 13.8 % (ref 11.5–15.5)
WBC: 9.6 10*3/uL (ref 4.0–10.5)

## 2011-12-17 MED ORDER — METRONIDAZOLE 250 MG PO TABS
250.0000 mg | ORAL_TABLET | Freq: Three times a day (TID) | ORAL | Status: DC
  Administered 2011-12-17 – 2011-12-19 (×8): 250 mg via ORAL
  Filled 2011-12-17 (×12): qty 1

## 2011-12-17 MED ORDER — CIPROFLOXACIN HCL 250 MG PO TABS
250.0000 mg | ORAL_TABLET | Freq: Two times a day (BID) | ORAL | Status: DC
  Administered 2011-12-17 – 2011-12-20 (×6): 250 mg via ORAL
  Filled 2011-12-17 (×8): qty 1

## 2011-12-17 MED ORDER — ZOLPIDEM TARTRATE 5 MG PO TABS
5.0000 mg | ORAL_TABLET | Freq: Every evening | ORAL | Status: DC | PRN
  Administered 2011-12-17 – 2011-12-19 (×4): 5 mg via ORAL
  Filled 2011-12-17 (×4): qty 1

## 2011-12-17 NOTE — Evaluation (Signed)
Seen and agree with SPT note Asuzena Weis Tabor Ramona Slinger, PT [phone]  

## 2011-12-17 NOTE — Progress Notes (Addendum)
[facility]             Progress Note   Subjective: Denies any complaints, foley replaced yesterday, no issues since, denies noticing blood in Bm since day before night  Objective: Vital signs in last 24 hours: Temp:  [97.9 F (36.6 C)-98 F (36.7 C)] 97.9 F (36.6 C) (12/06 0532) Pulse Rate:  [61-79] 79  (12/06 0850) Resp:  [20] 20  (12/06 0532) BP: (90-129)/(35-68) 120/68 mmHg (12/06 0532) SpO2:  [92 %-100 %] 92 % (12/06 0850) Weight:  [70 kg (154 lb 5.2 oz)] 70 kg (154 lb 5.2 oz) (12/06 0532) Weight change: 0.9 kg (1 lb 15.7 oz) Last BM Date: 12/16/11  Intake/Output from previous day: 12/05 0701 - 12/06 0700 In: 16109 [P.O.:9954; I.V.:900; IV Piggyback:100] Out: 1601 [Urine:1600; Stool:1] Total I/O In: 240 [P.O.:240] Out: 250 [Urine:250]   Physical Exam: General: Alert, awake, oriented x3, in no acute distress. HEENT: No bruits, no goiter. Heart: Regular rate and rhythm, without murmurs, rubs, gallops. Lungs: Clear to auscultation bilaterally. Abdomen: Soft, nontender, nondistended, positive bowel sounds. Extremities: No clubbing cyanosis or edema with positive pedal pulses. Neuro: Grossly intact, nonfocal.    Lab Results: Basic Metabolic Panel:  Basename 12/17/11 0200 12/16/11 0520  NA 137 136  K 4.0 4.4  CL 107 106  CO2 21 20  GLUCOSE 113* 91  BUN 28* 35*  CREATININE 1.60* 2.13*  CALCIUM 8.2* 8.0*  MG -- --  PHOS -- --   Liver Function Tests:  Basename 12/16/11 0520 12/15/11  AST 19 23  ALT 9 13  ALKPHOS 73 106  BILITOT 0.6 0.9  PROT 5.2* 7.2  ALBUMIN 2.7* 4.0    Basename 12/15/11  LIPASE 32  AMYLASE --   No results found for this basename: AMMONIA:2 in the last 72 hours CBC:  Basename 12/17/11 0200 12/16/11 1440 12/15/11  WBC 8.7 9.3 --  NEUTROABS -- -- 8.5*  HGB 9.8* 10.7* --  HCT 29.2* 32.6* --  MCV 95.1 96.2 --  PLT 135* 143* --   Cardiac Enzymes:  Basename 12/15/11  CKTOTAL --  CKMB --  CKMBINDEX --   TROPONINI <0.30   BNP: No results found for this basename: PROBNP:3 in the last 72 hours D-Dimer: No results found for this basename: DDIMER:2 in the last 72 hours CBG: No results found for this basename: GLUCAP:6 in the last 72 hours Hemoglobin A1C: No results found for this basename: HGBA1C in the last 72 hours Fasting Lipid Panel: No results found for this basename: CHOL,HDL,LDLCALC,TRIG,CHOLHDL,LDLDIRECT in the last 72 hours Thyroid Function Tests: No results found for this basename: TSH,T4TOTAL,FREET4,T3FREE,THYROIDAB in the last 72 hours Anemia Panel: No results found for this basename: VITAMINB12,FOLATE,FERRITIN,TIBC,IRON,RETICCTPCT in the last 72 hours Coagulation: No results found for this basename: LABPROT:2,INR:2 in the last 72 hours Urine Drug Screen: Drugs of Abuse  No results found for this basename: labopia,  cocainscrnur,  labbenz,  amphetmu,  thcu,  labbarb    Alcohol Level: No results found for this basename: ETH:2 in the last 72 hours Urinalysis:  Basename 12/15/11 0050  COLORURINE YELLOW  LABSPEC 1.015  PHURINE 5.5  GLUCOSEU NEGATIVE  HGBUR MODERATE*  BILIRUBINUR NEGATIVE  KETONESUR NEGATIVE  PROTEINUR NEGATIVE  UROBILINOGEN 0.2  NITRITE NEGATIVE  LEUKOCYTESUR SMALL*    Recent Results (from the past 240 hour(s))  URINE CULTURE     Status: Normal   Collection Time   12/15/11 12:50 AM      Component Value Range Status Comment  Specimen Description URINE, CATHETERIZED   Final    Special Requests NONE   Final    Culture  Setup Time 12/15/2011 08:38   Final    Colony Count NO GROWTH   Final    Culture NO GROWTH   Final    Report Status 12/16/2011 FINAL   Final   CLOSTRIDIUM DIFFICILE BY PCR     Status: Normal   Collection Time   12/15/11  6:30 PM      Component Value Range Status Comment   C difficile by pcr NEGATIVE  NEGATIVE Final     Studies/Results: No results found.  Medications: Scheduled Meds:    . ciprofloxacin  250 mg Oral BID   . cycloSPORINE  1 drop Both Eyes QID  . gabapentin  300 mg Oral Daily  . gabapentin  600 mg Oral QHS  . isosorbide mononitrate  60 mg Oral Daily  . LORazepam  0.25 mg Oral QHS  . metroNIDAZOLE  250 mg Oral Q8H  . predniSONE  2.5 mg Oral Q M,W,F  . Tamsulosin HCl  0.4 mg Oral Daily  . [DISCONTINUED] ciprofloxacin  400 mg Intravenous Q24H  . [DISCONTINUED] metronidazole  500 mg Intravenous Q8H   Continuous Infusions:    . sodium chloride 1,000 mL (12/17/11 0825)   PRN Meds:.ondansetron (ZOFRAN) IV, ondansetron, zolpidem  Assessment/Plan: 1. L sided colitis: likely ischemic, BP was also low on admission  improving Advance diet , change to PO cipro/flagyl,  cdiff PCR negative With some streaks of blood overnight 12/4, 2 gm drop in Hb, but dilution could be contributing too, monitor CBC Q12, type and screen If recurs then will request GI consult, otherwise outpatient GI eval.  2. Acute on chronic urinary retention: Foley Dced at 6am 12/5 then with blood clots per urethra and urinary retention, I called and discussed with Dr.Nesi, Urology who recommended replacing the foley catheter which was done 12/5  and having him FU with Dr.Ottelin with the Foley for further workup, CT/USG without hydronephrosis  Continue flomax  3. Patient's creatinine is 2.38, and his last creatinine in October was 1.7, Renal ultrasound without hydronephrosis, creatinine improved, stop IVF, hold lasix and lisinopril  4. Hyperkalemia: due to ARF and ACE, hydrate, hold lisinopril, resolved   5. Transient hypotension: resolved with IVF in ER  PT/OT ambulate  Time spent coordinating care:   LOS: 3 days   [facility], [place] [facility] Pager: [phone] 12/17/2011, 1:06 PM

## 2011-12-17 NOTE — Progress Notes (Signed)
Pt stated that he was having difficulty going to sleep. Notified MD via text page. PRN orders given and was administered per PRN order. Will continue to monitor to end of shift.

## 2011-12-17 NOTE — Progress Notes (Signed)
Notified via text page to on-call MD that patients' Hgb is now 9.8 which has decreased from 10.7 yesterday. On admission to the floor his hgb was 12.9. Just FYI.

## 2011-12-17 NOTE — Evaluation (Signed)
Physical Therapy Evaluation Patient Details Name: [name] MRN: [id number] DOB: [birth year] Today's Date: 12/17/2011 Time: 1191-4782 PT Time Calculation (min): 27 min  PT Assessment / Plan / Recommendation Clinical Impression  Pt. admitted with acute colitis, urinary retention, and had a fall off toliet; Pt. would benefit from acute PT to address safe mobility and activity tolerance so pt. may return home at [facility]. Pt. has decreased safety awareness as he was running into objects with his RW; had to be cued to move RW. Pt. talkative throughout which was distracting pt. from performing safe mobility. Pt. given RW to use with ambulation today secondaty to pt. being unsteady when moving from sit-> stand.     PT Assessment  Patient needs continued PT services    Follow Up Recommendations  SNF       Barriers to Discharge Decreased caregiver support      Equipment Recommendations  None recommended by PT       Frequency Min 3X/week    Precautions / Restrictions Precautions Precautions: Fall Restrictions Weight Bearing Restrictions: No   Pertinent Vitals/Pain O2 between 92-95% on room air with activity HR 79 No pain reported       Mobility  Bed Mobility Bed Mobility: Rolling Right;Right Sidelying to Sit Rolling Right: 5: Supervision;With rail Right Sidelying to Sit: HOB elevated;4: Min assist;With rails (25 degrees) Details for Bed Mobility Assistance: cueing for sequence to roll to side first and assist to elevate trunk. Pt initially trying to transfer supine to sit and unable to achieve after several attempts Transfers Transfers: Sit to Stand;Stand to Sit Sit to Stand: From bed;4: Min assist Stand to Sit: To chair/3-in-1;4: Min assist Details for Transfer Assistance: Pt. requored cueing for sequencing. Physical assist to control descent to chair when sitting.  Ambulation/Gait Ambulation/Gait Assistance: 4: Min assist Ambulation Distance (Feet): 150 Feet Assistive  device: Rolling walker Ambulation/Gait Assistance Details: Pt. required cueing to stay inside RW and to watch out for objects in hallway; running into objects with RW.  Gait Pattern: Trunk flexed;Decreased stride length;Step-through pattern Gait velocity: decreased           PT Diagnosis: Difficulty walking  PT Problem List: Decreased activity tolerance;Decreased balance;Decreased mobility;Decreased knowledge of use of DME;Decreased safety awareness PT Treatment Interventions: DME instruction;Gait training;Stair training;Functional mobility training;Therapeutic activities;Therapeutic exercise;Balance training;Patient/family education   PT Goals Acute Rehab PT Goals PT Goal Formulation: With patient Time For Goal Achievement: 12/24/11 Potential to Achieve Goals: Good Pt will go Supine/Side to Sit: with modified independence PT Goal: Supine/Side to Sit - Progress: Goal set today Pt will go Sit to Stand: with modified independence PT Goal: Sit to Stand - Progress: Goal set today Pt will go Stand to Sit: with modified independence PT Goal: Stand to Sit - Progress: Goal set today Pt will Ambulate: >150 feet;with modified independence;with least restrictive assistive device PT Goal: Ambulate - Progress: Goal set today Pt will Perform Home Exercise Program: Independently PT Goal: Perform Home Exercise Program - Progress: Goal set today  Visit Information  Last PT Received On: 12/17/11 Assistance Needed: +1    Subjective Data  Subjective: " Patient Stated Goal: return home with my cat   Prior Functioning  Home Living Lives With: Alone Available Help at Discharge: Family;Available PRN/intermittently (dgtr lives a couple miles away and can help at times) Type of Home: Apartment Home Access: Level entry Home Layout: One level Bathroom Shower/Tub: Engineer, manufacturing systems: Standard Home Adaptive Equipment: Grab bars in shower;Straight cane Prior  Function Level of  Independence: Independent Able to Take Stairs?: No Driving: Yes Vocation: Retired Musician: Clinical cytogeneticist  Overall Cognitive Status: Impaired Area of Impairment: Engineer, drilling;Following commands;Awareness of errors Arousal/Alertness: Awake/alert Orientation Level: Appears intact for tasks assessed Behavior During Session: [facility] for tasks performed Following Commands: Follows one step commands with increased time Safety/Judgement: Decreased safety judgement for tasks assessed Safety/Judgement - Other Comments: Pt. running into objects in hallway with RW Awareness of Errors: Assistance required to identify errors made    Extremity/Trunk Assessment Right Upper Extremity Assessment RUE ROM/Strength/Tone: [facility] for tasks assessed Left Upper Extremity Assessment LUE ROM/Strength/Tone: [facility] for tasks assessed Right Lower Extremity Assessment RLE ROM/Strength/Tone: [facility] for tasks assessed Left Lower Extremity Assessment LLE ROM/Strength/Tone: [facility] for tasks assessed Trunk Assessment Trunk Assessment: Normal      End of Session PT - End of Session Equipment Utilized During Treatment: Gait belt Activity Tolerance: Patient tolerated treatment well Patient left: in chair;with call bell/phone within reach Nurse Communication: Mobility status    Army Chaco SPT 12/17/2011, 9:50 AM

## 2011-12-18 LAB — CBC
HCT: 30.1 % — ABNORMAL LOW (ref 39.0–52.0)
Hemoglobin: 10.5 g/dL — ABNORMAL LOW (ref 13.0–17.0)
MCH: 32.7 pg (ref 26.0–34.0)
MCHC: 34.9 g/dL (ref 30.0–36.0)

## 2011-12-18 LAB — BASIC METABOLIC PANEL
BUN: 23 mg/dL (ref 6–23)
Calcium: 8.8 mg/dL (ref 8.4–10.5)
Creatinine, Ser: 1.45 mg/dL — ABNORMAL HIGH (ref 0.50–1.35)
GFR calc non Af Amer: 42 mL/min — ABNORMAL LOW (ref >90)
Glucose, Bld: 100 mg/dL — ABNORMAL HIGH (ref 70–99)

## 2011-12-18 NOTE — Progress Notes (Signed)
SW kelly contacted and instructed charge nurse that they are trying to get in touch with pt's daughter.  Amanda Pea, Charity fundraiser.

## 2011-12-18 NOTE — Progress Notes (Signed)
[facility]             Progress Note   Subjective: Denies any complaints, foley replaced yesterday, no issues since, denies noticing blood in Bm since day before night  Objective: Vital signs in last 24 hours: Temp:  [97.9 F (36.6 C)-98.2 F (36.8 C)] 98.2 F (36.8 C) (12/07 0453) Pulse Rate:  [62-79] 64  (12/07 0453) Resp:  [18] 18  (12/07 0453) BP: (105-112)/(51-57) 109/51 mmHg (12/07 0453) SpO2:  [92 %-97 %] 97 % (12/07 0453) Weight:  [71.4 kg (157 lb 6.5 oz)] 71.4 kg (157 lb 6.5 oz) (12/07 0453) Weight change: 1.4 kg (3 lb 1.4 oz) Last BM Date: 12/17/11  Intake/Output from previous day: 12/06 0701 - 12/07 0700 In: 930 [P.O.:930] Out: 1126 [Urine:1125; Stool:1]     Physical Exam: General: Alert, awake, oriented x3, in no acute distress. HEENT: No bruits, no goiter. Heart: Regular rate and rhythm, without murmurs, rubs, gallops. Lungs: Clear to auscultation bilaterally. Abdomen: Soft, nontender, nondistended, positive bowel sounds. Extremities: No clubbing cyanosis or edema with positive pedal pulses. Neuro: Grossly intact, nonfocal.    Lab Results: Basic Metabolic Panel:  Basename 12/17/11 0200 12/16/11 0520  NA 137 136  K 4.0 4.4  CL 107 106  CO2 21 20  GLUCOSE 113* 91  BUN 28* 35*  CREATININE 1.60* 2.13*  CALCIUM 8.2* 8.0*  MG -- --  PHOS -- --   Liver Function Tests:  Basename 12/16/11 0520  AST 19  ALT 9  ALKPHOS 73  BILITOT 0.6  PROT 5.2*  ALBUMIN 2.7*   No results found for this basename: LIPASE:2,AMYLASE:2 in the last 72 hours No results found for this basename: AMMONIA:2 in the last 72 hours CBC:  Basename 12/18/11 0136 12/17/11 1336  WBC 8.1 9.6  NEUTROABS -- --  HGB 10.5* 11.0*  HCT 30.1* 32.7*  MCV 93.8 96.7  PLT 149* 147*   Cardiac Enzymes: No results found for this basename: CKTOTAL:3,CKMB:3,CKMBINDEX:3,TROPONINI:3 in the last 72 hours BNP: No results found for this basename: PROBNP:3 in the last 72  hours D-Dimer: No results found for this basename: DDIMER:2 in the last 72 hours CBG: No results found for this basename: GLUCAP:6 in the last 72 hours Hemoglobin A1C: No results found for this basename: HGBA1C in the last 72 hours Fasting Lipid Panel: No results found for this basename: CHOL,HDL,LDLCALC,TRIG,CHOLHDL,LDLDIRECT in the last 72 hours Thyroid Function Tests: No results found for this basename: TSH,T4TOTAL,FREET4,T3FREE,THYROIDAB in the last 72 hours Anemia Panel: No results found for this basename: VITAMINB12,FOLATE,FERRITIN,TIBC,IRON,RETICCTPCT in the last 72 hours Coagulation: No results found for this basename: LABPROT:2,INR:2 in the last 72 hours Urine Drug Screen: Drugs of Abuse  No results found for this basename: labopia,  cocainscrnur,  labbenz,  amphetmu,  thcu,  labbarb    Alcohol Level: No results found for this basename: ETH:2 in the last 72 hours Urinalysis: No results found for this basename: COLORURINE:2,APPERANCEUR:2,LABSPEC:2,PHURINE:2,GLUCOSEU:2,HGBUR:2,BILIRUBINUR:2,KETONESUR:2,PROTEINUR:2,UROBILINOGEN:2,NITRITE:2,LEUKOCYTESUR:2 in the last 72 hours  Recent Results (from the past 240 hour(s))  URINE CULTURE     Status: Normal   Collection Time   12/15/11 12:50 AM      Component Value Range Status Comment   Specimen Description URINE, CATHETERIZED   Final    Special Requests NONE   Final    Culture  Setup Time 12/15/2011 08:38   Final    Colony Count NO GROWTH   Final    Culture NO GROWTH   Final    Report Status 12/16/2011  FINAL   Final   CLOSTRIDIUM DIFFICILE BY PCR     Status: Normal   Collection Time   12/15/11  6:30 PM      Component Value Range Status Comment   C difficile by pcr NEGATIVE  NEGATIVE Final     Studies/Results: No results found.  Medications: Scheduled Meds:    . ciprofloxacin  250 mg Oral BID  . cycloSPORINE  1 drop Both Eyes QID  . gabapentin  300 mg Oral Daily  . gabapentin  600 mg Oral QHS  . isosorbide  mononitrate  60 mg Oral Daily  . LORazepam  0.25 mg Oral QHS  . metroNIDAZOLE  250 mg Oral Q8H  . predniSONE  2.5 mg Oral Q M,W,F  . Tamsulosin HCl  0.4 mg Oral Daily  . [DISCONTINUED] ciprofloxacin  400 mg Intravenous Q24H  . [DISCONTINUED] metronidazole  500 mg Intravenous Q8H   Continuous Infusions:    . [DISCONTINUED] sodium chloride 1,000 mL (12/17/11 0825)   PRN Meds:.ondansetron (ZOFRAN) IV, ondansetron, zolpidem  Assessment/Plan: 1. L sided colitis: likely ischemic, BP was also low on admission  improving Advance diet , change to PO cipro/flagyl,  cdiff PCR negative With some streaks of blood overnight 12/4, 2 gm drop in Hb, but dilution could be contributing too, CBC stable since, will need GI FU for colonoscopy as outpatient.  2. Acute on chronic urinary retention: Foley Dced at 6am 12/5 then with blood clots per urethra and urinary retention, I called and discussed with Dr.Nesi, Urology who recommended replacing the foley catheter which was done 12/5  and having him FU with Dr.Ottelin with the Foley for further workup, CT/USG without hydronephrosis  Continue flomax  3. Patient's creatinine is 2.38, and his last creatinine in October was 1.7, Renal ultrasound without hydronephrosis, creatinine improved, stop IVF, hold lasix and lisinopril  4. Hyperkalemia: due to ARF and ACE, hydrate, hold lisinopril, resolved   5. Transient hypotension: resolved with IVF in ER  PT/OT ambulate CSW consult for SNF  Time spent coordinating care:   LOS: 4 days   [facility] Pager: [phone] 12/18/2011, 8:18 AM

## 2011-12-19 LAB — CBC
MCH: 32.3 pg (ref 26.0–34.0)
MCHC: 34.3 g/dL (ref 30.0–36.0)
Platelets: 153 10*3/uL (ref 150–400)

## 2011-12-19 LAB — BASIC METABOLIC PANEL
Calcium: 8.2 mg/dL — ABNORMAL LOW (ref 8.4–10.5)
GFR calc non Af Amer: 51 mL/min — ABNORMAL LOW (ref >90)
Sodium: 140 mEq/L (ref 135–145)

## 2011-12-19 NOTE — Clinical Social Work Note (Signed)
CSW consulted by MD re: SNF placement. CSW spoke with patient re: SNF options. Patient stated he is not interested in SNF at this time, and will d/c home. Per chart review the patient is ambulating 150 feet without any pain. CSW signing off, no other psychosocial concerns identified. Please re-consult as needed.  Lia Foyer, LCSWA [facility] Clinical Social Worker Contact #: [phone] (weekend)

## 2011-12-20 MED ORDER — CARVEDILOL 25 MG PO TABS: 25.0000 mg | ORAL_TABLET | Freq: Two times a day (BID) | ORAL | Status: DC

## 2011-12-20 MED ORDER — TAMSULOSIN HCL 0.4 MG PO CAPS: 0.4000 mg | ORAL_CAPSULE | Freq: Every day | ORAL | Status: DC

## 2011-12-20 MED ORDER — FUROSEMIDE 40 MG PO TABS: 20.0000 mg | ORAL_TABLET | Freq: Every day | ORAL | Status: DC

## 2011-12-20 NOTE — Progress Notes (Signed)
All d/c instructions explained and given to pt.  Verbalized understanding.  Pt d/c home.  Escorted to car by w/c.  Amanda Pea, Charity fundraiser.

## 2011-12-20 NOTE — Progress Notes (Signed)
[facility]             Progress Note   Subjective: Denies any complaints, undecided about home vs SNF  Objective: Vital signs in last 24 hours: Temp:  [97.3 F (36.3 C)-98.9 F (37.2 C)] 98.9 F (37.2 C) (12/09 0531) Pulse Rate:  [73-86] 74  (12/09 0531) Resp:  [20] 20  (12/09 0531) BP: (103-118)/(53-67) 106/63 mmHg (12/09 0531) SpO2:  [96 %-100 %] 96 % (12/09 0531) Weight:  [70.716 kg (155 lb 14.4 oz)] 70.716 kg (155 lb 14.4 oz) (12/09 0531) Weight change: 0.816 kg (1 lb 12.8 oz) Last BM Date: 12/18/11  Intake/Output from previous day: 12/08 0701 - 12/09 0700 In: 1080 [P.O.:1080] Out: 902 [Urine:900; Stool:2]     Physical Exam: General: Alert, awake, oriented x3, in no acute distress. HEENT: No bruits, no goiter. Heart: Regular rate and rhythm, without murmurs, rubs, gallops. Lungs: Clear to auscultation bilaterally. Abdomen: Soft, nontender, nondistended, positive bowel sounds. Extremities: No clubbing cyanosis or edema with positive pedal pulses. Neuro: Grossly intact, nonfocal.    Lab Results: Basic Metabolic Panel:  Basename 12/19/11 0500 12/18/11 0650  NA 140 138  K 3.2* 3.9  CL 110 106  CO2 22 21  GLUCOSE 100* 100*  BUN 21 23  CREATININE 1.25 1.45*  CALCIUM 8.2* 8.8  MG -- --  PHOS -- --   Liver Function Tests: No results found for this basename: AST:2,ALT:2,ALKPHOS:2,BILITOT:2,PROT:2,ALBUMIN:2 in the last 72 hours No results found for this basename: LIPASE:2,AMYLASE:2 in the last 72 hours No results found for this basename: AMMONIA:2 in the last 72 hours CBC:  Basename 12/19/11 0500 12/18/11 0136  WBC 6.1 8.1  NEUTROABS -- --  HGB 10.2* 10.5*  HCT 29.7* 30.1*  MCV 94.0 93.8  PLT 153 149*   Cardiac Enzymes: No results found for this basename: CKTOTAL:3,CKMB:3,CKMBINDEX:3,TROPONINI:3 in the last 72 hours BNP: No results found for this basename: PROBNP:3 in the last 72 hours D-Dimer: No results found for this basename:  DDIMER:2 in the last 72 hours CBG: No results found for this basename: GLUCAP:6 in the last 72 hours Hemoglobin A1C: No results found for this basename: HGBA1C in the last 72 hours Fasting Lipid Panel: No results found for this basename: CHOL,HDL,LDLCALC,TRIG,CHOLHDL,LDLDIRECT in the last 72 hours Thyroid Function Tests: No results found for this basename: TSH,T4TOTAL,FREET4,T3FREE,THYROIDAB in the last 72 hours Anemia Panel: No results found for this basename: VITAMINB12,FOLATE,FERRITIN,TIBC,IRON,RETICCTPCT in the last 72 hours Coagulation: No results found for this basename: LABPROT:2,INR:2 in the last 72 hours Urine Drug Screen: Drugs of Abuse  No results found for this basename: labopia,  cocainscrnur,  labbenz,  amphetmu,  thcu,  labbarb    Alcohol Level: No results found for this basename: ETH:2 in the last 72 hours Urinalysis: No results found for this basename: COLORURINE:2,APPERANCEUR:2,LABSPEC:2,PHURINE:2,GLUCOSEU:2,HGBUR:2,BILIRUBINUR:2,KETONESUR:2,PROTEINUR:2,UROBILINOGEN:2,NITRITE:2,LEUKOCYTESUR:2 in the last 72 hours  Recent Results (from the past 240 hour(s))  URINE CULTURE     Status: Normal   Collection Time   12/15/11 12:50 AM      Component Value Range Status Comment   Specimen Description URINE, CATHETERIZED   Final    Special Requests NONE   Final    Culture  Setup Time 12/15/2011 08:38   Final    Colony Count NO GROWTH   Final    Culture NO GROWTH   Final    Report Status 12/16/2011 FINAL   Final   CLOSTRIDIUM DIFFICILE BY PCR     Status: Normal   Collection Time  12/15/11  6:30 PM      Component Value Range Status Comment   C difficile by pcr NEGATIVE  NEGATIVE Final     Studies/Results: No results found.  Medications: Scheduled Meds:    . ciprofloxacin  250 mg Oral BID  . cycloSPORINE  1 drop Both Eyes QID  . gabapentin  300 mg Oral Daily  . gabapentin  600 mg Oral QHS  . isosorbide mononitrate  60 mg Oral Daily  . LORazepam  0.25 mg Oral QHS   . metroNIDAZOLE  250 mg Oral Q8H  . predniSONE  2.5 mg Oral Q M,W,F  . Tamsulosin HCl  0.4 mg Oral Daily   Continuous Infusions:   PRN Meds:.ondansetron (ZOFRAN) IV, ondansetron, zolpidem  Assessment/Plan: 1. L sided colitis: likely ischemic, BP was also low on admission  improving Advance diet , change to PO cipro/flagyl,  cdiff PCR negative With some streaks of blood overnight 12/4, 2 gm drop in Hb, but dilution could be contributing too, CBC stable since, will need GI FU for colonoscopy as outpatient.  2. Acute on chronic urinary retention: Foley Dced at 6am 12/5 then with blood clots per urethra and urinary retention, I called and discussed with Dr.Nesi, Urology who recommended replacing the foley catheter which was done 12/5  and having him FU with Dr.Ottelin with the Foley for further workup, CT/USG without hydronephrosis  Continue flomax Home with Foley with Urology follow up  3. Patient's creatinine is 2.38, and his last creatinine in October was 1.7, Renal ultrasound without hydronephrosis, creatinine improved, stop IVF, hold lasix and lisinopril  4. Hyperkalemia: due to ARF and ACE, hydrate, hold lisinopril, resolved   5. Transient hypotension: resolved with IVF in ER  PT/OT ambulate CSW consult for SNF  Time spent coordinating care:   LOS: 6 days   [facility] Pager: [phone] 12/20/2011, 7:36 AM

## 2011-12-23 NOTE — Discharge Summary (Signed)
Physician Discharge Summary  Patient ID: [name] MRN: [id number] DOB/AGE: [birth year] 78 y.o.  Admit date: 12/14/2011 Discharge date: 12/23/2011  Primary Care Physician:  Judie Petit, MD   Disposition and Follow-up:  1. Dr.Ottelin in 1 week 2. GI in 2-3 weeks, needs screening colonoscopy 3. PCP Dr.Swords, in 1 week 4. Home health services    Discharge Diagnoses:   Active Problems:  Colitis, likely ischemic, improved  Urinary retention  CAD  CABG  CVA   Scant bright red blood per rectum        Medication List     As of 12/23/2011 11:08 PM    STOP taking these medications         isosorbide mononitrate 60 MG 24 hr tablet   Commonly known as: IMDUR      lisinopril 5 MG tablet   Commonly known as: PRINIVIL,ZESTRIL      TAKE these medications         allopurinol 300 MG tablet   Commonly known as: ZYLOPRIM   Take 300 mg by mouth daily.      carvedilol 25 MG tablet   Commonly known as: COREG   Take 1 tablet (25 mg total) by mouth 2 (two) times daily with a meal.      cycloSPORINE 0.05 % ophthalmic emulsion   Commonly known as: RESTASIS   Place 1 drop into both eyes 4 (four) times daily.      ezetimibe-simvastatin 10-40 MG per tablet   Commonly known as: VYTORIN   Take 1 tablet by mouth at bedtime.      fluticasone 50 MCG/ACT nasal spray   Commonly known as: FLONASE   Place 2 sprays into the nose daily.      furosemide 40 MG tablet   Commonly known as: LASIX   Take 0.5 tablets (20 mg total) by mouth daily.      gabapentin 300 MG capsule   Commonly known as: NEURONTIN   Take 300-600 mg by mouth 2 (two) times daily. He takes one capsule in the morning and two capsules at bedtime.      LORazepam 0.5 MG tablet   Commonly known as: ATIVAN   Take 0.5 tablets (0.25 mg total) by mouth at bedtime.      nitroGLYCERIN 0.4 MG SL tablet   Commonly known as: NITROSTAT   Place 0.4 mg under the tongue every 5 (five) minutes as needed. For chest pain.       predniSONE 5 MG tablet   Commonly known as: DELTASONE   Take 2.5 mg by mouth every Monday, Wednesday, and Friday.      Tamsulosin HCl 0.4 MG Caps   Commonly known as: FLOMAX   Take 1 capsule (0.4 mg total) by mouth daily.        Consults: Discussed with Dr.Nesi, Urology   Significant Diagnostic Studies:  No results found.  Brief H and P: 78 year old male who lives alone came to the hospital after patient experienced one day of difficulty in urinating. Patient says that he had increased frequency of urination but only had few drops of urine every time he tried. Patient denies any history of benign prostate hypertrophy. Last night patient says that he developed diarrhea with abdominal pain and while he was sitting in toilet he had muscle cramp which made him fall. Patient hasn't loss of consciousness. Patient is a good historian. Patient denies any chest pain no shortness of breath. He also had vomiting when patient arrived in the  ED. Patient denies any fever   Hospital Course:   1. L sided colitis: likely ischemic, BP was also low on admission  Improved, Advance diet , change to PO cipro/flagyl, cdiff PCR negative  With some streaks of blood overnight 12/4, asymptomatic drop in Hb, but dilution could be contributing too, CBC stable since, will need GI FU for colonoscopy as outpatient.  2. Acute on chronic urinary retention: Foley Dced at 6am 12/5 then with blood clots per urethra and urinary retention, I called and discussed with Dr.Nesi, he recommended replacing the foley catheter which was done 12/5 and having him FU with Dr.Ottelin with the Foley for further workup, CT/USG without hydronephrosis  Continue flomax  Home with Foley with Dr.Ottelin next week  3. Patient's creatinine is 2.38, and his last creatinine in October was 1.7, Renal ultrasound without hydronephrosis, creatinine improved to normal range, treated with IVF and holding lasix and lisinopril  4. Hyperkalemia: due  to ARF and ACE, hydrate, hold lisinopril, resolved  5. Transient hypotension: resolved with IVF in ER      Time spent on Discharge:  Signed: Tavion Senkbeil [facility]  12/23/2011, 11:08 PM

## 2011-12-29 ENCOUNTER — Encounter: Payer: Self-pay | Admitting: Family

## 2011-12-29 ENCOUNTER — Ambulatory Visit (INDEPENDENT_AMBULATORY_CARE_PROVIDER_SITE_OTHER): Payer: Medicare Other | Admitting: Family

## 2011-12-29 VITALS — BP 126/84 | HR 77 | Temp 97.5°F | Wt 154.0 lb

## 2011-12-29 DIAGNOSIS — R609 Edema, unspecified: Secondary | ICD-10-CM

## 2011-12-29 DIAGNOSIS — N289 Disorder of kidney and ureter, unspecified: Secondary | ICD-10-CM

## 2011-12-29 DIAGNOSIS — K5289 Other specified noninfective gastroenteritis and colitis: Secondary | ICD-10-CM

## 2011-12-29 DIAGNOSIS — K529 Noninfective gastroenteritis and colitis, unspecified: Secondary | ICD-10-CM

## 2011-12-29 DIAGNOSIS — R600 Localized edema: Secondary | ICD-10-CM

## 2011-12-29 DIAGNOSIS — R339 Retention of urine, unspecified: Secondary | ICD-10-CM

## 2011-12-29 LAB — CBC WITH DIFFERENTIAL/PLATELET
Basophils Relative: 0.6 % (ref 0.0–3.0)
Eosinophils Relative: 5.7 % — ABNORMAL HIGH (ref 0.0–5.0)
MCV: 96.7 fl (ref 78.0–100.0)
Monocytes Absolute: 0.6 10*3/uL (ref 0.1–1.0)
Neutrophils Relative %: 64 % (ref 43.0–77.0)
RBC: 3.61 Mil/uL — ABNORMAL LOW (ref 4.22–5.81)
WBC: 9.9 10*3/uL (ref 4.5–10.5)

## 2011-12-29 LAB — BASIC METABOLIC PANEL
BUN: 25 mg/dL — ABNORMAL HIGH (ref 6–23)
Creatinine, Ser: 1.8 mg/dL — ABNORMAL HIGH (ref 0.4–1.5)
GFR: 39.5 mL/min — ABNORMAL LOW (ref >60.00)

## 2011-12-29 LAB — HEPATIC FUNCTION PANEL
ALT: 33 U/L (ref 0–53)
Total Bilirubin: 0.8 mg/dL (ref 0.3–1.2)

## 2011-12-29 NOTE — Progress Notes (Signed)
Subjective:    Patient ID: [name], male    DOB: Sep 06, 1924, 78 y.o.   MRN: [id number]  HPI 78 year old white male, nonsmoker, patient of Dr. Cato Mulligan is in as an emergency department followup. He was seen in the emergency department on 12/14/2011 hypotension, colitis, renal insufficiency, and acute urinary retention. Since that time, patient has been wearing a Foley with a leg bag. Since that time, patient has been doing very well. He has a followup tomorrow with urology.   Review of Systems  Constitutional: Negative.   Respiratory: Negative.   Cardiovascular: Negative.   Gastrointestinal: Negative.  Negative for abdominal pain.  Genitourinary: Negative for dysuria, hematuria and decreased urine volume.       Has urinary retention  Skin: Negative.   Neurological: Negative.   Hematological: Negative.   Psychiatric/Behavioral: Negative.    Past Medical History  Diagnosis Date  . Coronary artery disease   . Stroke   . Benign prostatic hypertrophy   . Myocardial infarction   . Shortness of breath   . Dysrhythmia   . Heart murmur     History   Social History  . Marital Status: Widowed    Spouse Name: N/A    Number of Children: N/A  . Years of Education: N/A   Occupational History  . Not on file.   Social History Main Topics  . Smoking status: Former Smoker    Quit date: 01/12/1988  . Smokeless tobacco: Never Used  . Alcohol Use: No  . Drug Use: No  . Sexually Active: No   Other Topics Concern  . Not on file   Social History Narrative  . No narrative on file    Past Surgical History  Procedure Date  . Appendectomy   . Coronary artery bypass graft   . Hemorrhoid surgery   . Knee arthroscopy   . Elbow surgery   . Cataract extraction     x2    Family History  Problem Relation Age of Onset  . Stroke Father     Allergies  Allergen Reactions  . Nsaids     REACTION: renal failure    Current Outpatient Prescriptions on File Prior to Visit   Medication Sig Dispense Refill  . allopurinol (ZYLOPRIM) 300 MG tablet Take 300 mg by mouth daily.      . carvedilol (COREG) 25 MG tablet Take 1 tablet (25 mg total) by mouth 2 (two) times daily with a meal.  30 tablet  0  . cycloSPORINE (RESTASIS) 0.05 % ophthalmic emulsion Place 1 drop into both eyes 4 (four) times daily.      Marland Kitchen ezetimibe-simvastatin (VYTORIN) 10-40 MG per tablet Take 1 tablet by mouth at bedtime.      . fluticasone (FLONASE) 50 MCG/ACT nasal spray Place 2 sprays into the nose daily.      . furosemide (LASIX) 40 MG tablet Take 0.5 tablets (20 mg total) by mouth daily.  30 tablet  0  . gabapentin (NEURONTIN) 300 MG capsule Take 300-600 mg by mouth 2 (two) times daily. He takes one capsule in the morning and two capsules at bedtime.      Marland Kitchen LORazepam (ATIVAN) 0.5 MG tablet Take 0.5 tablets (0.25 mg total) by mouth at bedtime.  30 tablet  3  . nitroGLYCERIN (NITROSTAT) 0.4 MG SL tablet Place 0.4 mg under the tongue every 5 (five) minutes as needed. For chest pain.      . predniSONE (DELTASONE) 5 MG tablet Take 2.5 mg by  mouth every Monday, Wednesday, and Friday.       . Tamsulosin HCl (FLOMAX) 0.4 MG CAPS Take 1 capsule (0.4 mg total) by mouth daily.  30 capsule  0    BP 126/84  Pulse 77  Temp 97.5 F (36.4 C) (Oral)  Wt 154 lb (69.854 kg)  SpO2 96%chart    Objective:   Physical Exam  Constitutional: He appears well-developed and well-nourished.  Neck: Normal range of motion. Neck supple.  Cardiovascular: Normal rate, regular rhythm and normal heart sounds.   Pulmonary/Chest: Effort normal and breath sounds normal.  Abdominal: Bowel sounds are normal.  Musculoskeletal: He exhibits edema. He exhibits no tenderness.       Bilateral 2+ pitting edema  Neurological: He is alert.  Skin: Skin is warm and dry.  Psychiatric: He has a normal mood and affect.          Assessment & Plan:  Assessment: Colitis-resolved, Hypotension-resolved, Renal Insufficiency, Urinary  Retention with Hydronephrosis, Peripheral Edema  Plan: Lab sent to reassess kidney function to include BMP, LFTs, CBC. If his renal function is still compromised, will refer him to nephrology for further management. He is currently on Lasix 40 mg daily but continues to have bilateral peripheral edema. Continue current medications. Follow with urology as scheduled. Recheck with Dr. Cato Mulligan in 2-3 months and sooner when necessary. And

## 2011-12-29 NOTE — Patient Instructions (Addendum)
Acute Urinary Retention, Male  You have been seen by a caregiver today because of your inability to urinate (pass your water).  This is a common problem in elderly males. As men age their prostates become larger and block the flow of urine from the bladder. This is usually a problem that has come on gradually. It is often first noticed by having to get up at night to urinate. This is because as the prostate enlarges it is more difficult to empty the bladder completely.  Treatment may involve a one time catheterization to empty the bladder. This is putting in a tube to drain your urine. Then you and your personal caregiver can decide at your earliest convenience how to handle this problem in the future. It may also be a problem that may not recur for years. Sometimes this problem can be caused by medications. In this case, all that is often necessary is to discontinue the offending agent.  If you are to leave the foley catheter (a long, narrow, hollow tube) in and go home with a drainage system, you will need to discuss the best course of action with your caregiver. While the catheter is in, maintain a good intake of fluids. Keep the drainage bag emptied and lower than your catheter. This is so contaminated (infected) urine will not be flowing back into your bladder. This could lead to a urinary tract infection.  Only take over-the-counter or prescription medicines for pain, discomfort, or fever as directed by your caregiver.   SEEK IMMEDIATE MEDICAL CARE IF:   You develop chills, fever, or show signs of generalized illness that occurs prior to seeing your caregiver.  Document Released: 04/05/2000 Document Revised: 03/22/2011 Document Reviewed: 12/20/2007  Exit[employer] Patient Information 2013 [employer].

## 2012-01-02 ENCOUNTER — Other Ambulatory Visit: Payer: Self-pay | Admitting: Internal Medicine

## 2012-01-28 ENCOUNTER — Other Ambulatory Visit: Payer: Self-pay | Admitting: Internal Medicine

## 2012-01-29 ENCOUNTER — Other Ambulatory Visit: Payer: Self-pay | Admitting: Internal Medicine

## 2012-02-13 NOTE — Progress Notes (Signed)
Patient ID: [name], male   DOB: 25-Aug-1924, 79 y.o.   MRN: [id number] Urinary retention- resolved, reviewed Dr. Margrett Rud note  Sleep- some better with gabapentin  Lipids- tolerating meds  Renal insufficiency- needs f/u  Ischemic colitis- reviewed d/c summary  Reviewed pmh, psh, soc hx, meds   patient denies chest pain, shortness of breath, orthopnea. Denies lower extremity edema, abdominal pain, change in appetite, change in bowel movements. Patient denies rashes, musculoskeletal complaints. No other specific complaints in a complete review of systems.    well-developed well-nourished male in no acute distress. HEENT exam atraumatic, normocephalic, neck supple without jugular venous distention. Chest clear to auscultation cardiac exam S1-S2 are regular. Abdominal exam overweight with bowel sounds, soft and nontender. Extremities no edema. Neurologic exam is alert with a normal gait.

## 2012-02-14 ENCOUNTER — Encounter: Payer: Self-pay | Admitting: Internal Medicine

## 2012-02-14 ENCOUNTER — Other Ambulatory Visit: Payer: Self-pay | Admitting: *Deleted

## 2012-02-14 ENCOUNTER — Ambulatory Visit (INDEPENDENT_AMBULATORY_CARE_PROVIDER_SITE_OTHER): Payer: Medicare Other | Admitting: Internal Medicine

## 2012-02-14 VITALS — BP 154/74 | HR 68 | Temp 98.3°F | Wt 160.0 lb

## 2012-02-14 DIAGNOSIS — D649 Anemia, unspecified: Secondary | ICD-10-CM

## 2012-02-14 DIAGNOSIS — K529 Noninfective gastroenteritis and colitis, unspecified: Secondary | ICD-10-CM

## 2012-02-14 DIAGNOSIS — I10 Essential (primary) hypertension: Secondary | ICD-10-CM

## 2012-02-14 DIAGNOSIS — N19 Unspecified kidney failure: Secondary | ICD-10-CM

## 2012-02-14 DIAGNOSIS — K5289 Other specified noninfective gastroenteritis and colitis: Secondary | ICD-10-CM

## 2012-02-14 LAB — BASIC METABOLIC PANEL
BUN: 28 mg/dL — ABNORMAL HIGH (ref 6–23)
Chloride: 106 mEq/L (ref 96–112)
GFR: 49.05 mL/min — ABNORMAL LOW (ref >60.00)
Glucose, Bld: 91 mg/dL (ref 70–99)
Potassium: 4.5 mEq/L (ref 3.5–5.1)

## 2012-02-14 LAB — CBC WITH DIFFERENTIAL/PLATELET
Basophils Absolute: 0.1 10*3/uL (ref 0.0–0.1)
Eosinophils Absolute: 0.1 10*3/uL (ref 0.0–0.7)
HCT: 33.9 % — ABNORMAL LOW (ref 39.0–52.0)
Lymphs Abs: 2.2 10*3/uL (ref 0.7–4.0)
MCHC: 33.2 g/dL (ref 30.0–36.0)
MCV: 96.5 fl (ref 78.0–100.0)
Monocytes Absolute: 0.7 10*3/uL (ref 0.1–1.0)
Platelets: 228 10*3/uL (ref 150.0–400.0)
RDW: 15.4 % — ABNORMAL HIGH (ref 11.5–14.6)

## 2012-02-14 MED ORDER — LORAZEPAM 0.5 MG PO TABS: 0.2500 mg | ORAL_TABLET | Freq: Every day | ORAL | Status: DC

## 2012-02-14 NOTE — Assessment & Plan Note (Signed)
Probably multifactorial Will check labs today

## 2012-02-14 NOTE — Assessment & Plan Note (Signed)
BP Readings from Last 3 Encounters:  02/14/12 154/74  12/29/11 126/84  12/20/11 106/63   Will monitor-- previous bps have been well controlled Note he is currently off ACE-I due to renal insufficiency

## 2012-02-14 NOTE — Assessment & Plan Note (Signed)
Check labs. Has regular f/u with otelin

## 2012-02-14 NOTE — Assessment & Plan Note (Signed)
Reviewed CT scan. Clinically he is doing well. I don't see that doing a colonoscopy will add much to his care

## 2012-02-15 ENCOUNTER — Other Ambulatory Visit: Payer: Self-pay | Admitting: Internal Medicine

## 2012-02-15 ENCOUNTER — Ambulatory Visit: Payer: Medicare Other | Admitting: Internal Medicine

## 2012-02-16 ENCOUNTER — Telehealth: Payer: Self-pay | Admitting: *Deleted

## 2012-02-16 NOTE — Telephone Encounter (Signed)
Mr. Botelho called and said you wrote down 2 doctors name for his fingernail and the skin cancer to call.  He has lost that paper and wanted to know the names you gave him

## 2012-02-17 NOTE — Telephone Encounter (Signed)
Pt informed and number was given

## 2012-02-17 NOTE — Telephone Encounter (Signed)
He had a concern about two skin conditions. Have him see dr. Terri Piedra

## 2012-03-02 ENCOUNTER — Other Ambulatory Visit: Payer: Self-pay | Admitting: Surgery

## 2012-03-13 ENCOUNTER — Telehealth: Payer: Self-pay | Admitting: Internal Medicine

## 2012-03-13 MED ORDER — LORAZEPAM 0.5 MG PO TABS: 0.5000 mg | ORAL_TABLET | Freq: Every day | ORAL | Status: DC

## 2012-03-13 NOTE — Telephone Encounter (Signed)
Is this ok to increase to 1 tablet?  He has taken one tablet in the past

## 2012-03-13 NOTE — Telephone Encounter (Signed)
rx called into pharmacy

## 2012-03-13 NOTE — Telephone Encounter (Signed)
Pharm called to clarify pt instructions for LORazepam (ATIVAN) 0.5 MG tablet.  Pt states he takes 1 at bedtime. New instructions say 1/2 at bedtime. Now pt is out. pls advise.

## 2012-03-13 NOTE — Telephone Encounter (Signed)
Ok to take 1

## 2012-03-14 ENCOUNTER — Other Ambulatory Visit: Payer: Self-pay | Admitting: Internal Medicine

## 2012-03-17 ENCOUNTER — Encounter: Payer: Medicare Other | Admitting: Internal Medicine

## 2012-03-23 ENCOUNTER — Ambulatory Visit (INDEPENDENT_AMBULATORY_CARE_PROVIDER_SITE_OTHER): Payer: Medicare Other | Admitting: Internal Medicine

## 2012-03-23 VITALS — BP 114/66

## 2012-03-23 DIAGNOSIS — I10 Essential (primary) hypertension: Secondary | ICD-10-CM

## 2012-03-23 NOTE — Progress Notes (Signed)
Pt comes in today for nurse visit BP check.  BP was normal told pt to stay on current meds for now.  Med list updated.  Forwarded note to Dr Cato Mulligan for review

## 2012-03-24 ENCOUNTER — Other Ambulatory Visit: Payer: Self-pay | Admitting: Internal Medicine

## 2012-04-13 ENCOUNTER — Other Ambulatory Visit: Payer: Self-pay | Admitting: Surgery

## 2012-04-15 ENCOUNTER — Other Ambulatory Visit: Payer: Self-pay | Admitting: Internal Medicine

## 2012-05-22 ENCOUNTER — Other Ambulatory Visit: Payer: Self-pay | Admitting: *Deleted

## 2012-05-22 MED ORDER — GABAPENTIN 300 MG PO CAPS: ORAL_CAPSULE | ORAL | Status: DC

## 2012-05-24 ENCOUNTER — Other Ambulatory Visit: Payer: Self-pay | Admitting: *Deleted

## 2012-05-24 MED ORDER — ALLOPURINOL 300 MG PO TABS: 300.0000 mg | ORAL_TABLET | Freq: Every day | ORAL | Status: DC

## 2012-05-30 ENCOUNTER — Telehealth: Payer: Self-pay | Admitting: Internal Medicine

## 2012-05-30 MED ORDER — LISINOPRIL 20 MG PO TABS: 20.0000 mg | ORAL_TABLET | Freq: Every day | ORAL | Status: DC

## 2012-05-30 NOTE — Telephone Encounter (Signed)
rx sent in electronically 

## 2012-05-30 NOTE — Telephone Encounter (Signed)
Pt would like for [facility] to [facility] lisinopril 20 mg. Please advise.

## 2012-05-31 ENCOUNTER — Other Ambulatory Visit: Payer: Self-pay | Admitting: Dermatology

## 2012-06-18 ENCOUNTER — Other Ambulatory Visit: Payer: Self-pay | Admitting: Internal Medicine

## 2012-07-05 ENCOUNTER — Ambulatory Visit (INDEPENDENT_AMBULATORY_CARE_PROVIDER_SITE_OTHER): Payer: Medicare Other | Admitting: Internal Medicine

## 2012-07-05 ENCOUNTER — Encounter: Payer: Self-pay | Admitting: Internal Medicine

## 2012-07-05 VITALS — BP 144/90 | HR 64 | Temp 97.7°F | Ht 72.0 in | Wt 162.0 lb

## 2012-07-05 DIAGNOSIS — F411 Generalized anxiety disorder: Secondary | ICD-10-CM

## 2012-07-05 DIAGNOSIS — N19 Unspecified kidney failure: Secondary | ICD-10-CM

## 2012-07-05 DIAGNOSIS — G4700 Insomnia, unspecified: Secondary | ICD-10-CM

## 2012-07-05 DIAGNOSIS — I2510 Atherosclerotic heart disease of native coronary artery without angina pectoris: Secondary | ICD-10-CM

## 2012-07-05 LAB — TSH: TSH: 2.42 u[IU]/mL (ref 0.35–5.50)

## 2012-07-05 MED ORDER — EZETIMIBE-SIMVASTATIN 10-40 MG PO TABS: ORAL_TABLET | ORAL | Status: DC

## 2012-07-05 MED ORDER — LORAZEPAM 0.5 MG PO TABS: 0.5000 mg | ORAL_TABLET | Freq: Every day | ORAL | Status: DC

## 2012-07-05 NOTE — Progress Notes (Signed)
Patient ID: [name], male   DOB: [birth year], 79 y.o.   MRN: [id number]  Patient with multiple medical problems complains of depression- he describes this as an overwhelming sense of anxiety and at times he will cry uncontrollably. He has an Rx for ativan but is not using it currently.  Re viewed pmh, psh, sochx Re viewed meds   well-developed well-nourished male in no acute distress. HEENT exam atraumatic, normocephalic, neck supple without jugular venous distention. Chest clear to auscultation cardiac exam S1-S2 are regular. Abdominal exam overweight with bowel sounds, soft and nontender. Extremities no edema. Neurologic exam is alert with a normal gait.   A/p anxiety- depression- initially will resume ativan prn because his sxs are intermittent. He will call if he continues to have problems Will check thyroid (tsh) on the off chance that this is contributing to anxiety

## 2012-07-05 NOTE — Assessment & Plan Note (Signed)
No sxs Continue risk factor modification 

## 2012-07-05 NOTE — Assessment & Plan Note (Signed)
He states his sleep is much better because he is taking night time meds earlier

## 2012-07-05 NOTE — Assessment & Plan Note (Signed)
Lab Results  Component Value Date   CREATININE 1.5 02/14/2012   stable

## 2012-07-18 ENCOUNTER — Other Ambulatory Visit: Payer: Self-pay | Admitting: *Deleted

## 2012-07-18 MED ORDER — FUROSEMIDE 40 MG PO TABS: ORAL_TABLET | ORAL | Status: DC

## 2012-07-18 MED ORDER — ISOSORBIDE MONONITRATE ER 60 MG PO TB24: ORAL_TABLET | ORAL | Status: DC

## 2012-07-18 MED ORDER — PREDNISONE 5 MG PO TABS: ORAL_TABLET | ORAL | Status: DC

## 2012-07-18 MED ORDER — CARVEDILOL 25 MG PO TABS: 25.0000 mg | ORAL_TABLET | Freq: Two times a day (BID) | ORAL | Status: DC

## 2012-07-19 ENCOUNTER — Ambulatory Visit (INDEPENDENT_AMBULATORY_CARE_PROVIDER_SITE_OTHER): Payer: Medicare Other | Admitting: Family Medicine

## 2012-07-19 ENCOUNTER — Encounter: Payer: Self-pay | Admitting: Family Medicine

## 2012-07-19 VITALS — BP 140/88 | Temp 97.6°F | Wt 167.0 lb

## 2012-07-19 DIAGNOSIS — H6120 Impacted cerumen, unspecified ear: Secondary | ICD-10-CM

## 2012-07-19 DIAGNOSIS — H6122 Impacted cerumen, left ear: Secondary | ICD-10-CM

## 2012-07-19 NOTE — Progress Notes (Signed)
Chief Complaint  Patient presents with  . ears clogged    HPI:  Acute Visit for Clogged Ears: -has had this before, L ear clogged -want lavage -no pain, fevers, drainage  ROS: See pertinent positives and negatives per HPI.  Past Medical History  Diagnosis Date  . Coronary artery disease   . Stroke   . Benign prostatic hypertrophy   . Myocardial infarction   . Shortness of breath   . Dysrhythmia   . Heart murmur   . Colitis 12/16/2011    Family History  Problem Relation Age of Onset  . Stroke Father     History   Social History  . Marital Status: Widowed    Spouse Name: N/A    Number of Children: N/A  . Years of Education: N/A   Social History Main Topics  . Smoking status: Former Smoker    Quit date: 01/12/1988  . Smokeless tobacco: Never Used  . Alcohol Use: No  . Drug Use: No  . Sexually Active: No   Other Topics Concern  . None   Social History Narrative  . None    Current outpatient prescriptions:allopurinol (ZYLOPRIM) 300 MG tablet, Take 1 tablet (300 mg total) by mouth daily., Disp: 90 tablet, Rfl: 1;  carvedilol (COREG) 25 MG tablet, Take 1 tablet (25 mg total) by mouth 2 (two) times daily with a meal., Disp: 90 tablet, Rfl: 3;  ezetimibe-simvastatin (VYTORIN) 10-40 MG per tablet, TAKE  ONE TABLET BY MOUTH NIGHTLY AT BEDTIME, Disp: 90 tablet, Rfl: 3 finasteride (PROSCAR) 5 MG tablet, Take 5 mg by mouth daily., Disp: , Rfl: ;  fluticasone (FLONASE) 50 MCG/ACT nasal spray, USE TWO SPRAYS IN EACH NOSTRIL DAILY, Disp: 16 g, Rfl: 0;  furosemide (LASIX) 40 MG tablet, TAKE ONE TABLET BY MOUTH ONE TIME DAILY, Disp: 90 tablet, Rfl: 3;  gabapentin (NEURONTIN) 300 MG capsule, THEN TAKE 1 CAPSULE INTHE MORNING AND 2 AT NIGHT., Disp: 270 capsule, Rfl: 1 isosorbide mononitrate (IMDUR) 60 MG 24 hr tablet, TAKE ONE TABLET BY MOUTH ONE TIME DAILY, Disp: 90 tablet, Rfl: 3;  lisinopril (PRINIVIL,ZESTRIL) 20 MG tablet, Take 1 tablet (20 mg total) by mouth daily., Disp: 90  tablet, Rfl: 3;  LORazepam (ATIVAN) 0.5 MG tablet, Take 1 tablet (0.5 mg total) by mouth at bedtime., Disp: 90 tablet, Rfl: 1;  NITROSTAT 0.4 MG SL tablet, DISSOLVE ONE TABLET UNDER TONGUE AS NEEDED, Disp: 50 tablet, Rfl: 1 predniSONE (DELTASONE) 5 MG tablet, TAKE HALF TABLET BY MOUTH EVERY OTHER DAY, Disp: 22 tablet, Rfl: 3;  Tamsulosin HCl (FLOMAX) 0.4 MG CAPS, Take 1 capsule (0.4 mg total) by mouth daily., Disp: 30 capsule, Rfl: 0  EXAM:  Filed Vitals:   07/19/12 1012  BP: 140/88  Temp: 97.6 F (36.4 C)    Body mass index is 22.64 kg/(m^2).  GENERAL: vitals reviewed and listed above, alert, oriented, appears well hydrated and in no acute distress  HEENT: atraumatic, conjunttiva clear, no obvious abnormalities on inspection of external nose and ears, L ear canal with cerumen impaction, R ear canal with small amount of soft wax  NECK: no obvious masses on inspection  MS: moves all extremities without noticeable abnormality  PSYCH: pleasant and cooperative, no obvious depression or anxiety  ASSESSMENT AND PLAN:  Discussed the following assessment and plan:  Cerumen impaction, left  -discussed options, wants ear lavage - discussed risks, staff performed ear lavage on L ear, clear after procedure -symptoms improved, tolerated well -Patient advised to return or notify  a doctor immediately if symptoms worsen or persist or new concerns arise.  There are no Patient Instructions on file for this visit.   Colin Benton R.

## 2012-08-10 ENCOUNTER — Telehealth: Payer: Self-pay | Admitting: *Deleted

## 2012-08-10 NOTE — Telephone Encounter (Signed)
Pt wanted to know Dr Cato Mulligan opinion on his rt shoulder pain.  He wants to have surgery to have the "arthritis removed" and he is wondering if Dr Cato Mulligan thinks he would be able to handle the surgery.  Per Dr Cato Mulligan, he does not think its worth the risk cause of pts age but he should contact Dr Audrie Lia office for advice.  Pt verbalized understanding, he will follow up with Dr Lajoyce Corners and is aware of Dr Cato Mulligan advice.

## 2012-09-15 ENCOUNTER — Telehealth: Payer: Self-pay | Admitting: Internal Medicine

## 2012-09-15 NOTE — Telephone Encounter (Addendum)
Pt came in again in regards to his EMS bill from 09/2010, the bill has now gone to collections and pt needs this cleared up before 9/27 of this month, he is moving into a new apartment.  He tried to get a personal loan this week but was denied due to the 484.78 collections balance. Patient states that he needs Dr. Cato Mulligan to write a letter to Medicare stating that his ambulance ride from hospital was medically necessary.  Please advise.  Arline Asp, I will come personally hand you the collections paper.  I also directed the patient to the [facility] building for further assistance with this matter.   Pt did go to [employer] building for further assistance, he would like to have this letter by Monday, he says he wont be able to move into his apartment without it.

## 2012-09-18 NOTE — Telephone Encounter (Signed)
done

## 2012-09-26 ENCOUNTER — Emergency Department (HOSPITAL_COMMUNITY)
Admission: EM | Admit: 2012-09-26 | Discharge: 2012-09-26 | Disposition: A | Discharge status: Home / Self Care | Payer: Medicare Other | Attending: Emergency Medicine | Admitting: Emergency Medicine

## 2012-09-26 ENCOUNTER — Encounter (HOSPITAL_COMMUNITY): Payer: Self-pay | Admitting: *Deleted

## 2012-09-26 ENCOUNTER — Emergency Department (HOSPITAL_COMMUNITY): Payer: Medicare Other

## 2012-09-26 DIAGNOSIS — I2510 Atherosclerotic heart disease of native coronary artery without angina pectoris: Secondary | ICD-10-CM | POA: Insufficient documentation

## 2012-09-26 DIAGNOSIS — W010XXA Fall on same level from slipping, tripping and stumbling without subsequent striking against object, initial encounter: Secondary | ICD-10-CM | POA: Insufficient documentation

## 2012-09-26 DIAGNOSIS — Y929 Unspecified place or not applicable: Secondary | ICD-10-CM | POA: Insufficient documentation

## 2012-09-26 DIAGNOSIS — W19XXXA Unspecified fall, initial encounter: Secondary | ICD-10-CM

## 2012-09-26 DIAGNOSIS — Z87448 Personal history of other diseases of urinary system: Secondary | ICD-10-CM | POA: Insufficient documentation

## 2012-09-26 DIAGNOSIS — Z8673 Personal history of transient ischemic attack (TIA), and cerebral infarction without residual deficits: Secondary | ICD-10-CM | POA: Insufficient documentation

## 2012-09-26 DIAGNOSIS — Z23 Encounter for immunization: Secondary | ICD-10-CM | POA: Insufficient documentation

## 2012-09-26 DIAGNOSIS — S51011A Laceration without foreign body of right elbow, initial encounter: Secondary | ICD-10-CM

## 2012-09-26 DIAGNOSIS — Z951 Presence of aortocoronary bypass graft: Secondary | ICD-10-CM | POA: Insufficient documentation

## 2012-09-26 DIAGNOSIS — Y9389 Activity, other specified: Secondary | ICD-10-CM | POA: Insufficient documentation

## 2012-09-26 DIAGNOSIS — S51009A Unspecified open wound of unspecified elbow, initial encounter: Secondary | ICD-10-CM | POA: Insufficient documentation

## 2012-09-26 DIAGNOSIS — Z79899 Other long term (current) drug therapy: Secondary | ICD-10-CM | POA: Insufficient documentation

## 2012-09-26 DIAGNOSIS — Z87891 Personal history of nicotine dependence: Secondary | ICD-10-CM | POA: Insufficient documentation

## 2012-09-26 DIAGNOSIS — R011 Cardiac murmur, unspecified: Secondary | ICD-10-CM | POA: Insufficient documentation

## 2012-09-26 DIAGNOSIS — I252 Old myocardial infarction: Secondary | ICD-10-CM | POA: Insufficient documentation

## 2012-09-26 DIAGNOSIS — Z8719 Personal history of other diseases of the digestive system: Secondary | ICD-10-CM | POA: Insufficient documentation

## 2012-09-26 LAB — CBC WITH DIFFERENTIAL/PLATELET
Basophils Relative: 1 % (ref 0–1)
Eosinophils Absolute: 0.4 10*3/uL (ref 0.0–0.7)
Eosinophils Relative: 5 % (ref 0–5)
Lymphs Abs: 1.8 10*3/uL (ref 0.7–4.0)
MCH: 31.5 pg (ref 26.0–34.0)
MCHC: 33.1 g/dL (ref 30.0–36.0)
MCV: 95.2 fL (ref 78.0–100.0)
Monocytes Relative: 9 % (ref 3–12)
Platelets: 183 10*3/uL (ref 150–400)
RBC: 4.13 MIL/uL — ABNORMAL LOW (ref 4.22–5.81)

## 2012-09-26 LAB — COMPREHENSIVE METABOLIC PANEL
BUN: 17 mg/dL (ref 6–23)
Calcium: 9.3 mg/dL (ref 8.4–10.5)
GFR calc Af Amer: 55 mL/min — ABNORMAL LOW (ref >90)
Glucose, Bld: 94 mg/dL (ref 70–99)
Sodium: 137 mEq/L (ref 135–145)
Total Protein: 7.1 g/dL (ref 6.0–8.3)

## 2012-09-26 LAB — TROPONIN I: Troponin I: <0.3 ng/mL (ref <0.30)

## 2012-09-26 MED ORDER — TETANUS-DIPHTH-ACELL PERTUSSIS 5-2.5-18.5 LF-MCG/0.5 IM SUSP
0.5000 mL | Freq: Once | INTRAMUSCULAR | Status: AC
  Administered 2012-09-26: 0.5 mL via INTRAMUSCULAR
  Filled 2012-09-26: qty 0.5

## 2012-09-26 NOTE — ED Provider Notes (Signed)
CSN: [id number]     Arrival date & time 09/26/12  1018 History   First MD Initiated Contact with Patient 09/26/12 1110     Chief Complaint  Patient presents with  . Fall   (Consider location/radiation/quality/duration/timing/severity/associated sxs/prior Treatment) HPI Comments: Patient presents with mechanical fall this morning. He was sleeping on his couch and became startled by a noise at the door and he got up quickly and fell. Denies hitting his head or losing consciousness. Denies any dizziness or lightheadedness. Denies any chest pain or shortness of breath. Complains of pain to his right elbow where he has a skin tear. No focal weakness, numbness or tingling. He is not on anticoagulation. Denies, head, neck, back pain. No chest pain or abdominal pain. No vomiting.  The history is provided by the patient.    Past Medical History  Diagnosis Date  . Coronary artery disease   . Stroke   . Benign prostatic hypertrophy   . Myocardial infarction   . Shortness of breath   . Dysrhythmia   . Heart murmur   . Colitis 12/16/2011   Past Surgical History  Procedure Laterality Date  . Appendectomy    . Coronary artery bypass graft    . Hemorrhoid surgery    . Knee arthroscopy    . Elbow surgery    . Cataract extraction      x2   Family History  Problem Relation Age of Onset  . Stroke Father    History  Substance Use Topics  . Smoking status: Former Smoker    Quit date: 01/12/1988  . Smokeless tobacco: Never Used  . Alcohol Use: No    Review of Systems  Constitutional: Negative for fever, activity change and appetite change.  HENT: Negative for congestion and rhinorrhea.   Respiratory: Negative for cough, chest tightness and shortness of breath.   Gastrointestinal: Negative for nausea, vomiting and abdominal pain.  Genitourinary: Negative for dysuria and hematuria.  Musculoskeletal: Positive for myalgias and arthralgias. Negative for gait problem.  Skin: Positive for  wound.  Neurological: Negative for dizziness, weakness and headaches.  A complete 10 system review of systems was obtained and all systems are negative except as noted in the HPI and PMH.    Allergies  Nsaids  Home Medications   Current Outpatient Rx  Name  Route  Sig  Dispense  Refill  . allopurinol (ZYLOPRIM) 300 MG tablet   Oral   Take 300 mg by mouth daily.         . carvedilol (COREG) 25 MG tablet   Oral   Take 25 mg by mouth 2 (two) times daily with a meal.         . ezetimibe-simvastatin (VYTORIN) 10-40 MG per tablet   Oral   Take 1 tablet by mouth at bedtime. TAKE  ONE TABLET BY MOUTH NIGHTLY AT BEDTIME         . finasteride (PROSCAR) 5 MG tablet   Oral   Take 5 mg by mouth daily.         . furosemide (LASIX) 40 MG tablet   Oral   Take 40 mg by mouth daily. TAKE ONE TABLET BY MOUTH ONE TIME DAILY         . gabapentin (NEURONTIN) 300 MG capsule   Oral   Take 300-600 mg by mouth 2 (two) times daily. THEN TAKE 1 CAPSULE INTHE MORNING AND 2 AT NIGHT.         Marland Kitchen isosorbide mononitrate (IMDUR)  60 MG 24 hr tablet   Oral   Take 60 mg by mouth daily. TAKE ONE TABLET BY MOUTH ONE TIME DAILY         . lisinopril (PRINIVIL,ZESTRIL) 20 MG tablet   Oral   Take 20 mg by mouth daily.         Marland Kitchen LORazepam (ATIVAN) 0.5 MG tablet   Oral   Take 0.5 mg by mouth at bedtime.         . nitroGLYCERIN (NITROSTAT) 0.4 MG SL tablet   Sublingual   Place 0.4 mg under the tongue every 5 (five) minutes as needed for chest pain.         . predniSONE (DELTASONE) 5 MG tablet   Oral   Take 2.5 mg by mouth every other day. TAKE HALF TABLET BY MOUTH EVERY OTHER DAY         . tamsulosin (FLOMAX) 0.4 MG CAPS capsule   Oral   Take 0.4 mg by mouth daily.          BP 169/67  Pulse 55  Temp(Src) 97.1 F (36.2 C) (Oral)  Resp 18  Ht 6' (1.829 m)  Wt 175 lb (79.379 kg)  BMI 23.73 kg/m2  SpO2 89% Physical Exam  Constitutional: He is oriented to person, place, and  time. He appears well-developed and well-nourished. No distress.  HENT:  Head: Normocephalic and atraumatic.  Mouth/Throat: Oropharynx is clear and moist. No oropharyngeal exudate.  Eyes: Conjunctivae and EOM are normal. Pupils are equal, round, and reactive to light.  Neck: Normal range of motion. Neck supple.  No C-spine tenderness, step-off or deformity  Cardiovascular: Normal rate and regular rhythm.   Murmur heard. Holosystolic murmur  Pulmonary/Chest: Effort normal and breath sounds normal. No respiratory distress.  Abdominal: Soft. There is no tenderness. There is no rebound and no guarding.  Musculoskeletal: Normal range of motion. He exhibits no edema and no tenderness.  Large skin tear to right elbow. No bony tenderness. +2 radial pulse, cardinal hand movements intact  Neurological: He is alert and oriented to person, place, and time. No cranial nerve deficit. He exhibits normal muscle tone. Coordination normal.  CN 2-12 intact, no ataxia on finger to nose, no nystagmus, 5/5 strength throughout, no pronator drift, Romberg negative, normal gait.   Skin: Skin is warm and dry.    ED Course  Procedures (including critical care time) Labs Review Labs Reviewed  CBC WITH DIFFERENTIAL - Abnormal; Notable for the following:    RBC 4.13 (*)    All other components within normal limits  COMPREHENSIVE METABOLIC PANEL - Abnormal; Notable for the following:    GFR calc non Af Amer 47 (*)    GFR calc Af Amer 55 (*)    All other components within normal limits  TROPONIN I   Imaging Review Dg Elbow Complete Right  09/26/2012   CLINICAL DATA:  Fall. Elbow injury and pain. Laceration.  EXAM: RIGHT ELBOW - COMPLETE 3+ VIEW  COMPARISON:  None.  FINDINGS: There is no evidence of fracture, dislocation, or joint effusion. Mild degenerative spurring noted. Osteopenia also demonstrated. Soft tissues are unremarkable. No evidence of radiopaque foreign body.  IMPRESSION: No acute findings.    Electronically Signed   By: Myles Rosenthal   On: 09/26/2012 12:55    MDM   1. Fall, initial encounter   2. Skin tear of elbow without complication, right, initial encounter    Mechanical fall with skin tear. No head injury. No neck  pain. No syncope.  No dizziness, lightheadness.  X-ray negative for fracture. Tetanus updated. Wound repaired with Steri-Strips by nursing staff.   Date: 09/26/2012  Rate: 50  Rhythm: normal sinus rhythm  QRS Axis: normal  Intervals: normal  ST/T Wave abnormalities: nonspecific ST/T changes  Conduction Disutrbances:right bundle branch block  Narrative Interpretation:   Old EKG Reviewed: unchanged    Glynn Octave, MD 09/26/12 1528

## 2012-09-26 NOTE — ED Notes (Signed)
Pt refused BP

## 2012-09-26 NOTE — ED Notes (Signed)
Pt reports getting startled this am when he got out of bed and pt tripped and fell, unsure what he hit his arm on but has skin tear to right elbow. Bandage applied pta.

## 2012-09-27 ENCOUNTER — Telehealth: Payer: Self-pay | Admitting: Internal Medicine

## 2012-09-27 NOTE — Telephone Encounter (Signed)
Patient Information:  Caller Name: [place]  Phone: [phone]  Patient: [name], [name]  Gender: Male  DOB: 01-May-1924  Age: 79 Years  PCP: Birdie Sons (Adults only)  Office Follow Up:  Does the office need to follow up with this patient?: Yes  Instructions For The Office: Pls see RN note  RN Note:  F/U call from ED visit on 9-16 after fall off couch.  Per Pt, "I heard knock on door at 530am on 9-16, knock startled me and I fell off couch", Pt scrapped Elbow, 2 inch in diameter, Pt went to ED d/t bleeding was not controlled.  Pt upset d/t ED worked him up for Cardiac, EKG and blood work, xray of elbow was negative. Pt felt he only needed sutures.  Pt was advised to f/u w/ Dr Cato Mulligan. Pt denies sxs at time of call. Per [facility], Pt had some abnormal Lab work.  Pt states he feels fine.  PLEASE HAVE MD REVIEW LAB WORK FROM ED VISIT ON 9-16 AND F/U W/ PT IF MD FEELS APPT IS NECESSARY.  Symptoms  Reason For Call & Symptoms: ER CALL. F/U from ED visit on 9-16 after fall  Reviewed Health History In EMR: N/A  Reviewed Medications In EMR: N/A  Reviewed Allergies In EMR: N/A  Reviewed Surgeries / Procedures: N/A  Date of Onset of Symptoms: 09/26/2012  Treatments Tried: Bandage w/ steri strips and wound cleaning, Tdap  Treatments Tried Worked: Yes  Guideline(s) Used:  No Protocol Available - Information Only  Disposition Per Guideline:   Discuss with PCP and Callback by Nurse Today  Reason For Disposition Reached:   Nursing judgment  Advice Given:  N/A  Patient Will Follow Care Advice:  YES

## 2012-09-27 NOTE — Telephone Encounter (Signed)
i dont see where labs are that abn-ov?

## 2012-09-29 NOTE — Telephone Encounter (Signed)
Pt informed

## 2012-09-29 NOTE — Telephone Encounter (Signed)
Labs are ok No need to f/u from ED

## 2012-10-02 ENCOUNTER — Telehealth: Payer: Self-pay | Admitting: Internal Medicine

## 2012-10-02 NOTE — Telephone Encounter (Signed)
Pt came in and would like you to fax a reorder of the following medications to [facility], Lorazepam and Gabapentin. Thank you.

## 2012-10-03 MED ORDER — GABAPENTIN 300 MG PO CAPS: ORAL_CAPSULE | ORAL | Status: DC

## 2012-10-03 MED ORDER — LORAZEPAM 0.5 MG PO TABS: 0.5000 mg | ORAL_TABLET | Freq: Every day | ORAL | Status: DC

## 2012-10-03 NOTE — Telephone Encounter (Signed)
rx's faxed to [facility] 

## 2012-10-16 ENCOUNTER — Other Ambulatory Visit: Payer: Self-pay | Admitting: *Deleted

## 2012-10-16 MED ORDER — LORAZEPAM 0.5 MG PO TABS: 0.5000 mg | ORAL_TABLET | Freq: Every day | ORAL | Status: DC

## 2012-11-03 ENCOUNTER — Ambulatory Visit (INDEPENDENT_AMBULATORY_CARE_PROVIDER_SITE_OTHER): Payer: Medicare Other

## 2012-11-03 DIAGNOSIS — Z23 Encounter for immunization: Secondary | ICD-10-CM

## 2012-11-21 ENCOUNTER — Other Ambulatory Visit: Payer: Self-pay | Admitting: Neurosurgery

## 2012-11-22 ENCOUNTER — Other Ambulatory Visit: Payer: Self-pay | Admitting: Internal Medicine

## 2012-12-12 ENCOUNTER — Other Ambulatory Visit (HOSPITAL_COMMUNITY): Payer: Medicare Other

## 2012-12-12 ENCOUNTER — Ambulatory Visit: Payer: Medicare Other | Admitting: Family

## 2012-12-15 ENCOUNTER — Ambulatory Visit: Payer: Medicare Other | Admitting: Family

## 2012-12-15 ENCOUNTER — Encounter: Payer: Self-pay | Admitting: Family

## 2012-12-15 ENCOUNTER — Other Ambulatory Visit (HOSPITAL_COMMUNITY): Payer: Medicare Other

## 2012-12-18 ENCOUNTER — Encounter (HOSPITAL_COMMUNITY): Payer: Self-pay | Admitting: Emergency Medicine

## 2012-12-18 ENCOUNTER — Emergency Department (HOSPITAL_COMMUNITY)
Admission: EM | Admit: 2012-12-18 | Discharge: 2012-12-18 | Disposition: A | Discharge status: Home / Self Care | Payer: Medicare Other | Attending: Emergency Medicine | Admitting: Emergency Medicine

## 2012-12-18 ENCOUNTER — Other Ambulatory Visit (HOSPITAL_COMMUNITY): Payer: Medicare Other

## 2012-12-18 ENCOUNTER — Ambulatory Visit: Payer: Medicare Other | Admitting: Family

## 2012-12-18 DIAGNOSIS — I2510 Atherosclerotic heart disease of native coronary artery without angina pectoris: Secondary | ICD-10-CM | POA: Insufficient documentation

## 2012-12-18 DIAGNOSIS — R300 Dysuria: Secondary | ICD-10-CM

## 2012-12-18 DIAGNOSIS — Z8719 Personal history of other diseases of the digestive system: Secondary | ICD-10-CM | POA: Insufficient documentation

## 2012-12-18 DIAGNOSIS — Z79899 Other long term (current) drug therapy: Secondary | ICD-10-CM | POA: Insufficient documentation

## 2012-12-18 DIAGNOSIS — R011 Cardiac murmur, unspecified: Secondary | ICD-10-CM | POA: Insufficient documentation

## 2012-12-18 DIAGNOSIS — I252 Old myocardial infarction: Secondary | ICD-10-CM | POA: Insufficient documentation

## 2012-12-18 DIAGNOSIS — N400 Enlarged prostate without lower urinary tract symptoms: Secondary | ICD-10-CM | POA: Insufficient documentation

## 2012-12-18 DIAGNOSIS — R52 Pain, unspecified: Secondary | ICD-10-CM | POA: Insufficient documentation

## 2012-12-18 DIAGNOSIS — Z87891 Personal history of nicotine dependence: Secondary | ICD-10-CM | POA: Insufficient documentation

## 2012-12-18 DIAGNOSIS — Z8673 Personal history of transient ischemic attack (TIA), and cerebral infarction without residual deficits: Secondary | ICD-10-CM | POA: Insufficient documentation

## 2012-12-18 DIAGNOSIS — M436 Torticollis: Secondary | ICD-10-CM | POA: Insufficient documentation

## 2012-12-18 DIAGNOSIS — Z951 Presence of aortocoronary bypass graft: Secondary | ICD-10-CM | POA: Insufficient documentation

## 2012-12-18 DIAGNOSIS — IMO0002 Reserved for concepts with insufficient information to code with codable children: Secondary | ICD-10-CM | POA: Insufficient documentation

## 2012-12-18 LAB — URINE MICROSCOPIC-ADD ON

## 2012-12-18 LAB — URINALYSIS, ROUTINE W REFLEX MICROSCOPIC
Nitrite: NEGATIVE
Specific Gravity, Urine: 1.02 (ref 1.005–1.030)
Urobilinogen, UA: 1 mg/dL (ref 0.0–1.0)

## 2012-12-18 MED ORDER — OXYCODONE-ACETAMINOPHEN 5-325 MG PO TABS: 1.0000 | ORAL_TABLET | Freq: Three times a day (TID) | ORAL | Status: DC | PRN

## 2012-12-18 MED ORDER — OXYCODONE-ACETAMINOPHEN 5-325 MG PO TABS
1.0000 | ORAL_TABLET | Freq: Once | ORAL | Status: AC
  Administered 2012-12-18: 1 via ORAL
  Filled 2012-12-18: qty 1

## 2012-12-18 NOTE — ED Provider Notes (Signed)
CSN: [id number]     Arrival date & time 12/18/12  0141 History   First MD Initiated Contact with Patient 12/18/12 0243     Chief Complaint  Patient presents with  . Arthritis   (Consider location/radiation/quality/duration/timing/severity/associated sxs/prior Treatment) HPI 79 year old male presents to emergency room with complaint of 2 days of bilateral pain extending from behind his ear down the sides of his neck.  Pain improves when he applies pressure behind his ears.  Pain worsens with movement of the head, especially when trying to look to the left.  No trauma to the area, no prior history of same.  Patient is concerned that his carotids are blocked bilaterally.  He has remote history of bilateral CEA.  His last ultrasound a year ago, was clean.  He denies any weakness or strokelike symptoms. Past Medical History  Diagnosis Date  . Coronary artery disease   . Stroke   . Benign prostatic hypertrophy   . Myocardial infarction   . Shortness of breath   . Dysrhythmia   . Heart murmur   . Colitis 12/16/2011   Past Surgical History  Procedure Laterality Date  . Appendectomy    . Coronary artery bypass graft    . Hemorrhoid surgery    . Knee arthroscopy    . Elbow surgery    . Cataract extraction      x2   Family History  Problem Relation Age of Onset  . Stroke Father    History  Substance Use Topics  . Smoking status: Former Smoker    Quit date: 01/12/1988  . Smokeless tobacco: Never Used  . Alcohol Use: No    Review of Systems  All other systems reviewed and are negative.    Allergies  Nsaids  Home Medications   Current Outpatient Rx  Name  Route  Sig  Dispense  Refill  . allopurinol (ZYLOPRIM) 300 MG tablet   Oral   Take 300 mg by mouth daily.         . carvedilol (COREG) 25 MG tablet   Oral   Take 25 mg by mouth 2 (two) times daily with a meal.         . ezetimibe-simvastatin (VYTORIN) 10-40 MG per tablet   Oral   Take 1 tablet by mouth at  bedtime. TAKE  ONE TABLET BY MOUTH NIGHTLY AT BEDTIME         . finasteride (PROSCAR) 5 MG tablet   Oral   Take 5 mg by mouth daily.         . furosemide (LASIX) 40 MG tablet   Oral   Take 40 mg by mouth daily. TAKE ONE TABLET BY MOUTH ONE TIME DAILY         . gabapentin (NEURONTIN) 300 MG capsule      TAKE 1 CAPSULE INTHE MORNING AND 2 AT NIGHT.   270 capsule   3   . isosorbide mononitrate (IMDUR) 60 MG 24 hr tablet   Oral   Take 60 mg by mouth daily. TAKE ONE TABLET BY MOUTH ONE TIME DAILY         . lisinopril (PRINIVIL,ZESTRIL) 20 MG tablet   Oral   Take 20 mg by mouth daily.         Marland Kitchen LORazepam (ATIVAN) 0.5 MG tablet   Oral   Take 1 tablet (0.5 mg total) by mouth at bedtime.   90 tablet   1   . NITROSTAT 0.4 MG SL tablet  Dissolve one tablet under tongue as needed   50 tablet   0   . oxyCODONE-acetaminophen (PERCOCET/ROXICET) 5-325 MG per tablet   Oral   Take 1 tablet by mouth every 8 (eight) hours as needed for severe pain.   30 tablet   0   . predniSONE (DELTASONE) 5 MG tablet   Oral   Take 2.5 mg by mouth every other day. TAKE HALF TABLET BY MOUTH EVERY OTHER DAY         . tamsulosin (FLOMAX) 0.4 MG CAPS capsule   Oral   Take 0.4 mg by mouth daily.          BP 116/53  Pulse 54  Temp(Src) 99 F (37.2 C) (Oral)  Resp 18  SpO2 97% Physical Exam  Nursing note and vitals reviewed. Constitutional: He is oriented to person, place, and time. He appears well-developed and well-nourished. He appears distressed.  HENT:  Head: Normocephalic and atraumatic.  Right Ear: External ear normal.  Left Ear: External ear normal.  Nose: Nose normal.  Mouth/Throat: Oropharynx is clear and moist. No oropharyngeal exudate.  Eyes: Conjunctivae and EOM are normal. Pupils are equal, round, and reactive to light.  Neck:  No bruits noted.  Patient tender over left SCM muscle, a patient of this area reproduces the pain.  Cardiovascular: Normal rate,  regular rhythm, normal heart sounds and intact distal pulses.  Exam reveals no gallop and no friction rub.   No murmur heard. Pulmonary/Chest: Effort normal and breath sounds normal. No respiratory distress. He has no wheezes. He has no rales. He exhibits no tenderness.  Abdominal: Soft. Bowel sounds are normal. He exhibits no distension and no mass. There is no tenderness. There is no rebound and no guarding.  Musculoskeletal: Normal range of motion. He exhibits no edema and no tenderness.  Neurological: He is alert and oriented to person, place, and time. He has normal reflexes. He displays normal reflexes. No cranial nerve deficit. Coordination normal.  Skin: Skin is warm and dry. No rash noted. No erythema. No pallor.    ED Course  Procedures (including critical care time) Labs Review Labs Reviewed  URINALYSIS, ROUTINE W REFLEX MICROSCOPIC - Abnormal; Notable for the following:    Color, Urine AMBER (*)    APPearance CLOUDY (*)    Hgb urine dipstick MODERATE (*)    Bilirubin Urine SMALL (*)    Ketones, ur 15 (*)    Protein, ur 30 (*)    Leukocytes, UA TRACE (*)    All other components within normal limits  URINE MICROSCOPIC-ADD ON - Abnormal; Notable for the following:    Squamous Epithelial / LPF FEW (*)    Bacteria, UA FEW (*)    Casts HYALINE CASTS (*)    All other components within normal limits   Imaging Review No results found.  EKG Interpretation   None       MDM   1. Torticollis, acute   2. Dysuria    88 rolled male with what appears to be torticollis, tried to reassure patient that I do not feel that his symptoms were due to bilateral carotid blockages.  Just prior to discharge, patient mentioned that he was also having increased frequency of urination and burning.  Urinalysis does not show any urinary tract infection.  We'll refer him back to his prior care Dr. and/or urology.    Olivia Mackie, MD 12/18/12 [phone]

## 2012-12-18 NOTE — ED Notes (Signed)
Pt states that he is having pain in the back of his head that he states is in the location of his carotid arteries.  Pt states when he puts pressure on an area behind his head, he is sometimes able to relieve the pain.

## 2012-12-18 NOTE — ED Notes (Signed)
Patient from home, with complaints of pressure in the back of his head and neck.  Patient is not hypertensive, has happened in the passed.  Patient is worried about his carotid arteries being blocked.

## 2012-12-21 ENCOUNTER — Encounter: Payer: Self-pay | Admitting: Family

## 2012-12-22 ENCOUNTER — Encounter: Payer: Self-pay | Admitting: Family

## 2012-12-22 ENCOUNTER — Other Ambulatory Visit: Payer: Self-pay | Admitting: Neurosurgery

## 2012-12-22 ENCOUNTER — Ambulatory Visit (HOSPITAL_COMMUNITY)
Admission: RE | Admit: 2012-12-22 | Discharge: 2012-12-22 | Disposition: A | Discharge status: Home / Self Care | Payer: Medicare Other | Source: Ambulatory Visit | Attending: Family | Admitting: Family

## 2012-12-22 ENCOUNTER — Ambulatory Visit (INDEPENDENT_AMBULATORY_CARE_PROVIDER_SITE_OTHER): Payer: Medicare Other | Admitting: Family

## 2012-12-22 DIAGNOSIS — I6529 Occlusion and stenosis of unspecified carotid artery: Secondary | ICD-10-CM

## 2012-12-22 DIAGNOSIS — Z48812 Encounter for surgical aftercare following surgery on the circulatory system: Secondary | ICD-10-CM | POA: Insufficient documentation

## 2012-12-22 NOTE — Patient Instructions (Signed)
Stroke Prevention Some medical conditions and behaviors are associated with an increased chance of having a stroke. You may prevent a stroke by making healthy choices and managing medical conditions. Reduce your risk of having a stroke by:  Staying physically active. Get at least 30 minutes of activity on most or all days.  Not smoking. It may also be helpful to avoid exposure to secondhand smoke.  Limiting alcohol use. Moderate alcohol use is considered to be:  No more than 2 drinks per day for men.  No more than 1 drink per day for nonpregnant women.  Eating healthy foods.  Include 5 or more servings of fruits and vegetables a day.  Certain diets may be prescribed to address high blood pressure, high cholesterol, diabetes, or obesity.  Managing your cholesterol levels.  A low-saturated fat, low-trans fat, low-cholesterol, and high-fiber diet may control cholesterol levels.  Take any prescribed medicines to control cholesterol as directed by your caregiver.  Managing your diabetes.  A controlled-carbohydrate, controlled-sugar diet is recommended to manage diabetes.  Take any prescribed medicines to control diabetes as directed by your caregiver.  Controlling your high blood pressure (hypertension).  A low-salt (sodium), low-saturated fat, low-trans fat, and low-cholesterol diet is recommended to manage high blood pressure.  Take any prescribed medicines to control hypertension as directed by your caregiver.  Maintaining a healthy weight.  A reduced-calorie, low-sodium, low-saturated fat, low-trans fat, low-cholesterol diet is recommended to manage weight.  Stopping drug abuse.  Avoiding birth control pills.  Talk to your caregiver about the risks of taking birth control pills if you are over 35 years old, smoke, get migraines, or have ever had a blood clot.  Getting evaluated for sleep disorders (sleep apnea).  Talk to your caregiver about getting a sleep evaluation  if you snore a lot or have excessive sleepiness.  Taking medicines as directed by your caregiver.  For some people, aspirin or blood thinners (anticoagulants) are helpful in reducing the risk of forming abnormal blood clots that can lead to stroke. If you have the irregular heart rhythm of atrial fibrillation, you should be on a blood thinner unless there is a good reason you cannot take them.  Understand all your medicine instructions. SEEK IMMEDIATE MEDICAL CARE IF:   You have sudden weakness or numbness of the face, arm, or leg, especially on one side of the body.  You have sudden confusion.  You have trouble speaking (aphasia) or understanding.  You have sudden trouble seeing in one or both eyes.  You have sudden trouble walking.  You have dizziness.  You have a loss of balance or coordination.  You have a sudden, severe headache with no known cause.  You have new chest pain or an irregular heartbeat. Any of these symptoms may represent a serious problem that is an emergency. Do not wait to see if the symptoms will go away. Get medical help right away. Call your local emergency services (911 in [place] Do not drive yourself to the hospital. Document Released: 02/05/2004 Document Revised: 03/22/2011 Document Reviewed: 06/30/2012 [employer] Patient Information 2014 [employer]  

## 2012-12-22 NOTE — Progress Notes (Signed)
Established Carotid Patient  History of Present Illness  [name] is a 79 y.o. male patient of Dr. Arbie Cookey status post bilateral CEAs in 2006 by Dr. Madilyn Fireman.   Patient has Positive history of TIA symptom as manifested by right arm weakness, 2 episodes, both in the 1970's, no TIA or stroke symptoms since then.  The patient denies amaurosis fugax or monocular blindness.  The patient  denies facial drooping.  Pt. denies hemiplegia.  The patient denies receptive or expressive aphasia.  Pt. denies extremity weakness.  The patient's previous neurologic deficits are Improved.   reports New Medical or Surgical History: [facility] ED evaluation last week for torticollis and dysuria, his symptoms have resolved since then.   Pt Diabetic: No Pt smoker: former smoker, quit 1990  Pt meds include: Statin : Yes Betablocker: Yes ASA: No, his PCP stopped the ASA that he was taking Other anticoagulants/antiplatelets: no   Past Medical History  Diagnosis Date  . Coronary artery disease   . Stroke   . Benign prostatic hypertrophy   . Myocardial infarction   . Shortness of breath   . Dysrhythmia   . Heart murmur   . Colitis 12/16/2011  . Irregular heart beat     Social History History  Substance Use Topics  . Smoking status: Former Smoker    Quit date: 01/12/1988  . Smokeless tobacco: Never Used  . Alcohol Use: No    Family History Family History  Problem Relation Age of Onset  . Stroke Father   . Heart attack Father   . Heart attack Mother   . Heart attack Brother   . Peripheral vascular disease Brother     Surgical History Past Surgical History  Procedure Laterality Date  . Appendectomy    . Coronary artery bypass graft    . Hemorrhoid surgery    . Knee arthroscopy    . Elbow surgery    . Cataract extraction      x2  . Eye surgery      Cataract  . Carotid endarterectomy Left 08-11-04    cea  . Carotid endarterectomy Right 10-08-04    cea    Allergies  Allergen Reactions  .  Nsaids     REACTION: renal failure    Current Outpatient Prescriptions  Medication Sig Dispense Refill  . allopurinol (ZYLOPRIM) 300 MG tablet Take 300 mg by mouth daily.      . carvedilol (COREG) 25 MG tablet Take 25 mg by mouth 2 (two) times daily with a meal.      . ezetimibe-simvastatin (VYTORIN) 10-40 MG per tablet Take 1 tablet by mouth at bedtime. TAKE  ONE TABLET BY MOUTH NIGHTLY AT BEDTIME      . finasteride (PROSCAR) 5 MG tablet Take 5 mg by mouth daily.      . furosemide (LASIX) 40 MG tablet Take 40 mg by mouth daily. TAKE ONE TABLET BY MOUTH ONE TIME DAILY      . gabapentin (NEURONTIN) 300 MG capsule TAKE 1 CAPSULE INTHE MORNING AND 2 AT NIGHT.  270 capsule  3  . isosorbide mononitrate (IMDUR) 60 MG 24 hr tablet Take 60 mg by mouth daily. TAKE ONE TABLET BY MOUTH ONE TIME DAILY      . lisinopril (PRINIVIL,ZESTRIL) 20 MG tablet Take 20 mg by mouth daily.      Marland Kitchen LORazepam (ATIVAN) 0.5 MG tablet Take 1 tablet (0.5 mg total) by mouth at bedtime.  90 tablet  1  . NITROSTAT 0.4  MG SL tablet Dissolve one tablet under tongue as needed  50 tablet  0  . oxyCODONE-acetaminophen (PERCOCET/ROXICET) 5-325 MG per tablet Take 1 tablet by mouth every 8 (eight) hours as needed for severe pain.  30 tablet  0  . predniSONE (DELTASONE) 5 MG tablet Take 2.5 mg by mouth every other day. TAKE HALF TABLET BY MOUTH EVERY OTHER DAY      . tamsulosin (FLOMAX) 0.4 MG CAPS capsule Take 0.4 mg by mouth daily.       No current facility-administered medications for this visit.    Review of Systems : [x]  Positive   [ ]  Denies  General:[ ]  Weight loss,  [ ]  Weight gain, [ ]  Loss of appetite, [ ]  Fever, [ ]  chills  Neurologic: [ ]  Dizziness, [ ]  Blackouts, [ ]  Headaches, [ ]  Seizure [ ]  Stroke, [ ]  "Mini stroke", [ ]  Slurred speech, [ ]  Temporary blindness;  [ ] weakness,  Ear/Nose/Throat: [ ]  Change in hearing, [ ]  Nose bleeds, [ ]  Hoarseness  Vascular:[ ]  Pain in legs with walking, [ ]  Pain in feet while  lying flat , [ ]   Non-healing ulcer, [ ]  Blood clot in vein,    Pulmonary: [ ]  Home oxygen, [ ]   Productive cough, [ ]  Bronchitis, [ ]  Coughing up blood,  [ ]  Asthma, [ ]  Wheezing  Musculoskeletal:  [ ]  Arthritis, [ ]  Joint pain, [ ]  low back pain  Cardiac: [ ]  Chest pain, [ ]  Shortness of breath when lying flat, [ ]  Shortness of breath with exertion, [ ]  Palpitations, [ ]  Heart murmur, [ ]   Atrial fibrillation  Hematologic:[ ]  Easy Bruising, [ ]  Anemia; [ ]  Hepatitis  Psychiatric: [ ]   Depression, [ ]  Anxiety   Gastrointestinal: [ ]  Black stool, [ ]  Blood in stool, [ ]  Peptic ulcer disease,  [ ]  Gastroesophageal Reflux, [ ]  Trouble swallowing, [ ]  Diarrhea, [ ]  Constipation  Urinary: [ ]  chronic Kidney disease, [ ]  on HD, [ ]  Burning with urination, [ ]  Frequent urination, [ ]  Difficulty urinating;   Skin: [ ]  Rashes, [ ]  Wounds    Physical Examination  Filed Vitals:   12/22/12 1208  BP: 130/62  Pulse: 55  Resp: 14   Filed Weights   12/22/12 1208  Weight: 147 lb (66.679 kg)   Body mass index is 19.93 kg/(m^2).   General: WDWN male in NAD GAIT: slow and deliberate Eyes: PERRLA Pulmonary:  CTAB, Negative  Rales, Negative rhonchi, & Negative wheezing.  Cardiac: regular Rhythm ,  Positive high grade Murmur.  VASCULAR EXAM Carotid Bruits Left Right   Negative Negative     Radial pulses are 2+ palpable and equal.                                                                                                                            LE Pulses LEFT RIGHT       POPLITEAL  not palpable   not palpable    Gastrointestinal: soft, nontender, BS WNL, no r/g,  negative masses.  Musculoskeletal: Age appropriate muscle atrophy/wasting. M/S 4/5 throughout, Extremities without ischemic changes.  Neurologic: A&O X 3; Appropriate Affect ; SENSATION ;normal;  Speech is normal CN 2-12 intact, Pain and light touch intact in extremities, Motor exam as listed  above.   Non-Invasive Vascular Imaging CAROTID DUPLEX 12/22/2012   Right ICA: <40% stenosis. Left ICA: 40 - 59 % stenosis.  Previous carotid studies demonstrated: RICA <40% stenosis, LICA <40% stenosis.  These findings are slightly Worse in the left ICA from previous exam.  Assessment: [name] is a 79 y.o. male who presents with asymptomatic minimal right ICA stenosis and 40-59% Left ICA  Stenosis. The left  ICA stenosis is slightly Worse from previous exam.  Plan: Follow-up in 1 year with Carotid Duplex scan.  Recommend daily 81 mg ASA to reduce risk of stroke, unless patient has a contraindication to this; he denies a history of GI bleed or ulcers, will defer to patient's PCP.    I discussed in depth with the patient the nature of atherosclerosis, and emphasized the importance of maximal medical management including strict control of blood pressure, blood glucose, and lipid levels, obtaining regular exercise, and continued cessation of smoking.  The patient is aware that without maximal medical management the underlying atherosclerotic disease process will progress, limiting the benefit of any interventions.   The patient was given information about stroke prevention and what symptoms should prompt the patient to seek immediate medical care. Thank you for allowing [place] to participate in this patient's care.  Charisse March, RN, MSN, FNP-C [facility] Office: [phone]  Clinic Physician: Imogene Burn  12/22/2012 12:20 PM

## 2012-12-30 ENCOUNTER — Other Ambulatory Visit: Payer: Self-pay | Admitting: Internal Medicine

## 2013-01-24 ENCOUNTER — Other Ambulatory Visit: Payer: Self-pay | Admitting: Internal Medicine

## 2013-02-03 ENCOUNTER — Encounter (HOSPITAL_COMMUNITY): Payer: Self-pay | Admitting: Emergency Medicine

## 2013-02-03 ENCOUNTER — Inpatient Hospital Stay (HOSPITAL_COMMUNITY)
Admission: EM | Admit: 2013-02-03 | Discharge: 2013-02-06 | DRG: 193 | Disposition: A | Discharge status: Home / Self Care | Payer: Medicare Other | Attending: Internal Medicine | Admitting: Internal Medicine

## 2013-02-03 ENCOUNTER — Emergency Department (HOSPITAL_COMMUNITY): Payer: Medicare Other

## 2013-02-03 DIAGNOSIS — R778 Other specified abnormalities of plasma proteins: Secondary | ICD-10-CM

## 2013-02-03 DIAGNOSIS — E876 Hypokalemia: Secondary | ICD-10-CM | POA: Diagnosis present

## 2013-02-03 DIAGNOSIS — J189 Pneumonia, unspecified organism: Secondary | ICD-10-CM

## 2013-02-03 DIAGNOSIS — N400 Enlarged prostate without lower urinary tract symptoms: Secondary | ICD-10-CM

## 2013-02-03 DIAGNOSIS — I350 Nonrheumatic aortic (valve) stenosis: Secondary | ICD-10-CM | POA: Diagnosis present

## 2013-02-03 DIAGNOSIS — I2510 Atherosclerotic heart disease of native coronary artery without angina pectoris: Secondary | ICD-10-CM

## 2013-02-03 DIAGNOSIS — I959 Hypotension, unspecified: Secondary | ICD-10-CM | POA: Diagnosis not present

## 2013-02-03 DIAGNOSIS — N401 Enlarged prostate with lower urinary tract symptoms: Secondary | ICD-10-CM | POA: Diagnosis present

## 2013-02-03 DIAGNOSIS — R399 Unspecified symptoms and signs involving the genitourinary system: Secondary | ICD-10-CM

## 2013-02-03 DIAGNOSIS — I498 Other specified cardiac arrhythmias: Secondary | ICD-10-CM | POA: Diagnosis present

## 2013-02-03 DIAGNOSIS — N138 Other obstructive and reflux uropathy: Secondary | ICD-10-CM | POA: Diagnosis present

## 2013-02-03 DIAGNOSIS — I10 Essential (primary) hypertension: Secondary | ICD-10-CM

## 2013-02-03 DIAGNOSIS — N179 Acute kidney failure, unspecified: Secondary | ICD-10-CM | POA: Diagnosis present

## 2013-02-03 DIAGNOSIS — I4719 Other supraventricular tachycardia: Secondary | ICD-10-CM

## 2013-02-03 DIAGNOSIS — Z87891 Personal history of nicotine dependence: Secondary | ICD-10-CM

## 2013-02-03 DIAGNOSIS — R799 Abnormal finding of blood chemistry, unspecified: Secondary | ICD-10-CM

## 2013-02-03 DIAGNOSIS — E785 Hyperlipidemia, unspecified: Secondary | ICD-10-CM

## 2013-02-03 DIAGNOSIS — R75 Inconclusive laboratory evidence of human immunodeficiency virus [HIV]: Secondary | ICD-10-CM

## 2013-02-03 DIAGNOSIS — R7989 Other specified abnormal findings of blood chemistry: Secondary | ICD-10-CM

## 2013-02-03 DIAGNOSIS — R0609 Other forms of dyspnea: Secondary | ICD-10-CM

## 2013-02-03 DIAGNOSIS — I255 Ischemic cardiomyopathy: Secondary | ICD-10-CM | POA: Diagnosis present

## 2013-02-03 DIAGNOSIS — I25810 Atherosclerosis of coronary artery bypass graft(s) without angina pectoris: Secondary | ICD-10-CM | POA: Diagnosis present

## 2013-02-03 DIAGNOSIS — I252 Old myocardial infarction: Secondary | ICD-10-CM

## 2013-02-03 DIAGNOSIS — I491 Atrial premature depolarization: Secondary | ICD-10-CM | POA: Diagnosis present

## 2013-02-03 DIAGNOSIS — I739 Peripheral vascular disease, unspecified: Secondary | ICD-10-CM | POA: Diagnosis present

## 2013-02-03 DIAGNOSIS — I4891 Unspecified atrial fibrillation: Secondary | ICD-10-CM

## 2013-02-03 DIAGNOSIS — I6529 Occlusion and stenosis of unspecified carotid artery: Secondary | ICD-10-CM

## 2013-02-03 DIAGNOSIS — I214 Non-ST elevation (NSTEMI) myocardial infarction: Secondary | ICD-10-CM

## 2013-02-03 DIAGNOSIS — I471 Supraventricular tachycardia: Secondary | ICD-10-CM | POA: Diagnosis present

## 2013-02-03 DIAGNOSIS — Z8673 Personal history of transient ischemic attack (TIA), and cerebral infarction without residual deficits: Secondary | ICD-10-CM

## 2013-02-03 DIAGNOSIS — J111 Influenza due to unidentified influenza virus with other respiratory manifestations: Secondary | ICD-10-CM | POA: Diagnosis present

## 2013-02-03 DIAGNOSIS — Z951 Presence of aortocoronary bypass graft: Secondary | ICD-10-CM

## 2013-02-03 DIAGNOSIS — I359 Nonrheumatic aortic valve disorder, unspecified: Secondary | ICD-10-CM | POA: Diagnosis present

## 2013-02-03 DIAGNOSIS — R6889 Other general symptoms and signs: Secondary | ICD-10-CM

## 2013-02-03 DIAGNOSIS — R748 Abnormal levels of other serum enzymes: Secondary | ICD-10-CM | POA: Diagnosis present

## 2013-02-03 DIAGNOSIS — M109 Gout, unspecified: Secondary | ICD-10-CM | POA: Diagnosis present

## 2013-02-03 DIAGNOSIS — I2589 Other forms of chronic ischemic heart disease: Secondary | ICD-10-CM | POA: Diagnosis present

## 2013-02-03 DIAGNOSIS — Z79899 Other long term (current) drug therapy: Secondary | ICD-10-CM

## 2013-02-03 HISTORY — DX: Pneumonia, unspecified organism: J18.9

## 2013-02-03 LAB — COMPREHENSIVE METABOLIC PANEL
ALT: 37 U/L (ref 0–53)
AST: 33 U/L (ref 0–37)
Albumin: 3 g/dL — ABNORMAL LOW (ref 3.5–5.2)
Alkaline Phosphatase: 92 U/L (ref 39–117)
BUN: 15 mg/dL (ref 6–23)
CALCIUM: 8 mg/dL — AB (ref 8.4–10.5)
CO2: 25 meq/L (ref 19–32)
CREATININE: 1.16 mg/dL (ref 0.50–1.35)
Chloride: 102 mEq/L (ref 96–112)
GFR calc Af Amer: 64 mL/min — ABNORMAL LOW (ref >90)
GFR calc non Af Amer: 55 mL/min — ABNORMAL LOW (ref >90)
Glucose, Bld: 101 mg/dL — ABNORMAL HIGH (ref 70–99)
Potassium: 3.8 mEq/L (ref 3.7–5.3)
SODIUM: 140 meq/L (ref 137–147)
TOTAL PROTEIN: 6.4 g/dL (ref 6.0–8.3)
Total Bilirubin: 1.7 mg/dL — ABNORMAL HIGH (ref 0.3–1.2)

## 2013-02-03 LAB — URINALYSIS, ROUTINE W REFLEX MICROSCOPIC
Bilirubin Urine: NEGATIVE
Glucose, UA: NEGATIVE mg/dL
KETONES UR: NEGATIVE mg/dL
LEUKOCYTES UA: NEGATIVE
Nitrite: NEGATIVE
Protein, ur: 30 mg/dL — AB
Specific Gravity, Urine: 1.02 (ref 1.005–1.030)
Urobilinogen, UA: 1 mg/dL (ref 0.0–1.0)
pH: 7 (ref 5.0–8.0)

## 2013-02-03 LAB — CBC WITH DIFFERENTIAL/PLATELET
BASOS ABS: 0 10*3/uL (ref 0.0–0.1)
BASOS PCT: 0 % (ref 0–1)
EOS ABS: 0.1 10*3/uL (ref 0.0–0.7)
EOS PCT: 1 % (ref 0–5)
HCT: 37.8 % — ABNORMAL LOW (ref 39.0–52.0)
Hemoglobin: 12.7 g/dL — ABNORMAL LOW (ref 13.0–17.0)
LYMPHS PCT: 13 % (ref 12–46)
Lymphs Abs: 1.3 10*3/uL (ref 0.7–4.0)
MCH: 31.4 pg (ref 26.0–34.0)
MCHC: 33.6 g/dL (ref 30.0–36.0)
MCV: 93.6 fL (ref 78.0–100.0)
Monocytes Absolute: 0.9 10*3/uL (ref 0.1–1.0)
Monocytes Relative: 10 % (ref 3–12)
Neutro Abs: 7.5 10*3/uL (ref 1.7–7.7)
Neutrophils Relative %: 77 % (ref 43–77)
PLATELETS: 150 10*3/uL (ref 150–400)
RBC: 4.04 MIL/uL — ABNORMAL LOW (ref 4.22–5.81)
RDW: 15.5 % (ref 11.5–15.5)
WBC: 9.8 10*3/uL (ref 4.0–10.5)

## 2013-02-03 LAB — URINE MICROSCOPIC-ADD ON

## 2013-02-03 LAB — POCT I-STAT TROPONIN I: Troponin i, poc: 0.36 ng/mL (ref 0.00–0.08)

## 2013-02-03 MED ORDER — ACETAMINOPHEN 325 MG PO TABS: 650.0000 mg | ORAL_TABLET | Freq: Four times a day (QID) | ORAL | Status: DC | PRN

## 2013-02-03 MED ORDER — GABAPENTIN 300 MG PO CAPS
600.0000 mg | ORAL_CAPSULE | Freq: Every day | ORAL | Status: DC
  Administered 2013-02-03 – 2013-02-05 (×3): 600 mg via ORAL
  Filled 2013-02-03 (×4): qty 2

## 2013-02-03 MED ORDER — AZITHROMYCIN 500 MG IV SOLR
500.0000 mg | Freq: Once | INTRAVENOUS | Status: AC
  Administered 2013-02-03: 500 mg via INTRAVENOUS

## 2013-02-03 MED ORDER — OSELTAMIVIR PHOSPHATE 75 MG PO CAPS
75.0000 mg | ORAL_CAPSULE | Freq: Once | ORAL | Status: AC
  Administered 2013-02-03: 75 mg via ORAL
  Filled 2013-02-03: qty 1

## 2013-02-03 MED ORDER — NITROGLYCERIN 0.4 MG SL SUBL: 0.4000 mg | SUBLINGUAL_TABLET | SUBLINGUAL | Status: DC | PRN

## 2013-02-03 MED ORDER — DOCUSATE SODIUM 100 MG PO CAPS
100.0000 mg | ORAL_CAPSULE | Freq: Two times a day (BID) | ORAL | Status: DC
  Administered 2013-02-03 – 2013-02-06 (×5): 100 mg via ORAL
  Filled 2013-02-03 (×7): qty 1

## 2013-02-03 MED ORDER — ASPIRIN 325 MG PO TABS
325.0000 mg | ORAL_TABLET | Freq: Once | ORAL | Status: AC
  Administered 2013-02-03: 325 mg via ORAL
  Filled 2013-02-03: qty 1

## 2013-02-03 MED ORDER — ACETAMINOPHEN 650 MG RE SUPP: 650.0000 mg | Freq: Four times a day (QID) | RECTAL | Status: DC | PRN

## 2013-02-03 MED ORDER — ISOSORBIDE MONONITRATE ER 60 MG PO TB24
60.0000 mg | ORAL_TABLET | Freq: Every day | ORAL | Status: DC
  Filled 2013-02-03: qty 1

## 2013-02-03 MED ORDER — ONDANSETRON HCL 4 MG/2ML IJ SOLN
4.0000 mg | Freq: Four times a day (QID) | INTRAMUSCULAR | Status: DC | PRN
Start: 2013-02-03 — End: 2013-02-06

## 2013-02-03 MED ORDER — GABAPENTIN 300 MG PO CAPS
300.0000 mg | ORAL_CAPSULE | Freq: Every day | ORAL | Status: DC
  Administered 2013-02-04 – 2013-02-06 (×3): 300 mg via ORAL
  Filled 2013-02-03 (×3): qty 1

## 2013-02-03 MED ORDER — BISACODYL 5 MG PO TBEC
5.0000 mg | DELAYED_RELEASE_TABLET | Freq: Every day | ORAL | Status: DC | PRN
  Filled 2013-02-03: qty 1

## 2013-02-03 MED ORDER — LISINOPRIL 20 MG PO TABS
20.0000 mg | ORAL_TABLET | Freq: Every day | ORAL | Status: DC
Start: 2013-02-04 — End: 2013-02-04
  Filled 2013-02-03: qty 1

## 2013-02-03 MED ORDER — ALUM & MAG HYDROXIDE-SIMETH 200-200-20 MG/5ML PO SUSP: 30.0000 mL | Freq: Four times a day (QID) | ORAL | Status: DC | PRN

## 2013-02-03 MED ORDER — ALLOPURINOL 300 MG PO TABS
300.0000 mg | ORAL_TABLET | Freq: Every day | ORAL | Status: DC
  Administered 2013-02-04 – 2013-02-06 (×3): 300 mg via ORAL
  Filled 2013-02-03 (×3): qty 1

## 2013-02-03 MED ORDER — AZITHROMYCIN 500 MG IV SOLR
500.0000 mg | INTRAVENOUS | Status: DC
  Administered 2013-02-05: 500 mg via INTRAVENOUS
  Filled 2013-02-03 (×2): qty 500

## 2013-02-03 MED ORDER — DEXTROSE 5 % IV SOLN
1.0000 g | Freq: Once | INTRAVENOUS | Status: AC
  Administered 2013-02-03: 1 g via INTRAVENOUS
  Filled 2013-02-03: qty 10

## 2013-02-03 MED ORDER — TAMSULOSIN HCL 0.4 MG PO CAPS
0.4000 mg | ORAL_CAPSULE | Freq: Every day | ORAL | Status: DC
  Administered 2013-02-04: 0.4 mg via ORAL
  Filled 2013-02-03: qty 1

## 2013-02-03 MED ORDER — EZETIMIBE-SIMVASTATIN 10-40 MG PO TABS
1.0000 | ORAL_TABLET | Freq: Every day | ORAL | Status: DC
  Administered 2013-02-03 – 2013-02-05 (×3): 1 via ORAL
  Filled 2013-02-03 (×4): qty 1

## 2013-02-03 MED ORDER — ONDANSETRON HCL 4 MG PO TABS: 4.0000 mg | ORAL_TABLET | Freq: Four times a day (QID) | ORAL | Status: DC | PRN

## 2013-02-03 MED ORDER — GABAPENTIN 300 MG PO CAPS: 300.0000 mg | ORAL_CAPSULE | Freq: Three times a day (TID) | ORAL | Status: DC

## 2013-02-03 MED ORDER — ALBUTEROL SULFATE (2.5 MG/3ML) 0.083% IN NEBU
2.5000 mg | INHALATION_SOLUTION | RESPIRATORY_TRACT | Status: DC | PRN
Start: 2013-02-03 — End: 2013-02-06

## 2013-02-03 MED ORDER — SODIUM CHLORIDE 0.9 % IV SOLN
INTRAVENOUS | Status: AC
  Administered 2013-02-03 – 2013-02-04 (×2): via INTRAVENOUS

## 2013-02-03 MED ORDER — ASPIRIN EC 81 MG PO TBEC
81.0000 mg | DELAYED_RELEASE_TABLET | Freq: Every day | ORAL | Status: DC
  Administered 2013-02-04 – 2013-02-06 (×3): 81 mg via ORAL
  Filled 2013-02-03 (×3): qty 1

## 2013-02-03 MED ORDER — SODIUM CHLORIDE 0.9 % IJ SOLN
3.0000 mL | Freq: Two times a day (BID) | INTRAMUSCULAR | Status: DC
  Administered 2013-02-03: 3 mL via INTRAVENOUS

## 2013-02-03 MED ORDER — HEPARIN SODIUM (PORCINE) 5000 UNIT/ML IJ SOLN
5000.0000 [IU] | Freq: Three times a day (TID) | INTRAMUSCULAR | Status: DC
  Administered 2013-02-03 – 2013-02-05 (×6): 5000 [IU] via SUBCUTANEOUS
  Filled 2013-02-03 (×8): qty 1

## 2013-02-03 MED ORDER — CARVEDILOL 25 MG PO TABS
25.0000 mg | ORAL_TABLET | Freq: Two times a day (BID) | ORAL | Status: DC
  Administered 2013-02-04 – 2013-02-06 (×5): 25 mg via ORAL
  Filled 2013-02-03 (×7): qty 1

## 2013-02-03 MED ORDER — PREDNISONE 2.5 MG PO TABS
2.5000 mg | ORAL_TABLET | ORAL | Status: DC
  Administered 2013-02-04 – 2013-02-06 (×2): 2.5 mg via ORAL
  Filled 2013-02-03 (×2): qty 1

## 2013-02-03 MED ORDER — FINASTERIDE 5 MG PO TABS
5.0000 mg | ORAL_TABLET | Freq: Every day | ORAL | Status: DC
  Administered 2013-02-04: 5 mg via ORAL
  Filled 2013-02-03: qty 1

## 2013-02-03 MED ORDER — SENNA 8.6 MG PO TABS
1.0000 | ORAL_TABLET | Freq: Two times a day (BID) | ORAL | Status: DC
  Administered 2013-02-03 – 2013-02-06 (×5): 8.6 mg via ORAL
  Filled 2013-02-03 (×7): qty 1

## 2013-02-03 MED ORDER — CEFTRIAXONE SODIUM 1 G IJ SOLR
1.0000 g | INTRAMUSCULAR | Status: DC
  Administered 2013-02-04 – 2013-02-05 (×2): 1 g via INTRAVENOUS
  Filled 2013-02-03 (×3): qty 10

## 2013-02-03 NOTE — ED Notes (Signed)
Presents with generalized body aches, chills and productive cough with green sputum "green slime. I get sick every January. Don't ask me why. I don't know. I was feeling good until Thursday afternoon" Sats 100% RA. Denies chest pain.

## 2013-02-03 NOTE — ED Notes (Signed)
Attempted to call report, nurse in pt room, will call back

## 2013-02-03 NOTE — ED Notes (Signed)
Lab results reported to EDP. 

## 2013-02-03 NOTE — H&P (Signed)
Patient Demographics  [name], is a 80 y.o. male  MRN: [id number]   DOB - 1924/03/08  Admit Date - 02/03/2013  Outpatient Primary MD for the patient is Judie Petit, MD   With History of -  Past Medical History  Diagnosis Date  . Coronary artery disease   . Stroke   . Benign prostatic hypertrophy   . Myocardial infarction   . Shortness of breath   . Dysrhythmia   . Heart murmur   . Colitis 12/16/2011  . Irregular heart beat       Past Surgical History  Procedure Laterality Date  . Appendectomy    . Coronary artery bypass graft    . Hemorrhoid surgery    . Knee arthroscopy    . Elbow surgery    . Cataract extraction      x2  . Eye surgery      Cataract  . Carotid endarterectomy Left 08-11-04    cea  . Carotid endarterectomy Right 10-08-04    cea    in for   Chief Complaint  Patient presents with  . Generalized Body Aches     HPI  [name]  is a 80 y.o. male, who presents from home for multiple complaints, mainly shortness of breath, cough with productive green sputum for the last 2 days, generalized body ache, and weakness, upon presentation patient was a febrile, did not have any hypoxia or shortness of breath, but his chest x-ray did show bilateral lung capacity, the patient was started on IV Rocephin and Zithromax for healthcare acquired pneumonia, as well he was started on Tamiflu empirically until his influenza results are back, patient was noticed to have elevated troponin at 0.36, patient denies any chest pain, patient EKG did show a right bundle branch block which appears to be old from previous EKG, but he did have mild ST depression in the anterior lateral leads, patient reports coronary artery disease and history of CABG in the past, the patient was given 325 mg of aspirin in  ED.    Review of Systems    In addition to the HPI above, No Fever-chills, complaints of generalized weakness and body ache, and loss of appetite No Headache, No changes with Vision or hearing, No problems swallowing food or Liquids, No Chest pain, complaints of productive cough and mild Shortness of Breath, No Abdominal pain, No Nausea or Vommitting, Bowel movements are regular, No Blood in stool or Urine, No dysuria, No new skin rashes or bruises, No new joints pains-aches,  Denies any new focal weakness, tingling, numbness in any extremity, No recent weight gain or loss, No polyuria, polydypsia or polyphagia, No significant Mental Stressors.  A full 10 point Review of Systems was done, except as stated above, all other Review of Systems were negative.   Social History History  Substance Use Topics  . Smoking status: Former Smoker    Quit date: 01/12/1988  . Smokeless tobacco:  Never Used  . Alcohol Use: No     Family History Family History  Problem Relation Age of Onset  . Stroke Father   . Heart attack Father   . Heart attack Mother   . Heart attack Brother   . Peripheral vascular disease Brother     Prior to Admission medications   Medication Sig Start Date End Date Taking? Authorizing Provider  allopurinol (ZYLOPRIM) 300 MG tablet Take 300 mg by mouth daily.   Yes Historical Provider, MD  carvedilol (COREG) 25 MG tablet Take 25 mg by mouth 2 (two) times daily with a meal. 07/18/12  Yes Bruce Romilda Garret, MD  ezetimibe-simvastatin (VYTORIN) 10-40 MG per tablet Take 1 tablet by mouth at bedtime.  07/05/12  Yes Lindley Magnus, MD  finasteride (PROSCAR) 5 MG tablet Take 5 mg by mouth daily.   Yes Historical Provider, MD  furosemide (LASIX) 40 MG tablet Take 40 mg by mouth daily.  07/18/12  Yes Lindley Magnus, MD  gabapentin (NEURONTIN) 300 MG capsule Take 300-600 mg by mouth 3 (three) times daily. Take 300 mg every morning & 600 mg at bedtime   Yes Historical Provider, MD   isosorbide mononitrate (IMDUR) 60 MG 24 hr tablet Take 60 mg by mouth daily.  07/18/12  Yes Bruce Romilda Garret, MD  lisinopril (PRINIVIL,ZESTRIL) 20 MG tablet Take 20 mg by mouth daily. 05/30/12  Yes Lindley Magnus, MD  nitroGLYCERIN (NITROSTAT) 0.4 MG SL tablet Place 0.4 mg under the tongue every 5 (five) minutes as needed for chest pain.   Yes Historical Provider, MD  predniSONE (DELTASONE) 5 MG tablet Take 2.5 mg by mouth every other day.  07/18/12  Yes Lindley Magnus, MD  tamsulosin (FLOMAX) 0.4 MG CAPS capsule Take 0.4 mg by mouth daily. 12/20/11   Zannie Cove, MD    Allergies  Allergen Reactions  . Nsaids     REACTION: renal failure    Physical Exam  Vitals  Blood pressure 136/72, pulse 90, temperature 98.1 F (36.7 C), temperature source Oral, resp. rate 24, SpO2 96.00%.   1. General elderly male lying in bed in NAD,    2. Normal affect and insight, Not Suicidal or Homicidal, Awake Alert, Oriented X 3.  3. No F.N deficits, ALL C.Nerves Intact, Strength 5/5 all 4 extremities, Sensation intact all 4 extremities, Plantars down going.  4. Ears and Eyes appear Normal, Conjunctivae clear, PERRLA. Moist Oral Mucosa.  5. Supple Neck, No JVD, No cervical lymphadenopathy appriciated, No Carotid Bruits.  6. Symmetrical Chest wall movement, Good air movement bilaterally, CTAB.  7. RRR, No Gallops, Rubs or Murmurs, No Parasternal Heave.  8. Positive Bowel Sounds, Abdomen Soft, Non tender, No organomegaly appriciated,No rebound -guarding or rigidity.  9.  No Cyanosis, Normal Skin Turgor, No Skin Rash or Bruise.  10. Good muscle tone,  joints appear normal , no effusions, Normal ROM.  11. No Palpable Lymph Nodes in Neck or Axillae    Data Review  CBC  Recent Labs Lab 02/03/13 1745  WBC 9.8  HGB 12.7*  HCT 37.8*  PLT 150  MCV 93.6  MCH 31.4  MCHC 33.6  RDW 15.5  LYMPHSABS 1.3  MONOABS 0.9  EOSABS 0.1  BASOSABS 0.0    ------------------------------------------------------------------------------------------------------------------  Chemistries   Recent Labs Lab 02/03/13 1745  NA 140  K 3.8  CL 102  CO2 25  GLUCOSE 101*  BUN 15  CREATININE 1.16  CALCIUM 8.0*  AST 33  ALT 37  ALKPHOS 92  BILITOT 1.7*   ------------------------------------------------------------------------------------------------------------------ CrCl is unknown because both a height and weight (above a minimum accepted value) are required for this calculation. ------------------------------------------------------------------------------------------------------------------ No results found for this basename: TSH, T4TOTAL, FREET3, T3FREE, THYROIDAB,  in the last 72 hours   Coagulation profile No results found for this basename: INR, PROTIME,  in the last 168 hours ------------------------------------------------------------------------------------------------------------------- No results found for this basename: DDIMER,  in the last 72 hours -------------------------------------------------------------------------------------------------------------------  Cardiac Enzymes No results found for this basename: CK, CKMB, TROPONINI, MYOGLOBIN,  in the last 168 hours ------------------------------------------------------------------------------------------------------------------ No components found with this basename: POCBNP,    ---------------------------------------------------------------------------------------------------------------  Urinalysis    Component Value Date/Time   COLORURINE YELLOW 02/03/2013 1915   APPEARANCEUR CLEAR 02/03/2013 1915   LABSPEC 1.020 02/03/2013 1915   PHURINE 7.0 02/03/2013 1915   GLUCOSEU NEGATIVE 02/03/2013 1915   HGBUR SMALL* 02/03/2013 1915   BILIRUBINUR NEGATIVE 02/03/2013 1915   BILIRUBINUR 1+ 10/18/2011 1409   KETONESUR NEGATIVE 02/03/2013 1915   PROTEINUR 30* 02/03/2013 1915    UROBILINOGEN 1.0 02/03/2013 1915   UROBILINOGEN 0.2 10/18/2011 1409   NITRITE NEGATIVE 02/03/2013 1915   NITRITE n 10/18/2011 1409   LEUKOCYTESUR NEGATIVE 02/03/2013 1915    ----------------------------------------------------------------------------------------------------------------  Imaging results:   Dg Chest 2 View  02/03/2013   CLINICAL DATA:  Generalized body aches, chills, cough and congestion with shortness of breath.  EXAM: CHEST  2 VIEW  COMPARISON:  12/15/2011 and CT 12/15/2011  FINDINGS: Lungs are adequately inflated with patchy mixed interstitial airspace density over the mid to lower lungs which may be due to pneumonia as there is likely a component of chronic basilar interstitial disease. There is stable cardiomegaly. Remainder the exam is unchanged.  IMPRESSION: Patchy mixed interstitial airspace density over the mid to lower lungs likely acute infection on a background of mild chronic interstitial disease.  Cardiomegaly.   Electronically Signed   By: Elberta Fortis M.D.   On: 02/03/2013 18:50    My personal review of EKG: Rhythm NSR, with old right bundle branch block, as mild ST depression in anterior lateral leads.   Assessment & Plan  Principal Problem:   CAP (community acquired pneumonia) Active Problems:   HYPERLIPIDEMIA   GOUT   HYPERTENSION   CORONARY ARTERY DISEASE   BPH (benign prostatic hyperplasia)   Elevated troponin    1. Pneumonia: patient has evidence of bilateral lung opacity on chest x-ray, presents with productive cough, patient will be treated for community-acquired pneumonia, will start him on Rocephin and Zithromax, will follow sputum cultures, we'll continue with gentle hydration, would have him on when necessary albuterol, as well given the fact patient had generalized body ache and weakness, will check influenza, and if positive will continue him on Tamiflu.  2. Elevated troponins: Patient denies any chest pain, but given the fact he has mild  changes in his EKG, and if significant history for coronary artery disease ,the patient will be admitted to telemetry unit, will be given 325 mg of aspirin right now, will be started on a baby aspirin daily, ED already consulted cardiology who will evaluate the patient, he is already on Vytorin, beta blockers, lisinopril, and imdur, and when necessary sublingual nitroglycerin  3. Hypertension. Continue patient on home medications but will hold Lasix until he is more stable  4. Hyperlipidemia. Continue with Vytorin  5. Gout. Continue with allopurinol  6. BPH. Continue with finasteride and Flomax  DVT Prophylaxis Heparin - AM Labs Ordered, also please review  Full Orders  Family Communication: Admission, patients condition and plan of care including tests being ordered have been discussed with the patient  who indicate understanding and agree with the plan and Code Status.  Code Status full Likely DC to home  Condition GUARDED   Time spent in minutes : 60 min   ELGERGAWY, DAWOOD M.D on 02/03/2013 at 8:57 PM  Between 7am to 7pm - After 7pm go to [email or web address] - password [email or web address]  And look for the night coverage person covering me after hours  [facility] Office  [phone]

## 2013-02-03 NOTE — ED Provider Notes (Signed)
CSN: [id number]     Arrival date & time 02/03/13  1650 History   First MD Initiated Contact with Patient 02/03/13 1653     Chief Complaint  Patient presents with  . Generalized Body Aches   (Consider location/radiation/quality/duration/timing/severity/associated sxs/prior Treatment) HPI 80 yo male presents with 2 day hx of diffuse body aches that are gradually worsening with associated fever, congestion, sore throat, and productive cough with yellow sputum production.  Admits frequency/urgency of urination with mild dysuria. Admits to couple episodes of loose stools but denies watery diarrhea. Denies N/V/C and abdominal pain. Admits to DOE but denies any chest pain. Patient appears in NAD. PMH significant for 40 pack year hx of smoking, CAD, Stroke, MI, Heart murmur,irregular heart beat, and BPH.  Patient denies any recent hospitalizations, surgeries, or travel. Patient denies hx of blood clots.  Past Medical History  Diagnosis Date  . Coronary artery disease   . Stroke   . Benign prostatic hypertrophy   . Myocardial infarction   . Shortness of breath   . Dysrhythmia   . Heart murmur   . Colitis 12/16/2011  . Irregular heart beat    Past Surgical History  Procedure Laterality Date  . Appendectomy    . Coronary artery bypass graft    . Hemorrhoid surgery    . Knee arthroscopy    . Elbow surgery    . Cataract extraction      x2  . Eye surgery      Cataract  . Carotid endarterectomy Left 08-11-04    cea  . Carotid endarterectomy Right 10-08-04    cea   Family History  Problem Relation Age of Onset  . Stroke Father   . Heart attack Father   . Heart attack Mother   . Heart attack Brother   . Peripheral vascular disease Brother    History  Substance Use Topics  . Smoking status: Former Smoker    Quit date: 01/12/1988  . Smokeless tobacco: Never Used  . Alcohol Use: No    Review of Systems  Constitutional: Negative for chills.  Genitourinary: Negative for hematuria,  discharge and testicular pain.  All other systems reviewed and are negative.    Allergies  Nsaids  Home Medications   Current Outpatient Rx  Name  Route  Sig  Dispense  Refill  . allopurinol (ZYLOPRIM) 300 MG tablet   Oral   Take 300 mg by mouth daily.         . carvedilol (COREG) 25 MG tablet   Oral   Take 25 mg by mouth 2 (two) times daily with a meal.         . ezetimibe-simvastatin (VYTORIN) 10-40 MG per tablet   Oral   Take 1 tablet by mouth at bedtime.          . finasteride (PROSCAR) 5 MG tablet   Oral   Take 5 mg by mouth daily.         . furosemide (LASIX) 40 MG tablet   Oral   Take 40 mg by mouth daily.          Marland Kitchen gabapentin (NEURONTIN) 300 MG capsule   Oral   Take 300-600 mg by mouth 3 (three) times daily. Take 300 mg every morning & 600 mg at bedtime         . isosorbide mononitrate (IMDUR) 60 MG 24 hr tablet   Oral   Take 60 mg by mouth daily.          Marland Kitchen  lisinopril (PRINIVIL,ZESTRIL) 20 MG tablet   Oral   Take 20 mg by mouth daily.         . nitroGLYCERIN (NITROSTAT) 0.4 MG SL tablet   Sublingual   Place 0.4 mg under the tongue every 5 (five) minutes as needed for chest pain.         . predniSONE (DELTASONE) 5 MG tablet   Oral   Take 2.5 mg by mouth every other day.          . tamsulosin (FLOMAX) 0.4 MG CAPS capsule   Oral   Take 0.4 mg by mouth daily.          BP 152/91  Pulse 71  Temp(Src) 98.1 F (36.7 C) (Oral)  Resp 20  SpO2 100% Physical Exam  Nursing note and vitals reviewed. Constitutional: He is oriented to person, place, and time. He appears well-developed and well-nourished. He is cooperative. He does not appear ill. No distress. Nasal cannula in place.  HENT:  Head: Normocephalic and atraumatic.  Right Ear: Tympanic membrane and ear canal normal.  Left Ear: Tympanic membrane and ear canal normal.  Nose: Nose normal.  Mouth/Throat: Uvula is midline, oropharynx is clear and moist and mucous membranes  are normal. Mucous membranes are not cyanotic. No oropharyngeal exudate or posterior oropharyngeal edema.  Eyes: Conjunctivae and EOM are normal. Right conjunctiva is not injected. Left conjunctiva is not injected. No scleral icterus.  Neck: Normal range of motion and phonation normal. Neck supple. No JVD present. No rigidity. No tracheal deviation, no edema, no erythema and normal range of motion present.  Cardiovascular: Intact distal pulses.  Tachycardia present.  Exam reveals no gallop and no friction rub.   Murmur (hx of heart murmur) heard. Pulmonary/Chest: Effort normal and breath sounds normal. No respiratory distress. He has no wheezes. He has no rales.  Abdominal: Soft. Bowel sounds are normal. He exhibits no distension and no mass. There is no tenderness. There is no rebound and no guarding.  Genitourinary: Penis normal. Right testis shows tenderness. Right testis shows no mass and no swelling. Right testis is descended. Left testis shows no mass, no swelling and no tenderness. Left testis is descended. No penile erythema or penile tenderness. No discharge found.  Musculoskeletal: Normal range of motion. He exhibits no edema.  Lymphadenopathy:    He has no cervical adenopathy.  Neurological: He is alert and oriented to person, place, and time.  Skin: Skin is warm and dry. He is not diaphoretic.  Psychiatric: He has a normal mood and affect. His behavior is normal.    ED Course  Procedures (including critical care time) Labs Review Labs Reviewed  URINALYSIS, ROUTINE W REFLEX MICROSCOPIC - Abnormal; Notable for the following:    Hgb urine dipstick SMALL (*)    Protein, ur 30 (*)    All other components within normal limits  CBC WITH DIFFERENTIAL - Abnormal; Notable for the following:    RBC 4.04 (*)    Hemoglobin 12.7 (*)    HCT 37.8 (*)    All other components within normal limits  COMPREHENSIVE METABOLIC PANEL - Abnormal; Notable for the following:    Glucose, Bld 101 (*)     Calcium 8.0 (*)    Albumin 3.0 (*)    Total Bilirubin 1.7 (*)    GFR calc non Af Amer 55 (*)    GFR calc Af Amer 64 (*)    All other components within normal limits  URINE MICROSCOPIC-ADD ON - Abnormal;  Notable for the following:    Squamous Epithelial / LPF FEW (*)    All other components within normal limits  POCT I-STAT TROPONIN I - Abnormal; Notable for the following:    Troponin i, poc 0.36 (*)    All other components within normal limits  URINE CULTURE  RESPIRATORY VIRUS PANEL   Imaging Review Dg Chest 2 View  02/03/2013   CLINICAL DATA:  Generalized body aches, chills, cough and congestion with shortness of breath.  EXAM: CHEST  2 VIEW  COMPARISON:  12/15/2011 and CT 12/15/2011  FINDINGS: Lungs are adequately inflated with patchy mixed interstitial airspace density over the mid to lower lungs which may be due to pneumonia as there is likely a component of chronic basilar interstitial disease. There is stable cardiomegaly. Remainder the exam is unchanged.  IMPRESSION: Patchy mixed interstitial airspace density over the mid to lower lungs likely acute infection on a background of mild chronic interstitial disease.  Cardiomegaly.   Electronically Signed   By: Elberta Fortisaniel  Boyle M.D.   On: 02/03/2013 18:50    EKG Interpretation    Date/Time:  Saturday February 03 2013 17:52:41 EST Ventricular Rate:  95 PR Interval:    QRS Duration: 157 QT Interval:  405 QTC Calculation: 509 R Axis:   57 Text Interpretation:  Atrial fibrillation Ventricular premature complex Right bundle branch block ST depr, consider ischemia, anterolateral lds Baseline wander in lead(s) V3 Confirmed by HARRISON  MD, FORREST ([id number]) on 02/03/2013 6:30:06 PM            MDM   1. Flu-like symptoms   2. UTI symptoms   3. DOE (dyspnea on exertion)    UA not consistent with UTI.  EKG A. Fib with RBBB ST depression.  Troponin Positive.  CXR shows patchy interstitial density over mid/lower lungs, suspicious for  acute pneumonia.  No leukocytosis. Additional labs appears stable.  Patient afebrile and appears in NAD.   Patient started on IV antibiotic regimen for CAP.  Patient discussed with Dr. Hillery JacksForest Harrison. Plan to admit patient to Hospital.   Meds given in ED:  Medications  allopurinol (ZYLOPRIM) tablet 300 mg (300 mg Oral Given 02/05/13 1004)  nitroGLYCERIN (NITROSTAT) SL tablet 0.4 mg (not administered)  carvedilol (COREG) tablet 25 mg (25 mg Oral Given 02/05/13 0803)  ezetimibe-simvastatin (VYTORIN) 10-40 MG per tablet 1 tablet (1 tablet Oral Given 02/04/13 2202)  predniSONE (DELTASONE) tablet 2.5 mg (2.5 mg Oral Given 02/04/13 1015)  heparin injection 5,000 Units (5,000 Units Subcutaneous Given 02/05/13 1554)  0.9 %  sodium chloride infusion ( Intravenous New Bag/Given 02/04/13 1523)  cefTRIAXone (ROCEPHIN) 1 g in dextrose 5 % 50 mL IVPB (1 g Intravenous Given 02/04/13 2329)  sodium chloride 0.9 % injection 3 mL (3 mLs Intravenous Not Given 02/05/13 1005)  acetaminophen (TYLENOL) tablet 650 mg (not administered)    Or  acetaminophen (TYLENOL) suppository 650 mg (not administered)  docusate sodium (COLACE) capsule 100 mg (100 mg Oral Given 02/05/13 1004)  senna (SENOKOT) tablet 8.6 mg (8.6 mg Oral Given 02/05/13 1004)  bisacodyl (DULCOLAX) EC tablet 5 mg (not administered)  ondansetron (ZOFRAN) tablet 4 mg (not administered)    Or  ondansetron (ZOFRAN) injection 4 mg (not administered)  alum & mag hydroxide-simeth (MAALOX/MYLANTA) 200-200-20 MG/5ML suspension 30 mL (not administered)  aspirin EC tablet 81 mg (81 mg Oral Given 02/05/13 1004)  albuterol (PROVENTIL) (2.5 MG/3ML) 0.083% nebulizer solution 2.5 mg (not administered)  gabapentin (NEURONTIN) capsule 300 mg (300 mg Oral  Given 02/05/13 1004)  gabapentin (NEURONTIN) capsule 600 mg (600 mg Oral Given 02/04/13 2202)  tamsulosin (FLOMAX) capsule 0.4 mg (0.4 mg Oral Given 02/05/13 1555)  0.9 %  sodium chloride infusion ( Intravenous Rate/Dose  Change 02/05/13 1554)  azithromycin (ZITHROMAX) tablet 500 mg (not administered)  cefTRIAXone (ROCEPHIN) 1 g in dextrose 5 % 50 mL IVPB (0 g Intravenous Stopped 02/03/13 2015)  azithromycin (ZITHROMAX) 500 mg in dextrose 5 % 250 mL IVPB (500 mg Intravenous New Bag/Given 02/03/13 2035)  aspirin tablet 325 mg (325 mg Oral Given 02/03/13 2037)  oseltamivir (TAMIFLU) capsule 75 mg (75 mg Oral Given 02/03/13 2037)  LORazepam (ATIVAN) tablet 0.5 mg (0.5 mg Oral Given 02/04/13 0054)  potassium chloride SA (K-DUR,KLOR-CON) CR tablet 40 mEq (40 mEq Oral Given 02/04/13 1015)  sodium chloride 0.9 % bolus 250 mL (250 mLs Intravenous Given 02/04/13 1103)  potassium chloride SA (K-DUR,KLOR-CON) CR tablet 40 mEq (40 mEq Oral Given 02/04/13 1713)  zolpidem (AMBIEN) tablet 5 mg (5 mg Oral Given 02/04/13 2328)    Current Discharge Medication List               Rudene Anda, PA-C 02/05/13 1724

## 2013-02-03 NOTE — Consult Note (Signed)
Reason for Consult:Elevated troponin, [name] depression, pneumonia Referring Physician: [place]y  [name] is an 80 y.o. male.  HPI: [name] is a 80 year old male with a history of coronary artery disease s/p CABG. He has a LIMA to LAD, SVG to OM1&OM2 and SVG to Acute marginal. The vein graft to the OM system is known to be occluded. He also has significant peripheral vascular disease. He has had bilateral carotid endarterctomy, prior angioplasty of the left subclavian and known PAD in the renals. He comes to [facility] today with complaints of chills, cough productive of off-color sputum and shortness of breath. He tells me that this has been worsening for several days. He has not had any chest pain recently. Prior to this illness he was able to walk around at the store, could carry groceries if needed. He was completely independent. 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 He was started on empiric antibiotics. A troponin was checked and it was elevated. His ECG shows atrial fibrillation with RBBB and some [name] depression laterally.    Past Medical History  Diagnosis Date  . Coronary artery disease   . Stroke   . Benign prostatic hypertrophy   . Myocardial infarction   . Shortness of breath   . Dysrhythmia   . Heart murmur   . Colitis 12/16/2011  . Irregular heart beat     Past Surgical History  Procedure Laterality Date  . Appendectomy    . Coronary artery bypass graft    . Hemorrhoid surgery    . Knee arthroscopy    . Elbow surgery    . Cataract extraction      x2  . Eye surgery      Cataract  . Carotid endarterectomy Left 08-11-04    cea  . Carotid endarterectomy Right 10-08-04    cea    Family History  Problem Relation Age of Onset  . Stroke Father   . Heart attack Father   . Heart attack Mother   . Heart attack  Brother   . Peripheral vascular disease Brother     Social History:  reports that he quit smoking about 25 years ago. He has never used smokeless tobacco. He reports that he does not drink alcohol or use illicit drugs.    Medication List    ASK your doctor about these medications       allopurinol 300 MG tablet  Commonly known as:  ZYLOPRIM  Take 300 mg by mouth daily.     carvedilol 25 MG tablet  Commonly known as:  COREG  Take 25 mg by mouth 2 (two) times daily with a meal.     ezetimibe-simvastatin 10-40 MG per tablet  Commonly known as:  VYTORIN  Take 1 tablet by mouth at bedtime.     finasteride 5 MG tablet  Commonly known as:  PROSCAR  Take 5 mg by mouth daily.     furosemide 40 MG tablet  Commonly known as:  LASIX  Take 40 mg by mouth daily.     gabapentin 300 MG capsule  Commonly known as:  NEURONTIN  Take 300-600 mg by mouth 3 (three) times daily. Take 300 mg every morning & 600 mg at bedtime     isosorbide mononitrate 60 MG 24 hr tablet  Commonly  known as:  IMDUR  Take 60 mg by mouth daily.     lisinopril 20 MG tablet  Commonly known as:  PRINIVIL,ZESTRIL  Take 20 mg by mouth daily.     nitroGLYCERIN 0.4 MG SL tablet  Commonly known as:  NITROSTAT  Place 0.4 mg under the tongue every 5 (five) minutes as needed for chest pain.     predniSONE 5 MG tablet  Commonly known as:  DELTASONE  Take 2.5 mg by mouth every other day.     tamsulosin 0.4 MG Caps capsule  Commonly known as:  FLOMAX  Take 0.4 mg by mouth daily.        Allergies:  Allergies  Allergen Reactions  . Nsaids     REACTION: renal failure    Medications: I have reviewed the patient's current medications.  Results for orders placed during the hospital encounter of 02/03/13 (from the past 48 hour(s))  CBC WITH DIFFERENTIAL     Status: Abnormal   Collection Time    02/03/13  5:45 PM      Result Value Range   WBC 9.8  4.0 - 10.5 K/uL   RBC 4.04 (*) 4.22 - 5.81 MIL/uL   Hemoglobin  12.7 (*) 13.0 - 17.0 g/dL   HCT 37.8 (*) 39.0 - 52.0 %   MCV 93.6  78.0 - 100.0 fL   MCH 31.4  26.0 - 34.0 pg   MCHC 33.6  30.0 - 36.0 g/dL   RDW 15.5  11.5 - 15.5 %   Platelets 150  150 - 400 K/uL   Neutrophils Relative % 77  43 - 77 %   Neutro Abs 7.5  1.7 - 7.7 K/uL   Lymphocytes Relative 13  12 - 46 %   Lymphs Abs 1.3  0.7 - 4.0 K/uL   Monocytes Relative 10  3 - 12 %   Monocytes Absolute 0.9  0.1 - 1.0 K/uL   Eosinophils Relative 1  0 - 5 %   Eosinophils Absolute 0.1  0.0 - 0.7 K/uL   Basophils Relative 0  0 - 1 %   Basophils Absolute 0.0  0.0 - 0.1 K/uL  COMPREHENSIVE METABOLIC PANEL     Status: Abnormal   Collection Time    02/03/13  5:45 PM      Result Value Range   Sodium 140  137 - 147 mEq/L   Potassium 3.8  3.7 - 5.3 mEq/L   Chloride 102  96 - 112 mEq/L   CO2 25  19 - 32 mEq/L   Glucose, Bld 101 (*) 70 - 99 mg/dL   BUN 15  6 - 23 mg/dL   Creatinine, Ser 1.16  0.50 - 1.35 mg/dL   Calcium 8.0 (*) 8.4 - 10.5 mg/dL   Total Protein 6.4  6.0 - 8.3 g/dL   Albumin 3.0 (*) 3.5 - 5.2 g/dL   AST 33  0 - 37 U/L   ALT 37  0 - 53 U/L   Alkaline Phosphatase 92  39 - 117 U/L   Total Bilirubin 1.7 (*) 0.3 - 1.2 mg/dL   GFR calc non Af Amer 55 (*) >90 mL/min   GFR calc Af Amer 64 (*) >90 mL/min   Comment: (NOTE)     The eGFR has been calculated using the CKD EPI equation.     This calculation has not been validated in all clinical situations.     eGFR's persistently <90 mL/min signify possible Chronic Kidney     Disease.  POCT I-STAT TROPONIN I     Status: Abnormal   Collection Time    02/03/13  6:00 PM      Result Value Range   Troponin i, poc 0.36 (*) 0.00 - 0.08 ng/mL   Comment NOTIFIED PHYSICIAN     Comment 3            Comment: Due to the release kinetics of cTnI,     a negative result within the first hours     of the onset of symptoms does not rule out     myocardial infarction with certainty.     If myocardial infarction is still suspected,     repeat the test at  appropriate intervals.  URINALYSIS, ROUTINE W REFLEX MICROSCOPIC     Status: Abnormal   Collection Time    02/03/13  7:15 PM      Result Value Range   Color, Urine YELLOW  YELLOW   APPearance CLEAR  CLEAR   Specific Gravity, Urine 1.020  1.005 - 1.030   pH 7.0  5.0 - 8.0   Glucose, UA NEGATIVE  NEGATIVE mg/dL   Hgb urine dipstick SMALL (*) NEGATIVE   Bilirubin Urine NEGATIVE  NEGATIVE   Ketones, ur NEGATIVE  NEGATIVE mg/dL   Protein, ur 30 (*) NEGATIVE mg/dL   Urobilinogen, UA 1.0  0.0 - 1.0 mg/dL   Nitrite NEGATIVE  NEGATIVE   Leukocytes, UA NEGATIVE  NEGATIVE  URINE MICROSCOPIC-ADD ON     Status: Abnormal   Collection Time    02/03/13  7:15 PM      Result Value Range   Squamous Epithelial / LPF FEW (*) RARE   WBC, UA 0-2  <3 WBC/hpf   RBC / HPF 0-2  <3 RBC/hpf    Dg Chest 2 View  02/03/2013   CLINICAL DATA:  Generalized body aches, chills, cough and congestion with shortness of breath.  EXAM: CHEST  2 VIEW  COMPARISON:  12/15/2011 and CT 12/15/2011  FINDINGS: Lungs are adequately inflated with patchy mixed interstitial airspace density over the mid to lower lungs which may be due to pneumonia as there is likely a component of chronic basilar interstitial disease. There is stable cardiomegaly. Remainder the exam is unchanged.  IMPRESSION: Patchy mixed interstitial airspace density over the mid to lower lungs likely acute infection on a background of mild chronic interstitial disease.  Cardiomegaly.   Electronically Signed   By: Marin Olp M.D.   On: 02/03/2013 18:50   ECG- shows atrial fibrillation with RBBB and Twave depression in v5,v6. The [name] segment changes are new from prior.   ROS Complete ROS obtained and negative except as noted in HPI  Blood pressure 136/72, pulse 90, temperature 98.1 F (36.7 C), temperature source Oral, resp. rate 24, SpO2 96.00%. Physical Exam  Constitutional: He appears well-developed and well-nourished. No distress.  Cardiovascular: S1 normal.   An irregularly irregular rhythm present.  No extrasystoles are present.  Murmur heard.  Systolic murmur is present with a grade of 3/6  Pulses:      Radial pulses are 2+ on the right side, and 2+ on the left side.       Dorsalis pedis pulses are 1+ on the right side, and 1+ on the left side.       Posterior tibial pulses are 1+ on the right side, and 1+ on the left side.  Respiratory: He has rhonchi in the right middle field, the right lower field, the left middle field  and the left lower field.  Skin: He is not diaphoretic.    Assessment/Plan: [name] Eble is a pleasant 80 year old male with extensive cardiovascular history. He has prior bypass around 2006, as well as bilateral carotid revascularization and left subclavian stenting. He is here today with multifocal pneumonia. He hasn't had any anginal symptoms recently but his troponin is elevated and he has some [name] depression laterally. He does have an occluded OM1-OM2 graft, and that territory has not been revascularized. He has no signs of heart failure on exam. I think given the pneumonia, his age and known unrevascularized territory, the elevated troponin is a type 2 NSTEMI and I wouldn't systemically anticoagulate him at this point. He is not currently on an aspirin and given his carotid and coronary disease he should be on a daily $Remov'81mg'vQgORz$  aspirin. In addition to this he has episodes on the telemetry that appears to be asymptomatic paroxysmal atrial fibrillation. This would be a new diagnosis. We'll have to see what the telemetry shows overnight and try to get several ECGs to determine if he is actually having AF or just sinus rhythm with PACs and short atrial run.   NSTEMI -Likely type 2 -trend his troponin.  -would start aspirin $RemoveBefor'81mg'[id number]$  daily -continue coreg and imdur -tte  CAD -Start aspirin -continue imdur, coreg, vytorin  AF -telemetry  Hypertension -continue coreg, lisinopril   Pneumonia -per primary team   PAD -asymptomatic    Yelitza Reach C 02/03/2013, 8:46 PM

## 2013-02-03 NOTE — ED Notes (Signed)
Pt is still out of the dept at this time 

## 2013-02-03 NOTE — Discharge Instructions (Signed)
Follow up with your doctor on Monday for reevaluation. Return to ED should you experience worsening Shortness of breath, develop Chest pain, or new worsening symptoms.

## 2013-02-03 NOTE — ED Notes (Signed)
Pt states hes been feeling generalized body aches since Thursday, increasingly getting worse. States he gets increasingly SOB and winded with exertion. Pt in NAD at this time, AAOx4, no issues with breathing at thsi time.

## 2013-02-04 DIAGNOSIS — I4719 Other supraventricular tachycardia: Secondary | ICD-10-CM | POA: Diagnosis present

## 2013-02-04 DIAGNOSIS — I471 Supraventricular tachycardia: Secondary | ICD-10-CM | POA: Diagnosis present

## 2013-02-04 DIAGNOSIS — I491 Atrial premature depolarization: Secondary | ICD-10-CM | POA: Diagnosis present

## 2013-02-04 DIAGNOSIS — I4891 Unspecified atrial fibrillation: Secondary | ICD-10-CM

## 2013-02-04 DIAGNOSIS — I214 Non-ST elevation (NSTEMI) myocardial infarction: Secondary | ICD-10-CM

## 2013-02-04 DIAGNOSIS — R6889 Other general symptoms and signs: Secondary | ICD-10-CM

## 2013-02-04 DIAGNOSIS — I498 Other specified cardiac arrhythmias: Secondary | ICD-10-CM

## 2013-02-04 LAB — BASIC METABOLIC PANEL
BUN: 15 mg/dL (ref 6–23)
CALCIUM: 7.6 mg/dL — AB (ref 8.4–10.5)
CHLORIDE: 103 meq/L (ref 96–112)
CO2: 24 meq/L (ref 19–32)
CREATININE: 1.14 mg/dL (ref 0.50–1.35)
GFR calc Af Amer: 65 mL/min — ABNORMAL LOW (ref >90)
GFR calc non Af Amer: 56 mL/min — ABNORMAL LOW (ref >90)
GLUCOSE: 98 mg/dL (ref 70–99)
Potassium: 3.5 mEq/L — ABNORMAL LOW (ref 3.7–5.3)
Sodium: 140 mEq/L (ref 137–147)

## 2013-02-04 LAB — CBC
HCT: 37.4 % — ABNORMAL LOW (ref 39.0–52.0)
HEMATOCRIT: 32.9 % — AB (ref 39.0–52.0)
Hemoglobin: 12.7 g/dL — ABNORMAL LOW (ref 13.0–17.0)
Hemoglobin: 10.9 g/dL — ABNORMAL LOW (ref 13.0–17.0)
MCH: 31.8 pg (ref 26.0–34.0)
MCH: 31.3 pg (ref 26.0–34.0)
MCHC: 34 g/dL (ref 30.0–36.0)
MCHC: 33.1 g/dL (ref 30.0–36.0)
MCV: 93.7 fL (ref 78.0–100.0)
MCV: 94.5 fL (ref 78.0–100.0)
PLATELETS: 150 10*3/uL (ref 150–400)
PLATELETS: 134 10*3/uL — AB (ref 150–400)
RBC: 3.99 MIL/uL — ABNORMAL LOW (ref 4.22–5.81)
RBC: 3.48 MIL/uL — ABNORMAL LOW (ref 4.22–5.81)
RDW: 15.4 % (ref 11.5–15.5)
RDW: 15.5 % (ref 11.5–15.5)
WBC: 9.9 10*3/uL (ref 4.0–10.5)
WBC: 7.8 10*3/uL (ref 4.0–10.5)

## 2013-02-04 LAB — CREATININE, SERUM
Creatinine, Ser: 1.16 mg/dL (ref 0.50–1.35)
GFR calc Af Amer: 64 mL/min — ABNORMAL LOW (ref >90)
GFR calc non Af Amer: 55 mL/min — ABNORMAL LOW (ref >90)

## 2013-02-04 LAB — HIV ANTIBODY (ROUTINE TESTING W REFLEX): HIV: REACTIVE — AB

## 2013-02-04 LAB — LACTIC ACID, PLASMA: Lactic Acid, Venous: 1.7 mmol/L (ref 0.5–2.2)

## 2013-02-04 LAB — TROPONIN I
TROPONIN I: 0.4 ng/mL — AB (ref <0.30)
Troponin I: 0.59 ng/mL (ref <0.30)
Troponin I: 0.49 ng/mL (ref <0.30)

## 2013-02-04 MED ORDER — POTASSIUM CHLORIDE CRYS ER 20 MEQ PO TBCR
40.0000 meq | EXTENDED_RELEASE_TABLET | Freq: Once | ORAL | Status: AC
  Administered 2013-02-04: 40 meq via ORAL
  Filled 2013-02-04: qty 2

## 2013-02-04 MED ORDER — SODIUM CHLORIDE 0.9 % IV BOLUS (SEPSIS)
250.0000 mL | Freq: Once | INTRAVENOUS | Status: AC
  Administered 2013-02-04: 250 mL via INTRAVENOUS

## 2013-02-04 MED ORDER — LORAZEPAM 0.5 MG PO TABS: 0.5000 mg | ORAL_TABLET | Freq: Once | ORAL | Status: DC

## 2013-02-04 MED ORDER — LORAZEPAM 0.5 MG PO TABS
0.5000 mg | ORAL_TABLET | Freq: Once | ORAL | Status: AC
  Administered 2013-02-04: 0.5 mg via ORAL
  Filled 2013-02-04: qty 1

## 2013-02-04 MED ORDER — ZOLPIDEM TARTRATE 5 MG PO TABS
5.0000 mg | ORAL_TABLET | Freq: Once | ORAL | Status: AC
  Administered 2013-02-04: 5 mg via ORAL
  Filled 2013-02-04: qty 1

## 2013-02-04 NOTE — Progress Notes (Signed)
SUBJECTIVE:   Denies any chest pain or SOB  OBJECTIVE:   Vitals:   Filed Vitals:   02/03/13 2100 02/03/13 2155 02/04/13 0534 02/04/13 0805  BP: 142/67 121/69 123/68 138/89  Pulse: 89 80 75 117  Temp:  98.9 F (37.2 C) 98.5 F (36.9 C)   TempSrc:  Oral Oral   Resp:  16 16   Height:  6' (1.829 m)    Weight:  158 lb 8 oz (71.895 kg)    SpO2: 98% 95% 98% 99%   I&O's:   Intake/Output Summary (Last 24 hours) at 02/04/13 0943 Last data filed at 02/04/13 0800  Gross per 24 hour  Intake 533.75 ml  Output    300 ml  Net 233.75 ml   TELEMETRY: Reviewed telemetry pt in NSR:     PHYSICAL EXAM General: Well developed, well nourished, in no acute distress Head: Eyes PERRLA, No xanthomas.   Normal cephalic and atramatic  Lungs:   Clear bilaterally to auscultation and percussion. Heart:   HRRR S1 S2 Pulses are 2+ & equal.2/6 systolic murmur at RUSB to LLSB Abdomen: Bowel sounds are positive, abdomen soft and non-tender without masses  Extremities:   No clubbing, cyanosis or edema.  DP +1 Neuro: Alert and oriented X 3. Psych:  Good affect, responds appropriately   LABS: Basic Metabolic Panel:  Recent Labs  40/98/1101/24/15 1745 02/03/13 2323 02/04/13 0410  NA 140  --  140  K 3.8  --  3.5*  CL 102  --  103  CO2 25  --  24  GLUCOSE 101*  --  98  BUN 15  --  15  CREATININE 1.16 1.16 1.14  CALCIUM 8.0*  --  7.6*   Liver Function Tests:  Recent Labs  02/03/13 1745  AST 33  ALT 37  ALKPHOS 92  BILITOT 1.7*  PROT 6.4  ALBUMIN 3.0*   No results found for this basename: LIPASE, AMYLASE,  in the last 72 hours CBC:  Recent Labs  02/03/13 1745 02/03/13 2323 02/04/13 0410  WBC 9.8 9.9 7.8  NEUTROABS 7.5  --   --   HGB 12.7* 12.7* 10.9*  HCT 37.8* 37.4* 32.9*  MCV 93.6 93.7 94.5  PLT 150 150 134*   Cardiac Enzymes:  Recent Labs  02/03/13 2323 02/04/13 0410  TROPONINI 0.59* 0.49*   BNP: No components found with this basename: POCBNP,  D-Dimer: No results found  for this basename: DDIMER,  in the last 72 hours Hemoglobin A1C: No results found for this basename: HGBA1C,  in the last 72 hours Fasting Lipid Panel: No results found for this basename: CHOL, HDL, LDLCALC, TRIG, CHOLHDL, LDLDIRECT,  in the last 72 hours Thyroid Function Tests: No results found for this basename: TSH, T4TOTAL, FREET3, T3FREE, THYROIDAB,  in the last 72 hours Anemia Panel: No results found for this basename: VITAMINB12, FOLATE, FERRITIN, TIBC, IRON, RETICCTPCT,  in the last 72 hours Coag Panel:   Lab Results  Component Value Date   INR 1.01 10/10/2009    RADIOLOGY: Dg Chest 2 View  02/03/2013   CLINICAL DATA:  Generalized body aches, chills, cough and congestion with shortness of breath.  EXAM: CHEST  2 VIEW  COMPARISON:  12/15/2011 and CT 12/15/2011  FINDINGS: Lungs are adequately inflated with patchy mixed interstitial airspace density over the mid to lower lungs which may be due to pneumonia as there is likely a component of chronic basilar interstitial disease. There is stable cardiomegaly. Remainder the exam is unchanged.  IMPRESSION: Patchy mixed interstitial airspace density over the mid to lower lungs likely acute infection on a background of mild chronic interstitial disease.  Cardiomegaly.   Electronically Signed   By: Elberta Fortis M.D.   On: 02/03/2013 18:50   Assessment/Plan:  Mr Ritchie is a pleasant 80 year old male with extensive cardiovascular history. He has prior bypass around 2006, as well as bilateral carotid revascularization and left subclavian stenting. He is here  with multifocal pneumonia. He hasn't had any anginal symptoms recently but his troponin is elevated and he has some ST depression laterally. He does have an occluded OM1-OM2 graft, and that territory has not been revascularized. He has no signs of heart failure on exam. I think given the pneumonia, his age and known unrevascularized territory, the elevated troponin is a type 2 NSTEMI and I wouldn't  systemically anticoagulate him at this point. He is not currently on an aspirin and given his carotid and coronary disease he should be on a daily 81mg  aspirin. In addition to this he has episodes on the telemetry that appears to be asymptomatic paroxysmal atrial fibrillation. This would be a new diagnosis. We'll have to see what the telemetry shows overnight and try to get several ECGs to determine if he is actually having AF or just sinus rhythm with PACs and short atrial run.  1.  NSTEMI  -Likely type 2 from demand ischemia with PNA in the setting of non revascularized occluded graft to OM1-OM2 - enzymes have peaked -continue coreg/ASA/statin and imdur  - check 2D echo to reassess LVF - EF in 2006 was 40-50% 2.  CAD  -continue nitrates/beta blocker/statin/ASA 3.  AF  -telemetry reviewed and appears that rhythms are all NSR with very frequent PAC's and nonsustained atrial tachycardia.  Initial tele strips from admit have a lot of baseline artifact making interpretation difficult.  Will continue to monitor 4.  Hypertension - controlled -continue coreg, lisinopril  5.  Pneumonia  -per primary team  6.  PAD  -asymptomatic  7.  Heart mumur - check echo 8.  Hypokalemia - replete       Quintella Reichert, MD  02/04/2013  9:43 AM

## 2013-02-04 NOTE — Progress Notes (Signed)
[facility] PROGRESS NOTE  Assunta CurtisBob E Mclane ZOX:[id number] DOB: 03/13/1924 DOA: 02/03/2013 PCP: Judie PetitSWORDS,BRUCE HENRY, MD  Assessment/Plan: 1.Pneumonia; Continue with Ceftriaxone and Azithromycin day 2. Continue with Tamiflu. Influenza panel pending. Strept Pneumonia and legionella antigen pending.   2.NSTEMI; troponin increase at 0.40 ; holder parameters for Coreg due to hypotension. Continue with aspirin, Vytorin.   3. Hypertension. Will hold BP medications, SBP low.   4. Hyperlipidemia. Continue with Vytorin.   5. Gout. Continue with allopurinol.   6. BPH. Will hold  finasteride and Flomax due to hypotension.   7. DVT Prophylaxis Heparin -  8-Hypotension; hold multiple BP medications, Imdur, lasix, lisinopril, Flomax. IV bolus as needed. ECHO ordered. Lactic acid normal at 1.7. BP improved after 250 cc bolus. Might need stress dose steroids. Will check cortisol level.   9.Hypokalemia; replete with 40 meq KCl.   Code Status: Full Code.  Family Communication: care discussed with patient.  Disposition Plan: remain in patient.     Consultants:  Cardiology  Procedures:  ECHO;   Antibiotics:  Ceftriaxone 1-24  Azithromycin 1-24  HPI/Subjective: Feeling better today. No dyspnea. No chest pain. He was living by himself.   Objective: Filed Vitals:   02/04/13 1159  BP: 102/58  Pulse:   Temp:   Resp:     Intake/Output Summary (Last 24 hours) at 02/04/13 1348 Last data filed at 02/04/13 0800  Gross per 24 hour  Intake 533.75 ml  Output    300 ml  Net 233.75 ml   Filed Weights   02/03/13 2155  Weight: 71.895 kg (158 lb 8 oz)    Exam:   General:  No distress.    Cardiovascular: S 1, S 2 RRR  Respiratory: CTA  Abdomen: Bs present, soft, NT  Musculoskeletal: no edema.    Data Reviewed: Basic Metabolic Panel:  Recent Labs Lab 02/03/13 1745 02/03/13 2323 02/04/13 0410  NA 140  --  140  K 3.8  --  3.5*  CL 102  --  103  CO2 25  --  24  GLUCOSE  101*  --  98  BUN 15  --  15  CREATININE 1.16 1.16 1.14  CALCIUM 8.0*  --  7.6*   Liver Function Tests:  Recent Labs Lab 02/03/13 1745  AST 33  ALT 37  ALKPHOS 92  BILITOT 1.7*  PROT 6.4  ALBUMIN 3.0*   No results found for this basename: LIPASE, AMYLASE,  in the last 168 hours No results found for this basename: AMMONIA,  in the last 168 hours CBC:  Recent Labs Lab 02/03/13 1745 02/03/13 2323 02/04/13 0410  WBC 9.8 9.9 7.8  NEUTROABS 7.5  --   --   HGB 12.7* 12.7* 10.9*  HCT 37.8* 37.4* 32.9*  MCV 93.6 93.7 94.5  PLT 150 150 134*   Cardiac Enzymes:  Recent Labs Lab 02/03/13 2323 02/04/13 0410 02/04/13 1115  TROPONINI 0.59* 0.49* 0.40*   BNP (last 3 results) No results found for this basename: PROBNP,  in the last 8760 hours CBG: No results found for this basename: GLUCAP,  in the last 168 hours  No results found for this or any previous visit (from the past 240 hour(s)).   Studies: Dg Chest 2 View  02/03/2013   CLINICAL DATA:  Generalized body aches, chills, cough and congestion with shortness of breath.  EXAM: CHEST  2 VIEW  COMPARISON:  12/15/2011 and CT 12/15/2011  FINDINGS: Lungs are adequately inflated with patchy mixed interstitial airspace density over  the mid to lower lungs which may be due to pneumonia as there is likely a component of chronic basilar interstitial disease. There is stable cardiomegaly. Remainder the exam is unchanged.  IMPRESSION: Patchy mixed interstitial airspace density over the mid to lower lungs likely acute infection on a background of mild chronic interstitial disease.  Cardiomegaly.   Electronically Signed   By: Elberta Fortis M.D.   On: 02/03/2013 18:50    Scheduled Meds: . allopurinol  300 mg Oral Daily  . aspirin EC  81 mg Oral Daily  . azithromycin  500 mg Intravenous Q24H  . carvedilol  25 mg Oral BID WC  . cefTRIAXone (ROCEPHIN)  IV  1 g Intravenous Q24H  . docusate sodium  100 mg Oral BID  . ezetimibe-simvastatin  1  tablet Oral QHS  . gabapentin  300 mg Oral Daily  . gabapentin  600 mg Oral QHS  . heparin  5,000 Units Subcutaneous Q8H  . predniSONE  2.5 mg Oral QODAY  . senna  1 tablet Oral BID  . sodium chloride  3 mL Intravenous Q12H   Continuous Infusions: . sodium chloride 75 mL/hr at 02/03/13 2307    Principal Problem:   CAP (community acquired pneumonia) Active Problems:   HYPERLIPIDEMIA   GOUT   HYPERTENSION   CORONARY ARTERY DISEASE   BPH (benign prostatic hyperplasia)   Elevated troponin   PAC (premature atrial contraction)   Atrial tachycardia    Time spent: 35 minutes.     Camella Seim  [facility] Pager [phone]. If 7PM-7AM, please contact night-coverage at [email or web address], password [facility] 02/04/2013, 1:48 PM  LOS: 1 day

## 2013-02-05 DIAGNOSIS — I359 Nonrheumatic aortic valve disorder, unspecified: Secondary | ICD-10-CM

## 2013-02-05 DIAGNOSIS — I214 Non-ST elevation (NSTEMI) myocardial infarction: Secondary | ICD-10-CM

## 2013-02-05 LAB — BASIC METABOLIC PANEL
BUN: 19 mg/dL (ref 6–23)
CALCIUM: 7.9 mg/dL — AB (ref 8.4–10.5)
CHLORIDE: 104 meq/L (ref 96–112)
CO2: 21 meq/L (ref 19–32)
Creatinine, Ser: 1.43 mg/dL — ABNORMAL HIGH (ref 0.50–1.35)
GFR calc Af Amer: 50 mL/min — ABNORMAL LOW (ref >90)
GFR calc non Af Amer: 43 mL/min — ABNORMAL LOW (ref >90)
GLUCOSE: 105 mg/dL — AB (ref 70–99)
Potassium: 5.1 mEq/L (ref 3.7–5.3)
Sodium: 139 mEq/L (ref 137–147)

## 2013-02-05 LAB — CORTISOL: Cortisol, Plasma: 13 ug/dL

## 2013-02-05 LAB — URINE CULTURE

## 2013-02-05 LAB — CBC
HCT: 33.8 % — ABNORMAL LOW (ref 39.0–52.0)
HEMOGLOBIN: 11.1 g/dL — AB (ref 13.0–17.0)
MCH: 31.4 pg (ref 26.0–34.0)
MCHC: 32.8 g/dL (ref 30.0–36.0)
MCV: 95.8 fL (ref 78.0–100.0)
PLATELETS: 123 10*3/uL — AB (ref 150–400)
RBC: 3.53 MIL/uL — AB (ref 4.22–5.81)
RDW: 15.7 % — ABNORMAL HIGH (ref 11.5–15.5)
WBC: 9.3 10*3/uL (ref 4.0–10.5)

## 2013-02-05 LAB — RESPIRATORY VIRUS PANEL
Adenovirus: NOT DETECTED
INFLUENZA A: NOT DETECTED
INFLUENZA B 1: NOT DETECTED
Influenza A H1: NOT DETECTED
Influenza A H3: NOT DETECTED
Metapneumovirus: NOT DETECTED
Parainfluenza 1: NOT DETECTED
Parainfluenza 2: NOT DETECTED
Parainfluenza 3: NOT DETECTED
Respiratory Syncytial Virus A: NOT DETECTED
Respiratory Syncytial Virus B: NOT DETECTED
Rhinovirus: NOT DETECTED

## 2013-02-05 LAB — INFLUENZA PANEL BY PCR (TYPE A & B)
H1N1FLUPCR: NOT DETECTED
Influenza A By PCR: NEGATIVE
Influenza B By PCR: NEGATIVE

## 2013-02-05 LAB — STREP PNEUMONIAE URINARY ANTIGEN: Strep Pneumo Urinary Antigen: NEGATIVE

## 2013-02-05 MED ORDER — ZOLPIDEM TARTRATE 5 MG PO TABS
5.0000 mg | ORAL_TABLET | Freq: Every evening | ORAL | Status: DC | PRN
  Administered 2013-02-05: 5 mg via ORAL
  Filled 2013-02-05: qty 1

## 2013-02-05 MED ORDER — TAMSULOSIN HCL 0.4 MG PO CAPS
0.4000 mg | ORAL_CAPSULE | Freq: Every day | ORAL | Status: DC
  Administered 2013-02-05 – 2013-02-06 (×2): 0.4 mg via ORAL
  Filled 2013-02-05 (×2): qty 1

## 2013-02-05 MED ORDER — AZITHROMYCIN 500 MG PO TABS
500.0000 mg | ORAL_TABLET | Freq: Every day | ORAL | Status: DC
  Administered 2013-02-05: 500 mg via ORAL
  Filled 2013-02-05 (×2): qty 1

## 2013-02-05 MED ORDER — SODIUM CHLORIDE 0.9 % IV SOLN: INTRAVENOUS | Status: DC

## 2013-02-05 NOTE — Progress Notes (Signed)
Subjective: He  Feels great.  Objective: Vital signs in last 24 hours: Temp:  [98.1 F (36.7 C)-99.4 F (37.4 C)] 98.1 F (36.7 C) (01/26 0536) Pulse Rate:  [57-107] 68 (01/26 0536) Resp:  [16] 16 (01/26 0536) BP: (87-125)/(32-61) 125/61 mmHg (01/26 0536) SpO2:  [94 %-100 %] 100 % (01/26 0536) Last BM Date: 02/03/13  Intake/Output from previous day: 01/25 0701 - 01/26 0700 In: 720 [P.O.:720] Out: 600 [Urine:600] Intake/Output this shift:    Medications Current Facility-Administered Medications  Medication Dose Route Frequency Provider Last Rate Last Dose  . acetaminophen (TYLENOL) tablet 650 mg  650 mg Oral Q6H PRN Huey Bienenstock, MD       Or  . acetaminophen (TYLENOL) suppository 650 mg  650 mg Rectal Q6H PRN Huey Bienenstock, MD      . albuterol (PROVENTIL) (2.5 MG/3ML) 0.083% nebulizer solution 2.5 mg  2.5 mg Nebulization Q2H PRN Huey Bienenstock, MD      . allopurinol (ZYLOPRIM) tablet 300 mg  300 mg Oral Daily Huey Bienenstock, MD   300 mg at 02/04/13 1015  . alum & mag hydroxide-simeth (MAALOX/MYLANTA) 200-200-20 MG/5ML suspension 30 mL  30 mL Oral Q6H PRN Huey Bienenstock, MD      . aspirin EC tablet 81 mg  81 mg Oral Daily Huey Bienenstock, MD   81 mg at 02/04/13 1015  . azithromycin (ZITHROMAX) 500 mg in dextrose 5 % 250 mL IVPB  500 mg Intravenous Q24H Huey Bienenstock, MD   500 mg at 02/05/13 0008  . bisacodyl (DULCOLAX) EC tablet 5 mg  5 mg Oral Daily PRN Huey Bienenstock, MD      . carvedilol (COREG) tablet 25 mg  25 mg Oral BID WC Belkys A Regalado, MD   25 mg at 02/05/13 0803  . cefTRIAXone (ROCEPHIN) 1 g in dextrose 5 % 50 mL IVPB  1 g Intravenous Q24H Huey Bienenstock, MD   1 g at 02/04/13 2329  . docusate sodium (COLACE) capsule 100 mg  100 mg Oral BID Huey Bienenstock, MD   100 mg at 02/04/13 2202  . ezetimibe-simvastatin (VYTORIN) 10-40 MG per tablet 1 tablet  1 tablet Oral QHS Huey Bienenstock, MD   1 tablet at 02/04/13 2202  . gabapentin (NEURONTIN)  capsule 300 mg  300 mg Oral Daily Abran Duke, RPH   300 mg at 02/04/13 1015  . gabapentin (NEURONTIN) capsule 600 mg  600 mg Oral QHS Abran Duke, RPH   600 mg at 02/04/13 2202  . heparin injection 5,000 Units  5,000 Units Subcutaneous Q8H Huey Bienenstock, MD   5,000 Units at 02/05/13 9604  . nitroGLYCERIN (NITROSTAT) SL tablet 0.4 mg  0.4 mg Sublingual Q5 min PRN Huey Bienenstock, MD      . ondansetron (ZOFRAN) tablet 4 mg  4 mg Oral Q6H PRN Huey Bienenstock, MD       Or  . ondansetron (ZOFRAN) injection 4 mg  4 mg Intravenous Q6H PRN Dawood Elgergawy, MD      . predniSONE (DELTASONE) tablet 2.5 mg  2.5 mg Oral QODAY Dawood Elgergawy, MD   2.5 mg at 02/04/13 1015  . senna (SENOKOT) tablet 8.6 mg  1 tablet Oral BID Huey Bienenstock, MD   8.6 mg at 02/04/13 2202  . sodium chloride 0.9 % injection 3 mL  3 mL Intravenous Q12H Huey Bienenstock, MD   3 mL at 02/03/13 2357    PE: General appearance: alert, cooperative and no distress Lungs: clear to auscultation bilaterally Heart: regular  rate and rhythm and 2/6 harsh sounding sys MM.  LSB. Extremities: No LEE Pulses: 1+ pulses. Skin: Warm and dry. Neurologic: Grossly normal  Echo 02/05/13: Left ventricle: LVEF is approximately 40 to 45% with akinesis of the base/mid inferior and posterior walls The cavity size was mildly dilated. Wall thickness was increased in a pattern of mild LVH. Doppler parameters are consistent with abnormal left ventricular relaxation (grade 1 diastolic dysfunction). - Aortic valve: AV is thickened, calcified with restricted motion. Peak and mean gradients through the valve are 67 and 40 mm HG respectively consistent with severe AS> Mild to moderate regurgitation. - Mitral valve: Calcified annulus. Mildly thickened leaflets . Mild regurgitation. - Left atrium: The atrium was moderately to severely dilated. - Right ventricle: The cavity size was mildly dilated. Systolic function was mildly to moderately  reduced. - Tricuspid valve: Mild-moderate regurgitation. - Pulmonary arteries: PA peak pressure: 51mm Hg (S).    Lab Results:   Recent Labs  02/03/13 2323 02/04/13 0410 02/05/13 0444  WBC 9.9 7.8 9.3  HGB 12.7* 10.9* 11.1*  HCT 37.4* 32.9* 33.8*  PLT 150 134* 123*   BMET  Recent Labs  02/03/13 1745 02/03/13 2323 02/04/13 0410 02/05/13 0444  NA 140  --  140 139  K 3.8  --  3.5* 5.1  CL 102  --  103 104  CO2 25  --  24 21  GLUCOSE 101*  --  98 105*  BUN 15  --  15 19  CREATININE 1.16 1.16 1.14 1.43*  CALCIUM 8.0*  --  7.6* 7.9*   Cardiac Panel (last 3 results)  Recent Labs  02/03/13 2323 02/04/13 0410 02/04/13 1115  TROPONINI 0.59* 0.49* 0.40*    Assessment/Plan   Principal Problem:   CAP (community acquired pneumonia) Active Problems:   HYPERLIPIDEMIA   GOUT   HYPERTENSION   CORONARY ARTERY DISEASE   BPH (benign prostatic hyperplasia)   Elevated troponin   PAC (premature atrial contraction)   Atrial tachycardia   CABG 2006   Aortic stenosis  Plan:   He feels great. Type II MI from demand ischemia in the setting of PNA.  Peak troponin 0.59.  TTE pending to evaluate AS.  BP and HR well controlled.  He feels great.  SCr has jumped up.  My need some fluids.  ASA 81, Coreg 25.    LOS: 2 days   HAGER, BRYAN 02/05/2013 9:33 AM  I have personally seen and examined this patient with Wilburt FinlayBryan Hager, PA-C. I agree with the assessment and plan as outlined above.  1. NSTEMI: Type II in setting of pneumonia. He is known to have CAD with prior CABG 2. Aortic stenosis: Severe by echo today with mean gradient of 40mmHg. He is asymptomatic at this time. He would be a high risk candidate for redo open chest procedure. He would be a possible candidate for TAVR in the future if he becomes symptomatic with his AS. Will follow as an outpatient 3. Ischemic cardiomyopathy: LVEF=40-45%. Continue beta blocker. No ace-inh or ARB with renal insufficiency. Consider if renal  function normalizes.   He could likely be discharged home tomorrow and f/u in our office with Armanda Magicraci Turner.   MCALHANY,CHRISTOPHER 02/05/2013 2:53 PM

## 2013-02-05 NOTE — Evaluation (Signed)
Physical Therapy Evaluation Patient Details Name: [name] MRN: [id number] DOB: 04/27/1924 Today's Date: 02/05/2013 Time: [id number] PT Time Calculation (min): 30 min  PT Assessment / Plan / Recommendation History of Present Illness  [name]  is a 80 y.o. male, who presents from home for multiple complaints, mainly shortness of breath, cough with productive green sputum for the last 2 days, generalized body ache, and weakness, upon presentation patient was a febrile, did not have any hypoxia or shortness of breath, but his chest x-ray did show bilateral lung capacity, the patient was started on IV Rocephin and Zithromax for healthcare acquired pneumonia, as well he was started on Tamiflu empirically until his influenza results are back, patient was noticed to have elevated troponin at 0.36, patient denies any chest pain, patient EKG did show a right bundle branch block which appears to be old from previous EKG, but he did have mild ST depression in the anterior lateral leads, patient reports coronary artery disease and history of CABG in the past, the patient was given 325 mg of aspirin in ED.  Clinical Impression  Pt with generalized deconditioning and functioning at supervision level with use of RW. Pt requires 24/7 supervision for safe d/c home. Pt reports daughter can stay with him as long as he needs. Pt has RW at home.     PT Assessment  Patient needs continued PT services    Follow Up Recommendations  No PT follow up;Supervision/Assistance - 24 hour    Does the patient have the potential to tolerate intense rehabilitation      Barriers to Discharge Decreased caregiver support reports dtr can stay with him for a little while    Equipment Recommendations   (pt has RW)    Recommendations for Other Services     Frequency Min 3X/week    Precautions / Restrictions Precautions Precautions: Fall Restrictions Weight Bearing Restrictions: No   Pertinent Vitals/Pain + SOB with  ambulation but recovery s/p 1 min of rest      Mobility  Bed Mobility Overal bed mobility: Independent Transfers Overall transfer level: Needs assistance Transfers: Sit to/from Stand Sit to Stand: Supervision General transfer comment: pt with safe technique Ambulation/Gait Ambulation/Gait assistance: Min guard Ambulation Distance (Feet): 120 Feet Assistive device: Rolling walker (2 wheeled) Gait Pattern/deviations: Step-through pattern;Decreased stride length General Gait Details: . pt + SOB during amb and reports "that made me tired." attempted ambulation without walker pt was unsteady and required minA to be safe. pt min guard with RW and more steady    Exercises     PT Diagnosis: Generalized weakness  PT Problem List: Decreased strength;Decreased activity tolerance;Decreased balance;Decreased mobility PT Treatment Interventions: DME instruction;Gait training;Functional mobility training;Therapeutic activities;Therapeutic exercise     PT Goals(Current goals can be found in the care plan section) Acute Rehab PT Goals Patient Stated Goal: home PT Goal Formulation: With patient Time For Goal Achievement: 02/12/13 Potential to Achieve Goals: Good Additional Goals Additional Goal #1: Pt to achieve >19 on DGI.  Visit Information  Last PT Received On: 02/05/13 Assistance Needed: +1 History of Present Illness: [name]  is a 80 y.o. male, who presents from home for multiple complaints, mainly shortness of breath, cough with productive green sputum for the last 2 days, generalized body ache, and weakness, upon presentation patient was a febrile, did not have any hypoxia or shortness of breath, but his chest x-ray did show bilateral lung capacity, the patient was started on IV Rocephin and Zithromax for healthcare  acquired pneumonia, as well he was started on Tamiflu empirically until his influenza results are back, patient was noticed to have elevated troponin at 0.36, patient  denies any chest pain, patient EKG did show a right bundle branch block which appears to be old from previous EKG, but he did have mild ST depression in the anterior lateral leads, patient reports coronary artery disease and history of CABG in the past, the patient was given 325 mg of aspirin in ED.       Prior Functioning  Home Living Family/patient expects to be discharged to:: Private residence Living Arrangements: Alone Available Help at Discharge: Family;Available 24 hours/day Type of Home: House Home Access: Level entry Home Layout: One level Home Equipment: Walker - 2 wheels;Shower seat Additional Comments: pt reports daughter to stay  with him Prior Function Level of Independence: Independent Communication Communication: No difficulties Dominant Hand: Right    Cognition  Cognition Arousal/Alertness: Awake/alert Behavior During Therapy: WFL for tasks assessed/performed Overall Cognitive Status: Within Functional Limits for tasks assessed    Extremity/Trunk Assessment Upper Extremity Assessment Upper Extremity Assessment: Generalized weakness Lower Extremity Assessment Lower Extremity Assessment: Generalized weakness Cervical / Trunk Assessment Cervical / Trunk Assessment: Kyphotic   Balance Balance Overall balance assessment: Needs assistance Sitting-balance support: Feet supported;No upper extremity supported Sitting balance-Leahy Scale: Good Sitting balance - Comments: pt sat EOB x 15 min Standing balance support: No upper extremity supported Standing balance-Leahy Scale: Fair Standing balance comment: pt able to wash hand without LOB  End of Session PT - End of Session Equipment Utilized During Treatment: Gait belt Activity Tolerance: Patient tolerated treatment well Patient left: in bed;with call bell/phone within reach Nurse Communication: Mobility status  GP     Marcene Brawn 02/05/2013, 4:59 PM  Lewis Shock, PT, DPT Pager #: [phone] Office  #: [phone]

## 2013-02-05 NOTE — Care Management Note (Unsigned)
    Page 1 of 1   02/05/2013     2:49:06 PM   CARE MANAGEMENT NOTE 02/05/2013  Patient:  [name],[name]   Account Number:  [id number]  Date Initiated:  02/05/2013  Documentation initiated by:  GRAVES-BIGELOW,Luxe Cuadros  Subjective/Objective Assessment:   Pt admitted for PNA. Initiated on IV ABX.     Action/Plan:   CM to continue to monitor fordisposition needs.   Anticipated DC Date:  02/07/2013   Anticipated DC Plan:  SKILLED NURSING FACILITY      DC Planning Services  CM consult      Choice offered to / List presented to:             Status of service:  In process, will continue to follow Medicare Important Message given?   (If response is "NO", the following Medicare IM given date fields will be blank) Date Medicare IM given:   Date Additional Medicare IM given:    Discharge Disposition:    Per UR Regulation:  Reviewed for med. necessity/level of care/duration of stay  If discussed at Long Length of Stay Meetings, dates discussed:    Comments:

## 2013-02-05 NOTE — Progress Notes (Signed)
[facility] PROGRESS NOTE  Assunta CurtisBob E Haberle WUJ:[id number] DOB: 09/08/1924 DOA: 02/03/2013 PCP: Judie PetitSWORDS,BRUCE HENRY, MD  Assessment/Plan: 1.Pneumonia; Continue with Ceftriaxone and Azithromycin day 3. Continue with Tamiflu. Influenza panel pending. Strept Pneumonia and legionella antigen pending.   2.NSTEMI; troponin increase at 0.40 ; holder parameters for Coreg due to hypotension. Continue with aspirin, Vytorin.   3. Hypertension. Will hold BP medications.   4. Hyperlipidemia. Continue with Vytorin.   5. Gout. Continue with allopurinol.   6. BPH. Will hold  Finasteride. Will resume Flomax.   7. DVT Prophylaxis Heparin -  8-Hypotension; hold multiple BP medications, Imdur, lasix, lisinopril. IV bolus as needed. ECHO ordered. Lactic acid normal at 1.7. BP improved after 250 cc bolus. Cortisol at 13. BP increasing.   9.Hypokalemia; resolved.  10-Acute Kidney Injury; mild increase Cr at 1.4. In setting hypotension, infection. IV fluids.  11-HIV reactive. ID will follow. Viral load pending. ? False positive ??  Code Status: Full Code.  Family Communication: care discussed with patient.  Disposition Plan: remain in patient.     Consultants:  Cardiology  Procedures:  ECHO;   Antibiotics:  Ceftriaxone 1-24  Azithromycin 1-24  HPI/Subjective: Feeling better today. No dyspnea. No chest pain. Wants to go home.   Objective: Filed Vitals:   02/05/13 0536  BP: 125/61  Pulse: 68  Temp: 98.1 F (36.7 C)  Resp: 16    Intake/Output Summary (Last 24 hours) at 02/05/13 1258 Last data filed at 02/05/13 0700  Gross per 24 hour  Intake    720 ml  Output    250 ml  Net    470 ml   Filed Weights   02/03/13 2155  Weight: 71.895 kg (158 lb 8 oz)    Exam:   General:  No distress.    Cardiovascular: S 1, S 2 RRR  Respiratory: CTA  Abdomen: Bs present, soft, NT  Musculoskeletal: no edema.    Data Reviewed: Basic Metabolic Panel:  Recent Labs Lab  02/03/13 1745 02/03/13 2323 02/04/13 0410 02/05/13 0444  NA 140  --  140 139  K 3.8  --  3.5* 5.1  CL 102  --  103 104  CO2 25  --  24 21  GLUCOSE 101*  --  98 105*  BUN 15  --  15 19  CREATININE 1.16 1.16 1.14 1.43*  CALCIUM 8.0*  --  7.6* 7.9*   Liver Function Tests:  Recent Labs Lab 02/03/13 1745  AST 33  ALT 37  ALKPHOS 92  BILITOT 1.7*  PROT 6.4  ALBUMIN 3.0*   No results found for this basename: LIPASE, AMYLASE,  in the last 168 hours No results found for this basename: AMMONIA,  in the last 168 hours CBC:  Recent Labs Lab 02/03/13 1745 02/03/13 2323 02/04/13 0410 02/05/13 0444  WBC 9.8 9.9 7.8 9.3  NEUTROABS 7.5  --   --   --   HGB 12.7* 12.7* 10.9* 11.1*  HCT 37.8* 37.4* 32.9* 33.8*  MCV 93.6 93.7 94.5 95.8  PLT 150 150 134* 123*   Cardiac Enzymes:  Recent Labs Lab 02/03/13 2323 02/04/13 0410 02/04/13 1115  TROPONINI 0.59* 0.49* 0.40*   BNP (last 3 results) No results found for this basename: PROBNP,  in the last 8760 hours CBG: No results found for this basename: GLUCAP,  in the last 168 hours  Recent Results (from the past 240 hour(s))  URINE CULTURE     Status: None   Collection Time  02/03/13  7:15 PM      Result Value Range Status   Specimen Description URINE, CLEAN CATCH   Final   Special Requests NONE   Final   Culture  Setup Time     Final   Value: 02/04/2013 05:47     Performed at [facility] Count     Final   Value: 75,000 COLONIES/ML     Performed at [facility]   Culture     Final   Value: Multiple bacterial morphotypes present, none predominant. Suggest appropriate recollection if clinically indicated.     Performed at [facility]   Report Status 02/05/2013 FINAL   Final     Studies: Dg Chest 2 View  02/03/2013   CLINICAL DATA:  Generalized body aches, chills, cough and congestion with shortness of breath.  EXAM: CHEST  2 VIEW  COMPARISON:  12/15/2011 and CT 12/15/2011  FINDINGS:  Lungs are adequately inflated with patchy mixed interstitial airspace density over the mid to lower lungs which may be due to pneumonia as there is likely a component of chronic basilar interstitial disease. There is stable cardiomegaly. Remainder the exam is unchanged.  IMPRESSION: Patchy mixed interstitial airspace density over the mid to lower lungs likely acute infection on a background of mild chronic interstitial disease.  Cardiomegaly.   Electronically Signed   By: Elberta Fortis M.D.   On: 02/03/2013 18:50    Scheduled Meds: . allopurinol  300 mg Oral Daily  . aspirin EC  81 mg Oral Daily  . azithromycin  500 mg Intravenous Q24H  . carvedilol  25 mg Oral BID WC  . cefTRIAXone (ROCEPHIN)  IV  1 g Intravenous Q24H  . docusate sodium  100 mg Oral BID  . ezetimibe-simvastatin  1 tablet Oral QHS  . gabapentin  300 mg Oral Daily  . gabapentin  600 mg Oral QHS  . heparin  5,000 Units Subcutaneous Q8H  . predniSONE  2.5 mg Oral QODAY  . senna  1 tablet Oral BID  . sodium chloride  3 mL Intravenous Q12H   Continuous Infusions:    Principal Problem:   CAP (community acquired pneumonia) Active Problems:   HYPERLIPIDEMIA   GOUT   HYPERTENSION   CORONARY ARTERY DISEASE   BPH (benign prostatic hyperplasia)   Elevated troponin   PAC (premature atrial contraction)   Atrial tachycardia    Time spent: 30 minutes.     REGALADO,BELKYS  [facility] Pager [phone]. If 7PM-7AM, please contact night-coverage at [email or web address], password [facility] 02/05/2013, 12:58 PM  LOS: 2 days

## 2013-02-05 NOTE — Progress Notes (Signed)
  Echocardiogram 2D Echocardiogram has been performed.  Arvil ChacoFoster, Rosalinda Seaman 02/05/2013, 11:59 AM

## 2013-02-05 NOTE — Progress Notes (Signed)
UR Completed Marriana Hibberd Graves-Bigelow, RN,BSN [phone]  

## 2013-02-06 DIAGNOSIS — I2589 Other forms of chronic ischemic heart disease: Secondary | ICD-10-CM

## 2013-02-06 DIAGNOSIS — I350 Nonrheumatic aortic (valve) stenosis: Secondary | ICD-10-CM | POA: Diagnosis present

## 2013-02-06 DIAGNOSIS — J111 Influenza due to unidentified influenza virus with other respiratory manifestations: Secondary | ICD-10-CM

## 2013-02-06 DIAGNOSIS — I359 Nonrheumatic aortic valve disorder, unspecified: Secondary | ICD-10-CM

## 2013-02-06 DIAGNOSIS — I255 Ischemic cardiomyopathy: Secondary | ICD-10-CM | POA: Diagnosis present

## 2013-02-06 DIAGNOSIS — I214 Non-ST elevation (NSTEMI) myocardial infarction: Secondary | ICD-10-CM | POA: Diagnosis present

## 2013-02-06 DIAGNOSIS — R6889 Other general symptoms and signs: Secondary | ICD-10-CM | POA: Diagnosis present

## 2013-02-06 DIAGNOSIS — R75 Inconclusive laboratory evidence of human immunodeficiency virus [HIV]: Secondary | ICD-10-CM

## 2013-02-06 DIAGNOSIS — R0609 Other forms of dyspnea: Secondary | ICD-10-CM | POA: Diagnosis present

## 2013-02-06 DIAGNOSIS — R399 Unspecified symptoms and signs involving the genitourinary system: Secondary | ICD-10-CM | POA: Diagnosis present

## 2013-02-06 LAB — LEGIONELLA ANTIGEN, URINE: LEGIONELLA ANTIGEN, URINE: NEGATIVE

## 2013-02-06 LAB — BASIC METABOLIC PANEL
BUN: 22 mg/dL (ref 6–23)
CALCIUM: 8.2 mg/dL — AB (ref 8.4–10.5)
CO2: 20 mEq/L (ref 19–32)
Chloride: 101 mEq/L (ref 96–112)
Creatinine, Ser: 1.3 mg/dL (ref 0.50–1.35)
GFR calc Af Amer: 56 mL/min — ABNORMAL LOW (ref >90)
GFR, EST NON AFRICAN AMERICAN: 48 mL/min — AB (ref >90)
GLUCOSE: 92 mg/dL (ref 70–99)
Potassium: 4.6 mEq/L (ref 3.7–5.3)
Sodium: 135 mEq/L — ABNORMAL LOW (ref 137–147)

## 2013-02-06 LAB — CBC
HEMATOCRIT: 32.5 % — AB (ref 39.0–52.0)
Hemoglobin: 10.8 g/dL — ABNORMAL LOW (ref 13.0–17.0)
MCH: 31.6 pg (ref 26.0–34.0)
MCHC: 33.2 g/dL (ref 30.0–36.0)
MCV: 95 fL (ref 78.0–100.0)
Platelets: 128 10*3/uL — ABNORMAL LOW (ref 150–400)
RBC: 3.42 MIL/uL — ABNORMAL LOW (ref 4.22–5.81)
RDW: 15.5 % (ref 11.5–15.5)
WBC: 7.6 10*3/uL (ref 4.0–10.5)

## 2013-02-06 MED ORDER — LEVOFLOXACIN 500 MG PO TABS: 500.0000 mg | ORAL_TABLET | Freq: Every day | ORAL | Status: DC

## 2013-02-06 MED ORDER — ASPIRIN 81 MG PO TBEC: 81.0000 mg | DELAYED_RELEASE_TABLET | Freq: Every day | ORAL | Status: AC

## 2013-02-06 NOTE — Progress Notes (Signed)
     SUBJECTIVE: No chest pain or SOB.   BP 140/63  Pulse 59  Temp(Src) 98.5 F (36.9 C) (Oral)  Resp 17  Ht 6' (1.829 m)  Wt 158 lb 8 oz (71.895 kg)  BMI 21.49 kg/m2  SpO2 97%  Intake/Output Summary (Last 24 hours) at 02/06/13 1136 Last data filed at 02/06/13 0819  Gross per 24 hour  Intake    600 ml  Output    900 ml  Net   -300 ml    PHYSICAL EXAM General: Well developed, well nourished, in no acute distress. Alert and oriented x 3.  Psych:  Good affect, responds appropriately Neck: No JVD. No masses noted.  Lungs: Clear bilaterally with no wheezes or rhonci noted.  Heart: RRR with loud systolic murmur.  Abdomen: Bowel sounds are present. Soft, non-tender.  Extremities: No lower extremity edema.   LABS: Basic Metabolic Panel:  Recent Labs  04/54/0901/26/15 0444 02/06/13 0545  NA 139 135*  K 5.1 4.6  CL 104 101  CO2 21 20  GLUCOSE 105* 92  BUN 19 22  CREATININE 1.43* 1.30  CALCIUM 7.9* 8.2*   CBC:  Recent Labs  02/03/13 1745  02/05/13 0444 02/06/13 0545  WBC 9.8  < > 9.3 7.6  NEUTROABS 7.5  --   --   --   HGB 12.7*  < > 11.1* 10.8*  HCT 37.8*  < > 33.8* 32.5*  MCV 93.6  < > 95.8 95.0  PLT 150  < > 123* 128*  < > = values in this interval not displayed. Cardiac Enzymes:  Recent Labs  02/03/13 2323 02/04/13 0410 02/04/13 1115  TROPONINI 0.59* 0.49* 0.40*    Current Meds: . allopurinol  300 mg Oral Daily  . aspirin EC  81 mg Oral Daily  . azithromycin  500 mg Oral QHS  . carvedilol  25 mg Oral BID WC  . cefTRIAXone (ROCEPHIN)  IV  1 g Intravenous Q24H  . docusate sodium  100 mg Oral BID  . ezetimibe-simvastatin  1 tablet Oral QHS  . gabapentin  300 mg Oral Daily  . gabapentin  600 mg Oral QHS  . predniSONE  2.5 mg Oral QODAY  . senna  1 tablet Oral BID  . sodium chloride  3 mL Intravenous Q12H  . tamsulosin  0.4 mg Oral Daily   ASSESSMENT AND PLAN:  1. CAD/NSTEMI: Type II in setting of pneumonia. He is known to have CAD with prior CABG.  He is having no chest pain. No ischemic evaluation at this time.   2. Aortic stenosis: Severe by echo 02/05/13 with mean gradient of 40mmHg. He is asymptomatic at this time. He would be a high risk candidate for redo open chest procedure. He would be a possible candidate for TAVR in the future if he becomes symptomatic with his AS. Will follow as an outpatient   3. Ischemic cardiomyopathy: LVEF=40-45%. Continue beta blocker. No ace-inh or ARB with renal insufficiency. Consider if renal function normalizes.   He can be discharged home today from cardiac standpoint. He can f/u in our office with Dr. Armanda Magicraci Turner.    [name]  1/27/201511:36 AM

## 2013-02-06 NOTE — Discharge Summary (Signed)
Physician Discharge Summary  [name] LYY:[id number] DOB: Dec 28, 1924 DOA: 02/03/2013  PCP: Chancy Hurter, MD  Admit date: 02/03/2013 Discharge date: 02/06/2013  Time spent: 35 minutes  Recommendations for Outpatient Follow-up:  1. HIV confirmatory test is pending. This will be follow up by ID.  2. Needs to follow up with cardio for further evaluation of aortic stenosis.  3. Needs to follow up with urology for voiding trial.   Discharge Diagnoses:    CAP (community acquired pneumonia)   NSTEMI   Aortic stenosis, severe.   HIV positive, probably false positive.    Urinary retention.    HYPERLIPIDEMIA   GOUT   HYPERTENSION   CORONARY ARTERY DISEASE   BPH (benign prostatic hyperplasia)   Elevated troponin   PAC (premature atrial contraction)   Atrial tachycardia   Discharge Condition: Stable.  Diet recommendation: Heart HEalthy  Filed Weights   02/03/13 2155  Weight: 71.895 kg (158 lb 8 oz)    History of present illness:  [name] is a 80 y.o. male, who presents from home for multiple complaints, mainly shortness of breath, cough with productive green sputum for the last 2 days, generalized body ache, and weakness, upon presentation patient was a febrile, did not have any hypoxia or shortness of breath, but his chest x-ray did show bilateral lung capacity, the patient was started on IV Rocephin and Zithromax for healthcare acquired pneumonia, as well he was started on Tamiflu empirically until his influenza results are back, patient was noticed to have elevated troponin at 0.36, patient denies any chest pain, patient EKG did show a right bundle branch block which appears to be old from previous EKG, but he did have mild ST depression in the anterior lateral leads, patient reports coronary artery disease and history of CABG in the past, the patient was given 325 mg of aspirin in ED.   Hospital Course: Patient admitted with dyspnea, cough. Found to have PNA, N-STEMI. He  developed urinary rentention. Foley was placed. He had traumatic foley placement.   1.Pneumonia; He was treated with Ceftriaxone and Azithromycin day 3. Influenza panel negative. Strept Pneumonia and legionella antigen negative. He will be discharge on 4 more days of antibiotics.   2.NSTEMI; troponin increase at 0.40 ; holder parameters for Coreg due to hypotension. Continue with aspirin, Vytorin. ECHO show severe aortic stenosis.   3. Hypertension. BP medication on hold during this admission due to hypotension.  4. Hyperlipidemia. Continue with Vytorin.  5. Gout. Continue with allopurinol.  6. BPH.  Resume Finasteride and  Flomax, hypotension resolved.  7. DVT Prophylaxis Heparin -  8-Hypotension; hold multiple BP medications, Imdur, lasix, lisinopril. IV bolus as needed. Lactic acid normal at 1.7. BP improved after 250 cc bolus. Cortisol at 13. BP increasing. Resume lasix at discharge. 9.Hypokalemia; resolved.  10-Acute Kidney Injury; mild increase Cr at 1.4. In setting hypotension, infection and urine retension. IV fluids.  11-HIV reactive. ID will follow. Viral load pending. ? False positive ?Marland Kitchen    Procedures:  ECHO;   Consultations:  ID  CArdiology  Discharge Exam: Filed Vitals:   02/06/13 0535  BP: 140/63  Pulse: 59  Temp: 98.5 F (36.9 C)  Resp: 17    General: No distress.  Cardiovascular: S 1, S 2 RRR Respiratory: CTA  Discharge Instructions  Discharge Orders   Future Appointments Provider Department Dept Phone   01/01/2014 11:00 AM [facility] [address] [phone]   01/01/2014 12:00 PM Sharmon Leyden Nickel,  NP [facility] [phone]   Future Orders Complete By Expires   Diet - low sodium heart healthy  As directed    Increase activity slowly  As directed        Medication List    STOP taking these medications       isosorbide mononitrate 60 MG 24 hr tablet  Commonly known as:  IMDUR      lisinopril 20 MG tablet  Commonly known as:  PRINIVIL,ZESTRIL      TAKE these medications       allopurinol 300 MG tablet  Commonly known as:  ZYLOPRIM  Take 300 mg by mouth daily.     aspirin 81 MG EC tablet  Take 1 tablet (81 mg total) by mouth daily.     carvedilol 25 MG tablet  Commonly known as:  COREG  Take 25 mg by mouth 2 (two) times daily with a meal.     ezetimibe-simvastatin 10-40 MG per tablet  Commonly known as:  VYTORIN  Take 1 tablet by mouth at bedtime.     finasteride 5 MG tablet  Commonly known as:  PROSCAR  Take 5 mg by mouth daily.     furosemide 40 MG tablet  Commonly known as:  LASIX  Take 40 mg by mouth daily.     gabapentin 300 MG capsule  Commonly known as:  NEURONTIN  Take 300-600 mg by mouth 3 (three) times daily. Take 300 mg every morning & 600 mg at bedtime     levofloxacin 500 MG tablet  Commonly known as:  LEVAQUIN  Take 1 tablet (500 mg total) by mouth daily.     nitroGLYCERIN 0.4 MG SL tablet  Commonly known as:  NITROSTAT  Place 0.4 mg under the tongue every 5 (five) minutes as needed for chest pain.     predniSONE 5 MG tablet  Commonly known as:  DELTASONE  Take 2.5 mg by mouth every other day.     tamsulosin 0.4 MG Caps capsule  Commonly known as:  FLOMAX  Take 0.4 mg by mouth daily.       Allergies  Allergen Reactions  . Nsaids     REACTION: renal failure       Follow-up Information   Follow up with Chancy Hurter, MD. Schedule an appointment as soon as possible for a visit on 02/06/2013.   Specialties:  Internal Medicine, Radiology   Contact information:   [place] [address] [phone]       Follow up with Sueanne Margarita, MD In 2 weeks.   Specialty:  Cardiology   Contact information:   [address] [place] [address] [phone]       Please follow up. (please follow up with your urology next week. )        The results of significant diagnostics from  this hospitalization (including imaging, microbiology, ancillary and laboratory) are listed below for reference.    Significant Diagnostic Studies: Dg Chest 2 View  02/03/2013   CLINICAL DATA:  Generalized body aches, chills, cough and congestion with shortness of breath.  EXAM: CHEST  2 VIEW  COMPARISON:  12/15/2011 and CT 12/15/2011  FINDINGS: Lungs are adequately inflated with patchy mixed interstitial airspace density over the mid to lower lungs which may be due to pneumonia as there is likely a component of chronic basilar interstitial disease. There is stable cardiomegaly. Remainder the exam is unchanged.  IMPRESSION: Patchy mixed interstitial  airspace density over the mid to lower lungs likely acute infection on a background of mild chronic interstitial disease.  Cardiomegaly.   Electronically Signed   By: Marin Olp M.D.   On: 02/03/2013 18:50    Microbiology: Recent Results (from the past 240 hour(s))  URINE CULTURE     Status: None   Collection Time    02/03/13  7:15 PM      Result Value Range Status   Specimen Description URINE, CLEAN CATCH   Final   Special Requests NONE   Final   Culture  Setup Time     Final   Value: 02/04/2013 05:47     Performed at [facility]     Final   Value: 75,000 COLONIES/ML     Performed at [facility]   Culture     Final   Value: Multiple bacterial morphotypes present, none predominant. Suggest appropriate recollection if clinically indicated.     Performed at [facility]   Report Status 02/05/2013 FINAL   Final  RESPIRATORY VIRUS PANEL     Status: None   Collection Time    02/03/13  8:40 PM      Result Value Range Status   Source - RVPAN NOT GIVEN   Corrected   Comment: CORRECTED ON 01/26 AT 1951: PREVIOUSLY REPORTED AS NASAL SWAB   Respiratory Syncytial Virus A NOT DETECTED   Final   Respiratory Syncytial Virus B NOT DETECTED   Final   Influenza A NOT DETECTED   Final   Influenza B NOT DETECTED    Final   Parainfluenza 1 NOT DETECTED   Final   Parainfluenza 2 NOT DETECTED   Final   Parainfluenza 3 NOT DETECTED   Final   Metapneumovirus NOT DETECTED   Final   Rhinovirus NOT DETECTED   Final   Adenovirus NOT DETECTED   Final   Influenza A H1 NOT DETECTED   Final   Influenza A H3 NOT DETECTED   Final   Comment: (NOTE)           Normal Reference Range for each Analyte: NOT DETECTED     Testing performed using the Luminex xTAG Respiratory Viral Panel test     kit.     This test was developed and its performance characteristics determined     by [facility]. It has not been cleared or approved by the [place]     Food and Drug Administration. This test is used for clinical purposes.     It should not be regarded as investigational or for research. This     laboratory is certified under the [place] (CLIA) as qualified to perform high complexity     clinical laboratory testing.     Performed at [facility]: Basic Metabolic Panel:  Recent Labs Lab 02/03/13 1745 02/03/13 2323 02/04/13 0410 02/05/13 0444 02/06/13 0545  NA 140  --  140 139 135*  K 3.8  --  3.5* 5.1 4.6  CL 102  --  103 104 101  CO2 25  --  $R'24 21 20  'Xl$ GLUCOSE 101*  --  98 105* 92  BUN 15  --  $R'15 19 22  'oC$ CREATININE 1.16 1.16 1.14 1.43* 1.30  CALCIUM 8.0*  --  7.6* 7.9* 8.2*   Liver Function Tests:  Recent Labs Lab 02/03/13 1745  AST 33  ALT 37  ALKPHOS 92  BILITOT 1.7*  PROT 6.4  ALBUMIN 3.0*   No results found for this basename: LIPASE, AMYLASE,  in the last 168 hours No results found for this basename: AMMONIA,  in the last 168 hours CBC:  Recent Labs Lab 02/03/13 1745 02/03/13 2323 02/04/13 0410 02/05/13 0444 02/06/13 0545  WBC 9.8 9.9 7.8 9.3 7.6  NEUTROABS 7.5  --   --   --   --   HGB 12.7* 12.7* 10.9* 11.1* 10.8*  HCT 37.8* 37.4* 32.9* 33.8* 32.5*  MCV 93.6 93.7 94.5 95.8 95.0  PLT 150 150 134* 123* 128*   Cardiac  Enzymes:  Recent Labs Lab 02/03/13 2323 02/04/13 0410 02/04/13 1115  TROPONINI 0.59* 0.49* 0.40*   BNP: BNP (last 3 results) No results found for this basename: PROBNP,  in the last 8760 hours CBG: No results found for this basename: GLUCAP,  in the last 168 hours     Signed:  Ebone Alcivar  [facility] 02/06/2013, 10:19 AM

## 2013-02-06 NOTE — Consult Note (Signed)
[facility]  Total days of antibiotics 4        Day 4 azithromycin        Day 4 ceftriaxone               Reason for Consult: hiv elisa +    Referring Physician: regalado  Principal Problem:   CAP (community acquired pneumonia) Active Problems:   HYPERLIPIDEMIA   GOUT   HYPERTENSION   CORONARY ARTERY DISEASE   BPH (benign prostatic hyperplasia)   Elevated troponin   PAC (premature atrial contraction)   Atrial tachycardia    HPI: [name] is a 80 y.o. male with hx of CAD s/p CABG, BPH admitted on 1/24 for shortness of breath, cough with productive green sputum for the last 2 days, generalized body ache, and weakness, where he was treated for CAP with IV Rocephin and Zithromax for healthcare acquired pneumonia, and initially started onTamiflu subsequently discontinued when flu pcr was negative. He did sustain associated cardiac strain/NSTEMI, with troponin peaked at .059 associated with his illness, being followed by cardiology. As part of his hospital admission. HIV elisa was tested and found to be positive. Risk factors for hiv include sex with women, no IVDU. He reports having been married for 27yr but his wife had multiple extra-marital relations +17 partners that he recalls. Never previously been tested for hiv.  Past Medical History  Diagnosis Date  . Coronary artery disease   . Stroke   . Benign prostatic hypertrophy   . Myocardial infarction   . Shortness of breath   . Dysrhythmia   . Heart murmur   . Colitis 12/16/2011  . Irregular heart beat     Allergies:  Allergies  Allergen Reactions  . Nsaids     REACTION: renal failure    Current antibiotics:   MEDICATIONS: . allopurinol  300 mg Oral Daily  . aspirin EC  81 mg Oral Daily  . azithromycin  500 mg Oral QHS  . carvedilol  25 mg Oral BID WC  . cefTRIAXone (ROCEPHIN)  IV  1 g Intravenous Q24H  . docusate sodium  100 mg Oral BID  . ezetimibe-simvastatin  1 tablet Oral QHS    . gabapentin  300 mg Oral Daily  . gabapentin  600 mg Oral QHS  . predniSONE  2.5 mg Oral QODAY  . senna  1 tablet Oral BID  . sodium chloride  3 mL Intravenous Q12H  . tamsulosin  0.4 mg Oral Daily    History  Substance Use Topics  . Smoking status: Former Smoker    Quit date: 01/12/1988  . Smokeless tobacco: Never Used  . Alcohol Use: No    Family History  Problem Relation Age of Onset  . Stroke Father   . Heart attack Father   . Heart attack Mother   . Heart attack Brother   . Peripheral vascular disease Brother     Review of Systems  Constitutional: Negative for fever, chills, diaphoresis, activity change, appetite change, fatigue and unexpected weight change.  HENT: Negative for congestion, sore throat, rhinorrhea, sneezing, trouble swallowing and sinus pressure.  Eyes: Negative for photophobia and visual disturbance.  Respiratory: Negative for cough, chest tightness, shortness of breath, wheezing and stridor.  Cardiovascular: Negative for chest pain, palpitations and leg swelling.  Gastrointestinal: Negative for nausea, vomiting, abdominal pain, diarrhea, constipation, blood in stool, abdominal distention and anal bleeding.  Genitourinary: Negative for dysuria, hematuria, flank pain and difficulty urinating.  Musculoskeletal: Negative for myalgias, back pain, joint swelling, arthralgias and gait problem.  Skin: Negative for color change, pallor, rash and wound.  Neurological: Negative for dizziness, tremors, weakness and light-headedness.  Hematological: Negative for adenopathy. Does not bruise/bleed easily.  Psychiatric/Behavioral: Negative for behavioral problems, confusion, sleep disturbance, dysphoric mood, decreased concentration and agitation.     OBJECTIVE: Temp:  [98.4 F (36.9 C)-99.3 F (37.4 C)] 98.5 F (36.9 C) (01/27 0535) Pulse Rate:  [55-64] 59 (01/27 0535) Resp:  [16-17] 17 (01/27 0535) BP: (101-140)/(31-63) 140/63 mmHg (01/27 0535) SpO2:  [96  %-97 %] 97 % (01/27 0535)  Constitutional: He is oriented to person, place, and time. He appears well-developed and well-nourished. No distress.  HENT:  Mouth/Throat: Oropharynx is clear and moist. No oropharyngeal exudate.  Cardiovascular: Normal rate, regular rhythm and 3/6 harsh holosystolic M BH RUSB and apex. Exam reveals no gallop and no friction rub.  No murmur heard.  Pulmonary/Chest: Effort normal and breath sounds normal. No respiratory distress. He has no wheezes.  Abdominal: Soft. Bowel sounds are normal. He exhibits no distension. There is no tenderness.  Lymphadenopathy:  no cervical adenopathy.  Neurological: He is alert and oriented to person, place, and time.  Skin: Skin is warm and dry. No rash noted. No erythema.  Psychiatric: He has a normal mood and affect. His behavior is normal.    LABS: Results for orders placed during the hospital encounter of 02/03/13 (from the past 48 hour(s))  TROPONIN I     Status: Abnormal   Collection Time    02/04/13 11:15 AM      Result Value Range   Troponin I 0.40 (*) <0.30 ng/mL   Comment:            Due to the release kinetics of cTnI,     a negative result within the first hours     of the onset of symptoms does not rule out     myocardial infarction with certainty.     If myocardial infarction is still suspected,     repeat the test at appropriate intervals.     CRITICAL VALUE NOTED.  VALUE IS CONSISTENT WITH PREVIOUSLY REPORTED AND CALLED VALUE.  CORTISOL     Status: None   Collection Time    02/04/13  3:21 PM      Result Value Range   Cortisol, Plasma 13.0     Comment: (NOTE)     AM:  4.3 - 22.4 ug/dL     PM:  3.1 - 16.7 ug/dL     Performed at [facility]  CBC     Status: Abnormal   Collection Time    02/05/13  4:44 AM      Result Value Range   WBC 9.3  4.0 - 10.5 K/uL   RBC 3.53 (*) 4.22 - 5.81 MIL/uL   Hemoglobin 11.1 (*) 13.0 - 17.0 g/dL   HCT 33.8 (*) 39.0 - 52.0 %   MCV 95.8  78.0 - 100.0 fL   MCH  31.4  26.0 - 34.0 pg   MCHC 32.8  30.0 - 36.0 g/dL   RDW 15.7 (*) 11.5 - 15.5 %   Platelets 123 (*) 150 - 400 K/uL  BASIC METABOLIC PANEL     Status: Abnormal   Collection Time    02/05/13  4:44 AM      Result Value Range   Sodium 139  137 - 147 mEq/L   Potassium 5.1  3.7 - 5.3 mEq/L  Comment: DELTA CHECK NOTED   Chloride 104  96 - 112 mEq/L   CO2 21  19 - 32 mEq/L   Glucose, Bld 105 (*) 70 - 99 mg/dL   BUN 19  6 - 23 mg/dL   Creatinine, Ser 1.43 (*) 0.50 - 1.35 mg/dL   Calcium 7.9 (*) 8.4 - 10.5 mg/dL   GFR calc non Af Amer 43 (*) >90 mL/min   GFR calc Af Amer 50 (*) >90 mL/min   Comment: (NOTE)     The eGFR has been calculated using the CKD EPI equation.     This calculation has not been validated in all clinical situations.     eGFR's persistently <90 mL/min signify possible Chronic Kidney     Disease.  LEGIONELLA ANTIGEN, URINE     Status: None   Collection Time    02/05/13  3:43 PM      Result Value Range   Specimen Description URINE, RANDOM     Special Requests NONE     Legionella Antigen, Urine       Value: Negative for Legionella pneumophilia serogroup 1     Performed at [facility]   Report Status 02/06/2013 FINAL    STREP PNEUMONIAE URINARY ANTIGEN     Status: None   Collection Time    02/05/13  3:43 PM      Result Value Range   Strep Pneumo Urinary Antigen NEGATIVE  NEGATIVE   Comment:            Infection due to S. pneumoniae     cannot be absolutely ruled out     since the antigen present     may be below the detection limit     of the test.  INFLUENZA PANEL BY PCR (TYPE A & B, H1N1)     Status: None   Collection Time    02/05/13  6:09 PM      Result Value Range   Influenza A By PCR NEGATIVE  NEGATIVE   Influenza B By PCR NEGATIVE  NEGATIVE   H1N1 flu by pcr NOT DETECTED  NOT DETECTED   Comment:            The Xpert Flu assay (FDA approved for     nasal aspirates or washes and     nasopharyngeal swab specimens), is     intended as an aid  in the diagnosis of     influenza and should not be used as     a sole basis for treatment.  CBC     Status: Abnormal   Collection Time    02/06/13  5:45 AM      Result Value Range   WBC 7.6  4.0 - 10.5 K/uL   RBC 3.42 (*) 4.22 - 5.81 MIL/uL   Hemoglobin 10.8 (*) 13.0 - 17.0 g/dL   HCT 32.5 (*) 39.0 - 52.0 %   MCV 95.0  78.0 - 100.0 fL   MCH 31.6  26.0 - 34.0 pg   MCHC 33.2  30.0 - 36.0 g/dL   RDW 15.5  11.5 - 15.5 %   Platelets 128 (*) 150 - 400 K/uL  BASIC METABOLIC PANEL     Status: Abnormal   Collection Time    02/06/13  5:45 AM      Result Value Range   Sodium 135 (*) 137 - 147 mEq/L   Potassium 4.6  3.7 - 5.3 mEq/L   Chloride 101  96 - 112 mEq/L  CO2 20  19 - 32 mEq/L   Glucose, Bld 92  70 - 99 mg/dL   BUN 22  6 - 23 mg/dL   Creatinine, Ser 1.30  0.50 - 1.35 mg/dL   Calcium 8.2 (*) 8.4 - 10.5 mg/dL   GFR calc non Af Amer 48 (*) >90 mL/min   GFR calc Af Amer 56 (*) >90 mL/min   Comment: (NOTE)     The eGFR has been calculated using the CKD EPI equation.     This calculation has not been validated in all clinical situations.     eGFR's persistently <90 mL/min signify possible Chronic Kidney     Disease.    MICRO: Ur cx 1/24: 75,000 mixed morphology 1/24 RVP negative 1/24 flu negative Imaging: TTE 1/26 Left ventricle: LVEF is approximately 40 to 45% with akinesis of the base/mid inferior and posterior walls The cavity size was mildly dilated. Wall thickness was increased in a pattern of mild LVH. Doppler parameters are consistent with abnormal left ventricular relaxation (grade 1 diastolic dysfunction). - Aortic valve: AV is thickened, calcified with restricted motion. Peak and mean gradients through the valve are 67 and 40 mm HG respectively consistent with severe AS> Mild to moderate regurgitation. - Mitral valve: Calcified annulus. Mildly thickened leaflets . Mild regurgitation. - Left atrium: The atrium was moderately to severely dilated. - Right  ventricle: The cavity size was mildly dilated. Systolic function was mildly to moderately reduced. - Tricuspid valve: Mild-moderate regurgitation. - Pulmonary arteries: PA peak pressure: 13mm Hg (S).   Assessment/Plan:  80yo M with multiple medical problems admitted for flulike symptoms treated for CAP, found to have positive HIV elisa, likely false positive.  - await HIV viral load results  - if confirmatory testing positive for hiv, we will have him be seen in our ID clinic for follow up and further management  Yasiel Goyne B. [facility] [phone]

## 2013-02-06 NOTE — Progress Notes (Signed)
Moderate amount of blood noted on patient's bed pad, coming from his urinary meatus.  Clot also noted at meatus around foley catheter, and only ~50 ml tea colored urine noted in foley bag, raising concern that foley catheter may be blocked by clots.  Urologist notified.  Orders received to irrigate foley.  Foley irrigated x 2 with 30 ml sterile water, each time with clear return.  No clots or blood noted with irrigation.  Patient tolerated well.  Will continue to monitor.  [name], [name]

## 2013-02-06 NOTE — ED Provider Notes (Signed)
Medical screening examination/treatment/procedure(s) were conducted as a shared visit with non-physician practitioner(s) and myself.  I personally evaluated the patient during the encounter.  EKG Interpretation    Date/Time:  Saturday February 03 2013 17:52:41 EST Ventricular Rate:  95 PR Interval:    QRS Duration: 157 QT Interval:  405 QTC Calculation: 509 R Axis:   57 Text Interpretation:  Atrial fibrillation Ventricular premature complex Right bundle branch block ST depr, consider ischemia, anterolateral lds Baseline wander in lead(s) V3 Confirmed by Thresa Dozier  MD, Romina Divirgilio ([id number]) on 02/03/2013 6:30:06 PM            I interviewed and examined the patient. Lungs are CTAB. Cardiac exam w/ murmur, tachycardia. Abdomen soft.  Suspect viral syndrome, such as influenza. Pt denies any cp. Found to have pna on CXR. Trop mildly elevated w/ suspicious ecg. Cardiology consulted for evaluation. Likely NSTEMI related to pna. Ordered ASA.   CRITICAL CARE Performed by: Purvis SheffieldHARRISON, Noretta Frier, S Total critical care time: 30 min Critical care time was exclusive of separately billable procedures and treating other patients. Critical care was necessary to treat or prevent imminent or life-threatening deterioration. Critical care was time spent personally by me on the following activities: development of treatment plan with patient and/or surrogate as well as nursing, discussions with consultants, evaluation of patient's response to treatment, examination of patient, obtaining history from patient or surrogate, ordering and performing treatments and interventions, ordering and review of laboratory studies, ordering and review of radiographic studies, pulse oximetry and re-evaluation of patient's condition.   Clinical Impression 1. Flu-like symptoms   2. UTI symptoms   3. DOE (dyspnea on exertion)   4. CAP (community acquired pneumonia)   5. BPH (benign prostatic hyperplasia)   6. Elevated troponin   7.  Pneumonia   8. Unspecified essential hypertension   9. Carotid stenosis   10. Coronary atherosclerosis of unspecified type of vessel, native or graft   11. Atrial fibrillation   12. NSTEMI (non-ST elevated myocardial infarction)   13. Dyslipidemia   14. Essential hypertension   15. Atrial tachycardia   16. PAC (premature atrial contraction)   17. HYPERTENSION   18. CORONARY ARTERY DISEASE      Junius ArgyleForrest S Dalynn Jhaveri, MD 02/06/13 1120

## 2013-02-06 NOTE — Progress Notes (Signed)
Pt d/c'd to private vehicle with staff to ride home with daughter. He is in stable condition and catheter left in place as ordered but MD.

## 2013-02-07 ENCOUNTER — Telehealth: Payer: Self-pay | Admitting: Internal Medicine

## 2013-02-07 LAB — T-HELPER CELLS (CD4) COUNT (NOT AT ARMC)
CD4 % Helper T Cell: 47 % (ref 33–55)
CD4 T Cell Abs: 560 /uL (ref 400–2700)

## 2013-02-07 LAB — HIV-1 RNA ULTRAQUANT REFLEX TO GENTYP+
HIV 1 RNA Quant: <20 copies/mL (ref <20)
HIV-1 RNA Quant, Log: <1.3 {Log} (ref <1.30)

## 2013-02-07 NOTE — Telephone Encounter (Signed)
Called patient to inform him that hiv testing false positive given undetectable viral load.

## 2013-02-08 ENCOUNTER — Telehealth: Payer: Self-pay | Admitting: *Deleted

## 2013-02-08 LAB — HIV 1/2 CONFIRMATION
HIV-1 antibody: NEGATIVE
HIV-2 Ab: NEGATIVE

## 2013-02-08 NOTE — Telephone Encounter (Signed)
Transitional caredmit date: 02/03/2013  Discharge date: 02/06/2013  Time spent: 35 minutes  Recommendations for Outpatient Follow-up:  1. HIV confirmatory test is pending. This will be follow up by ID.  2. Needs to follow up with cardio for further evaluation of aortic stenosis.  3. Needs to follow up with urology for voiding trial.  Discharge Diagnoses:  CAP (community acquired pneumonia)  NSTEMI  Aortic stenosis, severe.  HIV positive, probably false positive.  Urinary retention.  HYPERLIPIDEMIA  GOUT  HYPERTENSION  CORONARY ARTERY DISEASE  BPH (benign prostatic hyperplasia)  Elevated troponin  PAC (premature atrial contraction)  Atrial tachycardia  Discharge Condition: Stable.  Diet recommendation: Heart HEalthy  Filed Weights    02/03/13 2155   Weight:  71.895 kg (158 lb 8 oz)   History of present illness:  [name] is a 80 y.o. male, who presents from home for multiple complaints, mainly shortness of breath, cough with productive green sputum for the last 2 days, generalized body ache, and weakness, upon presentation patient was a febrile, did not have any hypoxia or shortness of breath, but his chest x-ray did show bilateral lung capacity, the patient was started on IV Rocephin and Zithromax for healthcare acquired pneumonia, as well he was started on Tamiflu empirically until his influenza results are back, patient was noticed to have elevated troponin at 0.36, patient denies any chest pain, patient EKG did show a right bundle branch block which appears to be old from previous EKG, but he did have mild ST depression in the anterior lateral leads, patient reports coronary artery disease and history of CABG in the past, the patient was given 325 mg of aspirin in ED.  Hospital Course:  Patient admitted with dyspnea, cough. Found to have PNA, N-STEMI. He developed urinary rentention. Foley was placed. He had traumatic foley placement.  1.Pneumonia; He was treated with Ceftriaxone and  Azithromycin day 3. Influenza panel negative. Strept Pneumonia and legionella antigen negative. He will be discharge on 4 more days of antibiotics.  2.NSTEMI; troponin increase at 0.40 ; holder parameters for Coreg due to hypotension. Continue with aspirin, Vytorin. ECHO show severe aortic stenosis.  3. Hypertension. BP medication on hold during this admission due to hypotension.  4. Hyperlipidemia. Continue with Vytorin.  5. Gout. Continue with allopurinol.  6. BPH. Resume Finasteride and Flomax, hypotension resolved.  7. DVT Prophylaxis Heparin -  8-Hypotension; hold multiple BP medications, Imdur, lasix, lisinopril. IV bolus as needed. Lactic acid normal at 1.7. BP improved after 250 cc bolus. Cortisol at 13. BP increasing. Resume lasix at discharge.  9.Hypokalemia; resolved.  10-Acute Kidney Injury; mild increase Cr at 1.4. In setting hypotension, infection and urine retension. IV fluids.  11-HIV reactive. ID will follow. Viral load pending. ? False positive ?Marland Kitchen.  Procedures:   Talked with patient and he states he is feeling much better with no coughing or SOB.  Very talkative gentlemen with no SOB or coughing while he was talking. States he has not gotten his levaquin yet,pharmacy was out, but he will pick up today and start taking. Patient states he is compliant with his medication and his appetite is good. He is ambulating around the house.  patietn has a follow up visit with dr Cato MulliganSwords on feb 6, Friday at [facility]9am and patient is aware and states he will be here.

## 2013-02-09 LAB — HIV-1 RNA, QUALITATIVE, TMA: HIV-1 RNA, QUAL: NOT DETECTED

## 2013-02-15 ENCOUNTER — Encounter: Payer: Self-pay | Admitting: Family Medicine

## 2013-02-15 ENCOUNTER — Ambulatory Visit (INDEPENDENT_AMBULATORY_CARE_PROVIDER_SITE_OTHER): Payer: Medicare Other | Admitting: Family Medicine

## 2013-02-15 VITALS — BP 130/68 | HR 78 | Wt 160.0 lb

## 2013-02-15 DIAGNOSIS — I10 Essential (primary) hypertension: Secondary | ICD-10-CM

## 2013-02-15 DIAGNOSIS — N19 Unspecified kidney failure: Secondary | ICD-10-CM

## 2013-02-15 DIAGNOSIS — R600 Localized edema: Secondary | ICD-10-CM

## 2013-02-15 DIAGNOSIS — R609 Edema, unspecified: Secondary | ICD-10-CM

## 2013-02-15 LAB — BASIC METABOLIC PANEL
BUN: 18 mg/dL (ref 6–23)
CO2: 25 mEq/L (ref 19–32)
CREATININE: 1.7 mg/dL — AB (ref 0.4–1.5)
Calcium: 8.7 mg/dL (ref 8.4–10.5)
Chloride: 106 mEq/L (ref 96–112)
GFR: 41.87 mL/min — ABNORMAL LOW (ref >60.00)
Glucose, Bld: 92 mg/dL (ref 70–99)
POTASSIUM: 4.2 meq/L (ref 3.5–5.1)
Sodium: 137 mEq/L (ref 135–145)

## 2013-02-15 NOTE — Progress Notes (Signed)
   Subjective:    Patient ID: [name], male    DOB: 12/02/1924, 80 y.o.   MRN: [id number]  HPI Acute visit Patient has chronic problems of ischemic cardiomyopathy, history of peripheral vascular disease, hypertension, chronic kidney disease, aortic stenosis, BPH who was recently admitted for community-acquired pneumonia. He remains on Levaquin.  He is seen with 3 day history of bilateral leg edema. He has some chronic dyspnea with activity which is unchanged. He had recent echocardiogram 26th of January left ventricular ejection fraction 40-45%. He takes furosemide 40 mg once daily in addition to multiple other medications reviewed. Recent albumin 3.0. He does take gabapentin but has been on this for some time. History of BPH does not have any current urinary obstructive symptoms. Chronic kidney disease with recent GFR 43. He does not have any home scales and is not getting any daily weights  Past Medical History  Diagnosis Date  . Coronary artery disease   . Stroke   . Benign prostatic hypertrophy   . Myocardial infarction   . Shortness of breath   . Dysrhythmia   . Heart murmur   . Colitis 12/16/2011  . Irregular heart beat    Past Surgical History  Procedure Laterality Date  . Appendectomy    . Coronary artery bypass graft    . Hemorrhoid surgery    . Knee arthroscopy    . Elbow surgery    . Cataract extraction      x2  . Eye surgery      Cataract  . Carotid endarterectomy Left 08-11-04    cea  . Carotid endarterectomy Right 10-08-04    cea    reports that he quit smoking about 25 years ago. He has never used smokeless tobacco. He reports that he does not drink alcohol or use illicit drugs. family history includes Heart attack in his brother, father, and mother; Peripheral vascular disease in his brother; Stroke in his father. Allergies  Allergen Reactions  . Nsaids     REACTION: renal failure      Review of Systems  Constitutional: Positive for fatigue. Negative  for fever, chills and unexpected weight change.  Respiratory: Negative for cough and wheezing.   Cardiovascular: Positive for leg swelling. Negative for chest pain and palpitations.  Neurological: Negative for dizziness and syncope.  Hematological: Negative for adenopathy.  Psychiatric/Behavioral: Negative for confusion.       Objective:   Physical Exam  Constitutional: He appears well-developed and well-nourished.  Neck: Neck supple. No thyromegaly present.  Cardiovascular: Normal rate.  Exam reveals no gallop.   Murmur heard. Pulmonary/Chest: Effort normal and breath sounds normal. No respiratory distress. He has no wheezes. He has no rales.  Musculoskeletal: He exhibits edema.  Patient has 2+ pitting edema legs bilaterally. No skin lesions          Assessment & Plan:  Progressive bilateral leg edema. Suspect multifactorial-chronic kidney disease, recent IV fluids with question of fluid overload, low albumin, gabapentin.  No evidence for overt heart failure. He has normal lung exam. Elevate legs frequently. Increased furosemide 40 mg twice a day until follow up on Monday. Recheck basic metabolic panel.

## 2013-02-15 NOTE — Progress Notes (Signed)
Pre visit review using our clinic review tool, if applicable. No additional management support is needed unless otherwise documented below in the visit note. 

## 2013-02-15 NOTE — Patient Instructions (Signed)
Elevate legs frequently Increase furosemide to 40 mg TWICE daily Watch sodium/salt intake.

## 2013-02-16 ENCOUNTER — Ambulatory Visit: Payer: Medicare Other | Admitting: Internal Medicine

## 2013-02-16 ENCOUNTER — Telehealth: Payer: Self-pay | Admitting: Internal Medicine

## 2013-02-16 NOTE — Telephone Encounter (Signed)
Relevant patient education mailed to patient.  

## 2013-02-19 ENCOUNTER — Ambulatory Visit (INDEPENDENT_AMBULATORY_CARE_PROVIDER_SITE_OTHER): Payer: Medicare Other | Admitting: Internal Medicine

## 2013-02-19 ENCOUNTER — Encounter: Payer: Self-pay | Admitting: Internal Medicine

## 2013-02-19 VITALS — BP 124/76 | HR 60 | Temp 98.0°F | Ht 72.0 in | Wt 165.0 lb

## 2013-02-19 DIAGNOSIS — R609 Edema, unspecified: Secondary | ICD-10-CM

## 2013-02-19 DIAGNOSIS — R600 Localized edema: Secondary | ICD-10-CM

## 2013-02-19 DIAGNOSIS — J189 Pneumonia, unspecified organism: Secondary | ICD-10-CM

## 2013-02-19 DIAGNOSIS — I2510 Atherosclerotic heart disease of native coronary artery without angina pectoris: Secondary | ICD-10-CM

## 2013-02-19 NOTE — Progress Notes (Signed)
Pre visit review using our clinic review tool, if applicable. No additional management support is needed unless otherwise documented below in the visit note. 

## 2013-02-19 NOTE — Progress Notes (Signed)
Post hopsital note  Pneumonia- clinically he is doing better  NSTEMI- unable to tolerate coreg due to hypotension per dc note but it is on his list and he thinks he is taking it  LE edema- since leaving the hospital- he describes dependent edema  Past Medical History  Diagnosis Date  . Coronary artery disease   . Stroke   . Benign prostatic hypertrophy   . Myocardial infarction   . Shortness of breath   . Dysrhythmia   . Heart murmur   . Colitis 12/16/2011  . Irregular heart beat     History   Social History  . Marital Status: Widowed    Spouse Name: N/A    Number of Children: N/A  . Years of Education: N/A   Occupational History  . Not on file.   Social History Main Topics  . Smoking status: Former Smoker    Quit date: 01/12/1988  . Smokeless tobacco: Never Used  . Alcohol Use: No  . Drug Use: No  . Sexual Activity: No   Other Topics Concern  . Not on file   Social History Narrative  . No narrative on file    Past Surgical History  Procedure Laterality Date  . Appendectomy    . Coronary artery bypass graft    . Hemorrhoid surgery    . Knee arthroscopy    . Elbow surgery    . Cataract extraction      x2  . Eye surgery      Cataract  . Carotid endarterectomy Left 08-11-04    cea  . Carotid endarterectomy Right 10-08-04    cea    Family History  Problem Relation Age of Onset  . Stroke Father   . Heart attack Father   . Heart attack Mother   . Heart attack Brother   . Peripheral vascular disease Brother     Allergies  Allergen Reactions  . Nsaids     REACTION: renal failure    Current Outpatient Prescriptions on File Prior to Visit  Medication Sig Dispense Refill  . allopurinol (ZYLOPRIM) 300 MG tablet Take 300 mg by mouth daily.      Marland Kitchen. aspirin EC 81 MG EC tablet Take 1 tablet (81 mg total) by mouth daily.  30 tablet  0  . carvedilol (COREG) 25 MG tablet Take 25 mg by mouth 2 (two) times daily with a meal.      . ezetimibe-simvastatin  (VYTORIN) 10-40 MG per tablet Take 1 tablet by mouth at bedtime.       . finasteride (PROSCAR) 5 MG tablet Take 5 mg by mouth daily.      . furosemide (LASIX) 40 MG tablet Take 80 mg by mouth daily.       Marland Kitchen. gabapentin (NEURONTIN) 300 MG capsule Take 300-600 mg by mouth 3 (three) times daily. Take 300 mg every morning & 600 mg at bedtime      . nitroGLYCERIN (NITROSTAT) 0.4 MG SL tablet Place 0.4 mg under the tongue every 5 (five) minutes as needed for chest pain.      . predniSONE (DELTASONE) 5 MG tablet Take 2.5 mg by mouth every other day.       . tamsulosin (FLOMAX) 0.4 MG CAPS capsule Take 0.4 mg by mouth daily.       No current facility-administered medications on file prior to visit.     patient denies chest pain, shortness of breath, orthopnea. Denies lower extremity edema, abdominal pain, change in appetite,  change in bowel movements. Patient denies rashes, musculoskeletal complaints. No other specific complaints in a complete review of systems.   BP 124/76  Pulse 60  Temp(Src) 98 F (36.7 C) (Oral)  Ht 6' (1.829 m)  Wt 165 lb (74.844 kg)  BMI 22.37 kg/m2   well-developed well-nourished male in no acute distress. HEENT exam atraumatic, normocephalic, neck supple without jugular venous distention. Chest clear to auscultation cardiac exam S1-S2 are regular. Abdominal exam overweight with bowel sounds, soft and nontender. Extremities with 3 + edema. Neurologic exam is alert

## 2013-02-19 NOTE — Assessment & Plan Note (Addendum)
Clinically doing much better im concerned with le EDEMA and i don't see an obvious reason.  Schedule doppler

## 2013-02-20 ENCOUNTER — Ambulatory Visit (HOSPITAL_COMMUNITY): Payer: Medicare Other | Attending: Internal Medicine

## 2013-02-20 DIAGNOSIS — R600 Localized edema: Secondary | ICD-10-CM

## 2013-02-20 DIAGNOSIS — I2510 Atherosclerotic heart disease of native coronary artery without angina pectoris: Secondary | ICD-10-CM | POA: Insufficient documentation

## 2013-02-20 DIAGNOSIS — E785 Hyperlipidemia, unspecified: Secondary | ICD-10-CM | POA: Insufficient documentation

## 2013-02-20 DIAGNOSIS — R609 Edema, unspecified: Secondary | ICD-10-CM | POA: Insufficient documentation

## 2013-02-20 DIAGNOSIS — I10 Essential (primary) hypertension: Secondary | ICD-10-CM | POA: Insufficient documentation

## 2013-02-20 DIAGNOSIS — M79609 Pain in unspecified limb: Secondary | ICD-10-CM | POA: Insufficient documentation

## 2013-02-20 DIAGNOSIS — M7989 Other specified soft tissue disorders: Secondary | ICD-10-CM | POA: Insufficient documentation

## 2013-03-01 ENCOUNTER — Other Ambulatory Visit: Payer: Self-pay | Admitting: *Deleted

## 2013-03-16 ENCOUNTER — Other Ambulatory Visit: Payer: Self-pay | Admitting: Internal Medicine

## 2013-03-27 ENCOUNTER — Ambulatory Visit (INDEPENDENT_AMBULATORY_CARE_PROVIDER_SITE_OTHER): Payer: Medicare Other | Admitting: Internal Medicine

## 2013-03-27 ENCOUNTER — Encounter: Payer: Self-pay | Admitting: Internal Medicine

## 2013-03-27 ENCOUNTER — Ambulatory Visit: Payer: Medicare Other | Admitting: Internal Medicine

## 2013-03-27 DIAGNOSIS — D649 Anemia, unspecified: Secondary | ICD-10-CM

## 2013-03-27 DIAGNOSIS — I359 Nonrheumatic aortic valve disorder, unspecified: Secondary | ICD-10-CM

## 2013-03-27 DIAGNOSIS — I214 Non-ST elevation (NSTEMI) myocardial infarction: Secondary | ICD-10-CM

## 2013-03-27 DIAGNOSIS — I350 Nonrheumatic aortic (valve) stenosis: Secondary | ICD-10-CM

## 2013-03-27 DIAGNOSIS — I2510 Atherosclerotic heart disease of native coronary artery without angina pectoris: Secondary | ICD-10-CM

## 2013-03-27 DIAGNOSIS — Z789 Other specified health status: Secondary | ICD-10-CM

## 2013-03-27 DIAGNOSIS — E785 Hyperlipidemia, unspecified: Secondary | ICD-10-CM

## 2013-03-27 DIAGNOSIS — I255 Ischemic cardiomyopathy: Secondary | ICD-10-CM

## 2013-03-27 DIAGNOSIS — J189 Pneumonia, unspecified organism: Secondary | ICD-10-CM

## 2013-03-27 DIAGNOSIS — I2589 Other forms of chronic ischemic heart disease: Secondary | ICD-10-CM

## 2013-03-27 DIAGNOSIS — I10 Essential (primary) hypertension: Secondary | ICD-10-CM

## 2013-03-27 DIAGNOSIS — N19 Unspecified kidney failure: Secondary | ICD-10-CM

## 2013-03-27 LAB — BASIC METABOLIC PANEL
BUN: 24 mg/dL — ABNORMAL HIGH (ref 6–23)
CO2: 24 mEq/L (ref 19–32)
Calcium: 9.1 mg/dL (ref 8.4–10.5)
Chloride: 98 mEq/L (ref 96–112)
Creatinine, Ser: 1.4 mg/dL (ref 0.4–1.5)
GFR: 50.53 mL/min — AB (ref >60.00)
Glucose, Bld: 96 mg/dL (ref 70–99)
POTASSIUM: 4 meq/L (ref 3.5–5.1)
SODIUM: 133 meq/L — AB (ref 135–145)

## 2013-03-27 LAB — HEPATIC FUNCTION PANEL
ALBUMIN: 4.1 g/dL (ref 3.5–5.2)
ALT: 15 U/L (ref 0–53)
AST: 24 U/L (ref 0–37)
Alkaline Phosphatase: 96 U/L (ref 39–117)
BILIRUBIN TOTAL: 0.7 mg/dL (ref 0.3–1.2)
Bilirubin, Direct: 0.1 mg/dL (ref 0.0–0.3)
Total Protein: 7.4 g/dL (ref 6.0–8.3)

## 2013-03-27 LAB — LIPID PANEL
Cholesterol: 139 mg/dL (ref 0–200)
HDL: 44.9 mg/dL (ref >39.00)
LDL Cholesterol: 77 mg/dL (ref 0–99)
TRIGLYCERIDES: 88 mg/dL (ref 0.0–149.0)
Total CHOL/HDL Ratio: 3
VLDL: 17.6 mg/dL (ref 0.0–40.0)

## 2013-03-27 NOTE — Assessment & Plan Note (Signed)
Controlled Continue meds 

## 2013-03-27 NOTE — Assessment & Plan Note (Signed)
Lipid Panel     Component Value Date/Time   CHOL 138 10/15/2011 0856   TRIG 120.0 10/15/2011 0856   HDL 45.00 10/15/2011 0856   CHOLHDL 3 10/15/2011 0856   VLDL 24.0 10/15/2011 0856   LDLCALC 69 10/15/2011 0856   Needs f/u labs Check today

## 2013-03-27 NOTE — Progress Notes (Signed)
Pneumonia- no known recurrence  NSTEMI- no recurrent sxs  LE edema  False + HIV test  Reviewed pmh, psh, meds  Ros- fatigue is his main complaint  Reviewed vitals- Elderly male NAD CV- reg rate 3/6 SEM Chest- cta extr- trace pretibial edema Neuro- alert, broad based gait   A/p NSTEMI (non-ST elevated myocardial infarction) No recurrent sxs,  Risk factor modification  ANEMIA Has been stable Will not check today  Pneumonia No recurrent sxs i'll remove from problem list  RENAL FAILURE Lab Results  Component Value Date   CREATININE 1.7* 02/15/2013  CRT has increased- Will recheck   CORONARY ARTERY DISEASE No sxs Will continue risk factor modification  HYPERTENSION Controlled Continue meds  HYPERLIPIDEMIA Lipid Panel     Component Value Date/Time   CHOL 138 10/15/2011 0856   TRIG 120.0 10/15/2011 0856   HDL 45.00 10/15/2011 0856   CHOLHDL 3 10/15/2011 0856   VLDL 24.0 10/15/2011 0856   LDLCALC 69 10/15/2011 0856   Needs f/u labs Check today

## 2013-03-27 NOTE — Assessment & Plan Note (Signed)
No sxs Will continue risk factor modification

## 2013-03-27 NOTE — Assessment & Plan Note (Addendum)
No recurrent sxs,  Risk factor modification

## 2013-03-27 NOTE — Assessment & Plan Note (Signed)
No recurrent sxs i'll remove from problem list

## 2013-03-27 NOTE — Assessment & Plan Note (Signed)
Has been stable Will not check today

## 2013-03-27 NOTE — Progress Notes (Signed)
Pre visit review using our clinic review tool, if applicable. No additional management support is needed unless otherwise documented below in the visit note. 

## 2013-03-27 NOTE — Assessment & Plan Note (Signed)
Lab Results  Component Value Date   CREATININE 1.7* 02/15/2013  CRT has increased- Will recheck

## 2013-03-28 ENCOUNTER — Telehealth: Payer: Self-pay | Admitting: Internal Medicine

## 2013-03-28 NOTE — Telephone Encounter (Signed)
Relevant patient education assigned to patient using Emmi. ° °

## 2013-04-02 ENCOUNTER — Ambulatory Visit: Payer: Medicare Other | Admitting: Internal Medicine

## 2013-05-10 ENCOUNTER — Telehealth: Payer: Self-pay | Admitting: Internal Medicine

## 2013-05-10 MED ORDER — LORAZEPAM 0.5 MG PO TABS: 0.5000 mg | ORAL_TABLET | Freq: Every day | ORAL | Status: DC

## 2013-05-10 NOTE — Telephone Encounter (Signed)
Ok per Dr Swords, rx faxed to [facility] 

## 2013-05-10 NOTE — Telephone Encounter (Signed)
Patient needs prescription for LORazepam (ATIVAN) 0.5 MG tablet faxed to [facility] and he needs a RUSH on it. He claims he tried, but he goofed up the process. Patient's contact number has been verified. Thanks!

## 2013-05-11 ENCOUNTER — Telehealth: Payer: Self-pay | Admitting: Internal Medicine

## 2013-05-11 NOTE — Telephone Encounter (Signed)
Pt would like to know if he can have an appointment with Dr. Cato MulliganSwords before he leaves?  Please advise.

## 2013-05-11 NOTE — Telephone Encounter (Signed)
Please advise 

## 2013-05-14 ENCOUNTER — Telehealth: Payer: Self-pay | Admitting: Internal Medicine

## 2013-05-14 ENCOUNTER — Other Ambulatory Visit: Payer: Self-pay | Admitting: Internal Medicine

## 2013-05-14 MED ORDER — PREDNISONE 5 MG PO TABS: 2.5000 mg | ORAL_TABLET | ORAL | Status: DC

## 2013-05-14 NOTE — Telephone Encounter (Signed)
rx sent in electronically 

## 2013-05-14 NOTE — Telephone Encounter (Signed)
Pt needs new RX for  predniSONE (DELTASONE) 5 MG tablet sent to [facility]

## 2013-05-25 ENCOUNTER — Other Ambulatory Visit: Payer: Self-pay | Admitting: Internal Medicine

## 2013-06-24 ENCOUNTER — Other Ambulatory Visit: Payer: Self-pay | Admitting: Internal Medicine

## 2013-07-14 ENCOUNTER — Other Ambulatory Visit: Payer: Self-pay | Admitting: Internal Medicine

## 2013-08-18 ENCOUNTER — Other Ambulatory Visit: Payer: Self-pay | Admitting: Internal Medicine

## 2013-08-20 ENCOUNTER — Telehealth: Payer: Self-pay | Admitting: Internal Medicine

## 2013-08-20 NOTE — Telephone Encounter (Signed)
Pt is out °

## 2013-08-20 NOTE — Telephone Encounter (Signed)
rx faxed to [facility]

## 2013-08-20 NOTE — Telephone Encounter (Signed)
Pt is needing new rx LORazepam (ATIVAN) 0.5 MG tablet. Sent to [facility].

## 2013-09-05 ENCOUNTER — Encounter: Payer: Self-pay | Admitting: Family Medicine

## 2013-09-05 ENCOUNTER — Ambulatory Visit (INDEPENDENT_AMBULATORY_CARE_PROVIDER_SITE_OTHER): Payer: Medicare Other | Admitting: Family Medicine

## 2013-09-05 VITALS — BP 165/66 | HR 68 | Temp 98.5°F | Wt 158.0 lb

## 2013-09-05 DIAGNOSIS — G894 Chronic pain syndrome: Secondary | ICD-10-CM | POA: Insufficient documentation

## 2013-09-05 DIAGNOSIS — I255 Ischemic cardiomyopathy: Secondary | ICD-10-CM

## 2013-09-05 DIAGNOSIS — I2589 Other forms of chronic ischemic heart disease: Secondary | ICD-10-CM

## 2013-09-05 DIAGNOSIS — I2510 Atherosclerotic heart disease of native coronary artery without angina pectoris: Secondary | ICD-10-CM

## 2013-09-05 DIAGNOSIS — I350 Nonrheumatic aortic (valve) stenosis: Secondary | ICD-10-CM

## 2013-09-05 DIAGNOSIS — I10 Essential (primary) hypertension: Secondary | ICD-10-CM

## 2013-09-05 DIAGNOSIS — Z23 Encounter for immunization: Secondary | ICD-10-CM

## 2013-09-05 DIAGNOSIS — I359 Nonrheumatic aortic valve disorder, unspecified: Secondary | ICD-10-CM

## 2013-09-05 NOTE — Assessment & Plan Note (Signed)
Continue Vytorin and aspirin. Referral to cardiology placed

## 2013-09-05 NOTE — Patient Instructions (Signed)
Wonderful to meet you! Thanks for sharing your story.   Let's see each other back in 1-2 weeks in the afternoon. Make sure to take your blood pressure medicine before you come.   I am referring you to cardiology mainly so you can be plugged in, in case you ever need an aortic valve replacement  My preference would be to not use ativan. Please try 1/2 a tab until I see you next. We could consider perhaps a low dose ambien (although that is not ideal either)

## 2013-09-05 NOTE — Assessment & Plan Note (Signed)
Poorly controlled. Patient did not take medicine this morning but will take when he gets home. Have patient back in one to 2 weeks to recheck but make sure take medicine before visit

## 2013-09-05 NOTE — Assessment & Plan Note (Signed)
Appears euvolemic. Continue lisinopril. Continue Lasix for now with caution given aortic stenosis.

## 2013-09-05 NOTE — Progress Notes (Signed)
[name] Conch, MD Phone: [phone]  Subjective:  Patient presents today to establish care. Chief complaint-noted.   Hypertension BP Readings from Last 3 Encounters:  09/05/13 165/66  02/19/13 124/76  02/15/13 130/68  Compliant with medications-typically yes the patient did not take his medication this morning as he usually does as he was trying to get to appointment ROS-Denies any HA, SOB, blurry vision, LE edema.   Severe Aortic stenosis Ischemic cardiomyopathy EF 40-45% CAD with history CABG s/p STEMI January this year Plan was for patient to follow up with cardiology after hospitalization for pneumonia which led to an NSTEMI. Patient had echo at that time showing EF of 40-55 percent as well as severe aortic stenosis. Patient would not be a good candidate for open heart surgery. Per cardiology notes could consider TAVR if patient were to become symptomatic. Patient does admit to occasional anginal symptoms relieved by nitroglycerin.  Patient has done well on Vytorin, lisinopril, carvedilol, Imdur, Lasix 40 mg daily. ROS- no orthopnea, PND, worsening edema  The following were reviewed and entered/updated in epic: Past Medical History  Diagnosis Date  . Coronary artery disease   . Stroke   . Benign prostatic hypertrophy   . Myocardial infarction   . Shortness of breath   . Dysrhythmia   . Colitis 12/16/2011  . Irregular heart beat   . Pneumonia 02/03/2013  . DIVERTICULOSIS, COLON 06/17/2006   Patient Active Problem List   Diagnosis Date Noted  . NSTEMI (non-ST elevated myocardial infarction) 02/06/2013    Priority: High  . Aortic stenosis 02/06/2013    Priority: High  . Cardiomyopathy, ischemic 02/06/2013    Priority: High  . CKD (chronic kidney disease), stage III 12/25/2008    Priority: High  . CORONARY ARTERY DISEASE 01/30/2008    Priority: High  . BPH (benign prostatic hyperplasia) 02/03/2013    Priority: Medium  . Carotid stenosis 12/08/2011    Priority:  Medium  . GOUT 12/31/2008    Priority: Medium  . ANEMIA 01/30/2008    Priority: Medium  . HYPERLIPIDEMIA 06/17/2006    Priority: Medium  . HYPERTENSION 06/17/2006    Priority: Medium  . Chronic pain syndrome 09/05/2013    Priority: Low  . False positive HIV serology     Priority: Low  . INSOMNIA-SLEEP DISORDER-UNSPEC 11/26/2009    Priority: Low   Past Surgical History  Procedure Laterality Date  . Appendectomy    . Coronary artery bypass graft    . Hemorrhoid surgery    . Knee arthroscopy    . Elbow surgery    . Cataract extraction      x2  . Carotid endarterectomy Left 08-11-04    cea  . Carotid endarterectomy Right 10-08-04    cea    Family History  Problem Relation Age of Onset  . Stroke Father   . Heart attack Father   . Heart attack Mother   . Heart attack Brother   . Peripheral vascular disease Brother     Medications- reviewed and updated Current Outpatient Prescriptions  Medication Sig Dispense Refill  . allopurinol (ZYLOPRIM) 300 MG tablet TAKE 1 TABLET DAILY  90 tablet  3  . aspirin EC 81 MG EC tablet Take 1 tablet (81 mg total) by mouth daily.  30 tablet  0  . carvedilol (COREG) 25 MG tablet TAKE 1 TABLET TWICE A DAY WITH A MEAL  90 tablet  2  . finasteride (PROSCAR) 5 MG tablet Take 5 mg by mouth daily.      Marland Kitchen  furosemide (LASIX) 40 MG tablet TAKE 1 TABLET DAILY  90 tablet  2  . gabapentin (NEURONTIN) 300 MG capsule Take 300-600 mg by mouth 3 (three) times daily. Take 300 mg every morning & 600 mg at bedtime      . isosorbide mononitrate (IMDUR) 60 MG 24 hr tablet TAKE 1 TABLET DAILY  90 tablet  2  . lisinopril (PRINIVIL,ZESTRIL) 20 MG tablet TAKE 1 TABLET DAILY  90 tablet  2  . LORazepam (ATIVAN) 0.5 MG tablet Take one tablet by mouth nightly at bedtime as needed  30 tablet  2  . predniSONE (DELTASONE) 5 MG tablet Take 0.5 tablets (2.5 mg total) by mouth every other day.  23 tablet  3  . tamsulosin (FLOMAX) 0.4 MG CAPS capsule Take 0.4 mg by mouth daily.       Marland Kitchen VYTORIN 10-40 MG per tablet TAKE 1 TABLET NIGHTLY AT BEDTIME  90 tablet  3  . nitroGLYCERIN (NITROSTAT) 0.4 MG SL tablet Place 0.4 mg under the tongue every 5 (five) minutes as needed for chest pain.       No current facility-administered medications for this visit.  gabapentin, prednisone for chronic pain  Allergies-reviewed and updated Allergies  Allergen Reactions  . Nsaids     REACTION: renal failure    History   Social History  . Marital Status: Widowed    Spouse Name: N/A    Number of Children: N/A  . Years of Education: N/A   Social History Main Topics  . Smoking status: Former Smoker -- 1.00 packs/day for 40 years    Types: Cigarettes    Quit date: 01/12/1988  . Smokeless tobacco: Never Used  . Alcohol Use: No  . Drug Use: No  . Sexual Activity: No   Other Topics Concern  . None   Social History Narrative   Widowed for 6 years. Married x2, first wife (committed suicide)and patient had a mentally handicapped son. Destroyed marriage. Married 16 years to second. Daughter from 2nd marriage. No grandkids.       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  Lungs: CTAB no crackles, wheeze, rhonchi Abdomen: soft/nontender/nondistended/normal bowel sounds.  Ext: no edema Skin: warm, dry, no rash   Assessment/Plan:  Aortic stenosis Patient had preferred for primary care to manage his cardiac illness previously. I discussed I would strongly  prefer patient to be plugged in cardiology given severe aortic stenosis. The use of nitroglycerin is not ideal  even though it is rare and I would appreciate cardiology's input on its continued use. Patient asked for my recommendation and considering cardiology was considering TAVR as an option in the future, I referred patient to Dr. Excell Seltzer of [facility] heart care  Cardiomyopathy, ischemic Appears euvolemic. Continue lisinopril. Continue Lasix for now with caution given aortic stenosis.  CORONARY ARTERY DISEASE Continue Vytorin and aspirin. Referral to cardiology placed  HYPERTENSION Poorly controlled. Patient did not  take medicine this morning but will take when he gets home. Have patient back in one to 2 weeks to recheck but make sure take medicine before visit   Orders Placed This Encounter  Procedures  . Ambulatory referral to Cardiology    Referral Priority:  Routine    Referral Type:  Consultation    Referral Reason:  Specialty Services Required    Referred to Provider:  Tonny Bollman, MD    Requested Specialty:  Cardiology    Number of Visits Requested:  1

## 2013-09-05 NOTE — Assessment & Plan Note (Signed)
Patient had preferred for primary care to manage his cardiac illness previously. I discussed I would strongly prefer patient to be plugged in cardiology given severe aortic stenosis. The use of nitroglycerin is not ideal  even though it is rare and I would appreciate cardiology's input on its continued use. Patient asked for my recommendation and considering cardiology was considering TAVR as an option in the future, I referred patient to Dr. Excell Seltzer of [facility] heart care

## 2013-09-20 ENCOUNTER — Encounter: Payer: Self-pay | Admitting: Family Medicine

## 2013-09-20 ENCOUNTER — Ambulatory Visit (INDEPENDENT_AMBULATORY_CARE_PROVIDER_SITE_OTHER): Payer: Medicare Other | Admitting: Family Medicine

## 2013-09-20 VITALS — BP 112/72 | HR 60 | Temp 97.6°F | Wt 160.0 lb

## 2013-09-20 DIAGNOSIS — I10 Essential (primary) hypertension: Secondary | ICD-10-CM

## 2013-09-20 DIAGNOSIS — G4700 Insomnia, unspecified: Secondary | ICD-10-CM

## 2013-09-20 MED ORDER — LORAZEPAM 0.5 MG PO TABS: ORAL_TABLET | ORAL | Status: DC

## 2013-09-20 NOTE — Assessment & Plan Note (Signed)
Blood pressure well controlled when taking medicine before visit. Continue lisinopril, carvedilol, lasix , imdur.

## 2013-09-20 NOTE — Addendum Note (Signed)
Addended by: Shelva Majestic on: 09/20/2013 05:04 PM   Modules accepted: Level of Service

## 2013-09-20 NOTE — Progress Notes (Signed)
Tana Conch, MD Phone: [phone]  Subjective:   [name] is a 80 y.o. year old very pleasant male patient who presents with the following:  Hypertension BP Readings from Last 3 Encounters:  09/20/13 112/72  09/05/13 165/66  02/19/13 124/76  Home BP monitoring-no Compliant with medications-yes without side effects ROS-Denies any CP, HA, SOB, blurry vision, LE edema.   Insomnia Trialed 1/2 pill of ativan and only slept 2-3 hours. On full pill, sleeps 4 hours, wakes 1x but can go right back to sleep.  ROS- No SI HI or vivid dreams  Past Medical History- Patient Active Problem List   Diagnosis Date Noted  . NSTEMI (non-ST elevated myocardial infarction) 02/06/2013    Priority: High  . Aortic stenosis 02/06/2013    Priority: High  . Cardiomyopathy, ischemic 02/06/2013    Priority: High  . CKD (chronic kidney disease), stage III 12/25/2008    Priority: High  . CORONARY ARTERY DISEASE 01/30/2008    Priority: High  . BPH (benign prostatic hyperplasia) 02/03/2013    Priority: Medium  . Carotid stenosis 12/08/2011    Priority: Medium  . GOUT 12/31/2008    Priority: Medium  . ANEMIA 01/30/2008    Priority: Medium  . HYPERLIPIDEMIA 06/17/2006    Priority: Medium  . HYPERTENSION 06/17/2006    Priority: Medium  . Chronic pain syndrome 09/05/2013    Priority: Low  . False positive HIV serology     Priority: Low  . INSOMNIA-SLEEP DISORDER-UNSPEC 11/26/2009    Priority: Low   Medications- reviewed and updated Current Outpatient Prescriptions  Medication Sig Dispense Refill  . allopurinol (ZYLOPRIM) 300 MG tablet TAKE 1 TABLET DAILY  90 tablet  3  . aspirin EC 81 MG EC tablet Take 1 tablet (81 mg total) by mouth daily.  30 tablet  0  . carvedilol (COREG) 25 MG tablet TAKE 1 TABLET TWICE A DAY WITH A MEAL  90 tablet  2  . finasteride (PROSCAR) 5 MG tablet Take 5 mg by mouth daily.      . furosemide (LASIX) 40 MG tablet TAKE 1 TABLET DAILY  90 tablet  2  .  gabapentin (NEURONTIN) 300 MG capsule Take 300-600 mg by mouth 3 (three) times daily. Take 300 mg every morning & 600 mg at bedtime      . isosorbide mononitrate (IMDUR) 60 MG 24 hr tablet TAKE 1 TABLET DAILY  90 tablet  2  . lisinopril (PRINIVIL,ZESTRIL) 20 MG tablet TAKE 1 TABLET DAILY  90 tablet  2  . LORazepam (ATIVAN) 0.5 MG tablet Take one tablet by mouth nightly at bedtime as needed  30 tablet  2  . tamsulosin (FLOMAX) 0.4 MG CAPS capsule Take 0.4 mg by mouth daily.      Marland Kitchen VYTORIN 10-40 MG per tablet TAKE 1 TABLET NIGHTLY AT BEDTIME  90 tablet  3  . nitroGLYCERIN (NITROSTAT) 0.4 MG SL tablet Place 0.4 mg under the tongue every 5 (five) minutes as needed for chest pain.      . predniSONE (DELTASONE) 5 MG tablet Take 0.5 tablets (2.5 mg total) by mouth every other day.  23 tablet  3   No current facility-administered medications for this visit.    Objective: BP 112/72  Pulse 60  Temp(Src) 97.6 F (36.4 C)  Wt 160 lb (72.576 kg) Gen: NAD, resting comfortably in chair HEENT: Mucous membranes are moist. CV: 4/6 SEM LUSB heard best.  Lungs: CTAB no crackles, wheeze, rhonchi Abdomen: soft/nontender/nondistended/normal bowel sounds.  Ext: no edema  Assessment/Plan:  HYPERTENSION Blood pressure well controlled when taking medicine before visit. Continue lisinopril, carvedilol, lasix , imdur.   INSOMNIA-SLEEP DISORDER-UNSPEC Did not tolerate 1/2 dose in regards to sleep (only 2-3 hours of sleep). Will take full 0.5mg  of ativan. Provided 3 month refill. We discussed there are definitely risks but given his stability and good control of insomnia, would continue current medication.    Meds ordered this encounter  Medications  . LORazepam (ATIVAN) 0.5 MG tablet    Sig: Take one tablet by mouth nightly at bedtime as needed    Dispense:  30 tablet    Refill:  2

## 2013-09-20 NOTE — Patient Instructions (Signed)
Blood Pressure looks great on the medicine. Continue current dose.   I am sorry the 1/2 dose ativan did not work for you. I gave you a refill and since you have done so well, let's continue the current dose.

## 2013-09-20 NOTE — Assessment & Plan Note (Addendum)
Did not tolerate 1/2 dose in regards to sleep (only 2-3 hours of sleep). Will take full 0.5mg  of ativan. Provided 3 month refill. We discussed there are definitely risks but given his stability and good control of insomnia, would continue current medication.

## 2013-10-05 ENCOUNTER — Encounter: Payer: Self-pay | Admitting: Cardiology

## 2013-10-05 ENCOUNTER — Ambulatory Visit (INDEPENDENT_AMBULATORY_CARE_PROVIDER_SITE_OTHER): Payer: Medicare Other | Admitting: Cardiology

## 2013-10-05 VITALS — BP 142/76 | HR 50 | Ht 72.0 in | Wt 163.0 lb

## 2013-10-05 DIAGNOSIS — I491 Atrial premature depolarization: Secondary | ICD-10-CM

## 2013-10-05 DIAGNOSIS — I2510 Atherosclerotic heart disease of native coronary artery without angina pectoris: Secondary | ICD-10-CM | POA: Insufficient documentation

## 2013-10-05 DIAGNOSIS — I359 Nonrheumatic aortic valve disorder, unspecified: Secondary | ICD-10-CM

## 2013-10-05 DIAGNOSIS — I350 Nonrheumatic aortic (valve) stenosis: Secondary | ICD-10-CM

## 2013-10-05 DIAGNOSIS — I739 Peripheral vascular disease, unspecified: Secondary | ICD-10-CM

## 2013-10-05 NOTE — Patient Instructions (Signed)
The current medical regimen is effective;  continue present plan and medications.  Follow up in 6 months with Dr. Skains.  You will receive a letter in the mail 2 months before you are due.  Please call us when you receive this letter to schedule your follow up appointment.  

## 2013-10-05 NOTE — Progress Notes (Signed)
[address] [place], [place]  [address] Phone: [phone] Fax:  [phone]  Date:  10/05/2013   ID:  [name], DOB [birth year], MRN [id number]  PCP:  Tana Conch, MD   History of Present Illness: [name] is a 80 y.o. male with severe aortic stenosis, coronary artery disease with history of bypass surgery in 2006 status post demand ischemia in January here for followup. Aortic stenosis was noted as severe on echocardiogram in January of 2015 with mean gradient of 40 mm mercury and ejection fraction was also reduced at 40-45%.   I have reviewed his hospital records from January of 2015. He had a small elevated troponin of 0.59, likely demand ischemia in the setting of pneumonia and was found to have severe aortic stenosis. Dr. Clifton James rounded on him and noted that he would be a possible candidate for TAVR in the future if he becomes symptomatic. Following longitudinally as outpatient.   Wt Readings from Last 3 Encounters:  10/05/13 163 lb (73.936 kg)  09/20/13 160 lb (72.576 kg)  09/05/13 158 lb (71.668 kg)     Past Medical History  Diagnosis Date  . Coronary artery disease   . Stroke   . Benign prostatic hypertrophy   . Myocardial infarction   . Shortness of breath   . Dysrhythmia   . Colitis 12/16/2011  . Irregular heart beat   . Pneumonia 02/03/2013  . DIVERTICULOSIS, COLON 06/17/2006    Past Surgical History  Procedure Laterality Date  . Appendectomy    . Coronary artery bypass graft    . Hemorrhoid surgery    . Knee arthroscopy    . Elbow surgery    . Cataract extraction      x2  . Carotid endarterectomy Left 08-11-04    cea  . Carotid endarterectomy Right 10-08-04    cea    Current Outpatient Prescriptions  Medication Sig Dispense Refill  . allopurinol (ZYLOPRIM) 300 MG tablet TAKE 1 TABLET DAILY  90 tablet  3  . aspirin EC 81 MG EC tablet Take 1 tablet (81 mg total) by mouth daily.  30 tablet  0  . carvedilol (COREG) 25 MG  tablet TAKE 1 TABLET TWICE A DAY WITH A MEAL  90 tablet  2  . finasteride (PROSCAR) 5 MG tablet Take 5 mg by mouth daily.      . furosemide (LASIX) 40 MG tablet TAKE 1 TABLET DAILY  90 tablet  2  . gabapentin (NEURONTIN) 300 MG capsule Take 300-600 mg by mouth 3 (three) times daily. Take 300 mg every morning & 600 mg at bedtime      . isosorbide mononitrate (IMDUR) 60 MG 24 hr tablet TAKE 1 TABLET DAILY  90 tablet  2  . lisinopril (PRINIVIL,ZESTRIL) 20 MG tablet TAKE 1 TABLET DAILY  90 tablet  2  . LORazepam (ATIVAN) 0.5 MG tablet Take one tablet by mouth nightly at bedtime as needed  30 tablet  2  . nitroGLYCERIN (NITROSTAT) 0.4 MG SL tablet Place 0.4 mg under the tongue every 5 (five) minutes as needed for chest pain.      . predniSONE (DELTASONE) 5 MG tablet Take 0.5 tablets (2.5 mg total) by mouth every other day.  23 tablet  3  . tamsulosin (FLOMAX) 0.4 MG CAPS capsule Take 0.4 mg by mouth daily.      Marland Kitchen VYTORIN 10-40 MG per tablet TAKE 1 TABLET NIGHTLY AT BEDTIME  90  tablet  3   No current facility-administered medications for this visit.    Allergies:    Allergies  Allergen Reactions  . Nsaids     REACTION: renal failure    Social History:  The patient  reports that he quit smoking about 25 years ago. His smoking use included Cigarettes. He has a 40 pack-year smoking history. He has never used smokeless tobacco. He reports that he does not drink alcohol or use illicit drugs.   Family History  Problem Relation Age of Onset  . Stroke Father   . Heart attack Father   . Heart attack Mother   . Heart attack Brother   . Peripheral vascular disease Brother     ROS:  Please see the history of present illness.   Denies any chest pain, fevers, chills, orthopnea, PND, significant shortness of breath   All other systems reviewed and negative.   PHYSICAL EXAM: VS:  BP 142/76  Pulse 50  Ht 6' (1.829 m)  Wt 163 lb (73.936 kg)  BMI 22.10 kg/m2 Well nourished, well developed, in no  acute distress, talkative, pleasant.  HEENT: normal, [place]/AT, EOMI Neck: no JVD, +bilat bruit Cardiac:  normal S1, S2; RRR; 3/6 SEM murmur Lungs:  clear to auscultation bilaterally, no wheezing, rhonchi or rales Abd: soft, nontender, no hepatomegaly, no bruits Ext: no edema, decreased distal pulses Skin: warm and dry GU: deferred Neuro: no focal abnormalities noted, AAO x 3  EKG:  Previously interpreted as atrial fibrillation however it is likely sinus rhythm with frequent PACs. Dr. Rennie Plowman previously seen in consultation during his January hospitalization and noted telemetry strips.    Echocardiogram: 1/15-severe aortic stenosis, mean gradient 40 mm mercury, EF 40-45%. Previously, EF 40-45% in 2006.  ASSESSMENT AND PLAN:  1. Severe or stenosis-remains asymptomatic. We will monitor closely. In the future, could be a TAVR candidate.  2. Coronary artery disease-history of bypass surgery. 2006. Mild troponin elevation, likely demand ischemia in the setting of pneumonia in January 2015. Overall he has been doing well. 3. Possible atrial fibrillation in January-I agree with Dr. Mayford Knife that this is likely sinus rhythm with PACs. He has not been on anticoagulation. Sounds regular today. 4. Peripheral vascular disease-carotid artery disease bilateral carotid artery revascularization with subclavian stenting, left. Asymptomatic. 5. 6 month follow up.  Signed, Donato Schultz, MD [facility]  10/05/2013 1:30 PM

## 2013-12-20 ENCOUNTER — Ambulatory Visit (INDEPENDENT_AMBULATORY_CARE_PROVIDER_SITE_OTHER): Payer: Medicare Other | Admitting: Family Medicine

## 2013-12-20 ENCOUNTER — Encounter: Payer: Self-pay | Admitting: Family Medicine

## 2013-12-20 VITALS — BP 136/70 | HR 58 | Temp 97.8°F | Wt 161.0 lb

## 2013-12-20 DIAGNOSIS — I25119 Atherosclerotic heart disease of native coronary artery with unspecified angina pectoris: Secondary | ICD-10-CM

## 2013-12-20 DIAGNOSIS — I10 Essential (primary) hypertension: Secondary | ICD-10-CM

## 2013-12-20 DIAGNOSIS — I350 Nonrheumatic aortic (valve) stenosis: Secondary | ICD-10-CM

## 2013-12-20 NOTE — Assessment & Plan Note (Signed)
Well controlled on Lisinopril 20mg , carvedilol 25mg , lasix 40mg , imdur 60mg 

## 2013-12-20 NOTE — Progress Notes (Signed)
Pre visit review using our clinic review tool, if applicable. No additional management support is needed unless otherwise documented below in the visit note. 

## 2013-12-20 NOTE — Patient Instructions (Signed)
Great to see you!   See you back around your [birth year].   Your blood pressure looks great. Glad your nitroglycerin use is not increasing. Obviously let us know if this were to happen.

## 2013-12-20 NOTE — Assessment & Plan Note (Signed)
Patient has seen cardiology in interim since our last meeting. Dr. Anne FuSkains planned for 6 month follow up with consideration of TAVR in the future. Stated patient asymptomatic. Assume chest pain is thought to be anginal and not due to aortic stenosis. Encouraged patient to follow up with cardiology in 6 months but me in 3 months.

## 2013-12-20 NOTE — Progress Notes (Signed)
Tana ConchStephen Deryck Hippler, MD Phone: [phone]  Subjective:   [name] CurtisBob E Chambers is a 80 y.o. year old very pleasant male patient who presents with the following:  Aortic Stenosis- severe but stable CAD- symptomatic but stable 3-4 nitroglycerin since last visit on cold wet days. This is average for him around 1 a month and not increasing. Denies shortness of breath with episodes. Not always exertional. Saw cardiology in September who recommended 6 month follow up for both.  ROS- no syncope. No lightheadedness  Hypertension-well controlled  BP Readings from Last 3 Encounters:  12/20/13 136/70  10/05/13 142/76  09/20/13 112/72  Home BP monitoring-no Compliant with medications-yes without side effects ROS-Denies any  HA, blurry vision, LE edema changes  Past Medical History- Patient Active Problem List   Diagnosis Date Noted  . NSTEMI (non-ST elevated myocardial infarction) 02/06/2013    Priority: High  . Aortic stenosis 02/06/2013    Priority: High  . Cardiomyopathy, ischemic 02/06/2013    Priority: High  . CKD (chronic kidney disease), stage III 12/25/2008    Priority: High  . CAD (coronary artery disease) 01/30/2008    Priority: High  . BPH (benign prostatic hyperplasia) 02/03/2013    Priority: Medium  . Carotid stenosis 12/08/2011    Priority: Medium  . GOUT 12/31/2008    Priority: Medium  . ANEMIA 01/30/2008    Priority: Medium  . HYPERLIPIDEMIA 06/17/2006    Priority: Medium  . Essential hypertension 06/17/2006    Priority: Medium  . Chronic pain syndrome 09/05/2013    Priority: Low  . False positive HIV serology     Priority: Low  . INSOMNIA-SLEEP DISORDER-UNSPEC 11/26/2009    Priority: Low  . Severe aortic stenosis 10/05/2013  . PVD (peripheral vascular disease) 10/05/2013  . PAC (premature atrial contraction) 10/05/2013  . Atherosclerosis of native coronary artery of native heart without angina pectoris 10/05/2013   Medications- reviewed and updated Current  Outpatient Prescriptions  Medication Sig Dispense Refill  . allopurinol (ZYLOPRIM) 300 MG tablet TAKE 1 TABLET DAILY 90 tablet 3  . aspirin EC 81 MG EC tablet Take 1 tablet (81 mg total) by mouth daily. 30 tablet 0  . carvedilol (COREG) 25 MG tablet TAKE 1 TABLET TWICE A DAY WITH A MEAL 90 tablet 2  . finasteride (PROSCAR) 5 MG tablet Take 5 mg by mouth daily.    . furosemide (LASIX) 40 MG tablet TAKE 1 TABLET DAILY 90 tablet 2  . gabapentin (NEURONTIN) 300 MG capsule Take 300-600 mg by mouth 3 (three) times daily. Take 300 mg every morning & 600 mg at bedtime    . isosorbide mononitrate (IMDUR) 60 MG 24 hr tablet TAKE 1 TABLET DAILY 90 tablet 2  . lisinopril (PRINIVIL,ZESTRIL) 20 MG tablet TAKE 1 TABLET DAILY 90 tablet 2  . LORazepam (ATIVAN) 0.5 MG tablet Take one tablet by mouth nightly at bedtime as needed 30 tablet 2  . nitroGLYCERIN (NITROSTAT) 0.4 MG SL tablet Place 0.4 mg under the tongue every 5 (five) minutes as needed for chest pain.    . predniSONE (DELTASONE) 5 MG tablet Take 0.5 tablets (2.5 mg total) by mouth every other day. 23 tablet 3  . tamsulosin (FLOMAX) 0.4 MG CAPS capsule Take 0.4 mg by mouth daily.    Marland Kitchen. VYTORIN 10-40 MG per tablet TAKE 1 TABLET NIGHTLY AT BEDTIME 90 tablet 3   No current facility-administered medications for this visit.    Objective: BP 136/70 mmHg  Pulse 58  Temp(Src) 97.8 F (36.6 C) (  Oral)  Wt 161 lb (73.029 kg)  SpO2 97% Gen: NAD, resting comfortably in chair, appears stated age CV: RRR 4/6 SEM best heard at RUSB but heard throughout Lungs: CTAB no crackles, wheeze, rhonchi Abdomen: soft/nontender/nondistended/normal bowel sounds.  Ext: trace edema Skin: warm, dry Neuro: grossly normal, moves all extremities  Assessment/Plan:  Aortic stenosis Patient has seen cardiology in interim since our last meeting. Dr. Anne FuSkains planned for 6 month follow up with consideration of TAVR in the future. Stated patient asymptomatic. Assume chest pain is  thought to be anginal and not due to aortic stenosis. Encouraged patient to follow up with cardiology in 6 months but me in 3 months.   CAD (coronary artery disease) Stable Angina with nitroglycerin around once per month. Continue aspirin and vytorin. 3 month follow up. Discussed reasons for follow up. Assumed this use is associated with angina and not aortic stenosis severe. Also continue imdur.   Essential hypertension Well controlled on Lisinopril 20mg , carvedilol 25mg , lasix 40mg , imdur 60mg   Return precautions advised with planned 3 month follow up.

## 2013-12-20 NOTE — Assessment & Plan Note (Addendum)
Stable Angina with nitroglycerin around once per month. Continue aspirin and vytorin. 3 month follow up. Discussed reasons for follow up. Assumed this use is associated with angina and not aortic stenosis severe. Also continue imdur.

## 2013-12-21 ENCOUNTER — Other Ambulatory Visit: Payer: Self-pay | Admitting: Family Medicine

## 2013-12-21 NOTE — Telephone Encounter (Signed)
Is this ok to refill?  

## 2013-12-25 NOTE — Telephone Encounter (Signed)
Is this ok to refill?  

## 2013-12-25 NOTE — Telephone Encounter (Signed)
Yes with 5 refills 

## 2013-12-31 ENCOUNTER — Encounter: Payer: Self-pay | Admitting: Family

## 2014-01-01 ENCOUNTER — Ambulatory Visit (HOSPITAL_COMMUNITY)
Admission: RE | Admit: 2014-01-01 | Discharge: 2014-01-01 | Disposition: A | Discharge status: Home / Self Care | Payer: Medicare Other | Source: Ambulatory Visit | Attending: Family | Admitting: Family

## 2014-01-01 ENCOUNTER — Other Ambulatory Visit: Payer: Self-pay

## 2014-01-01 ENCOUNTER — Ambulatory Visit (INDEPENDENT_AMBULATORY_CARE_PROVIDER_SITE_OTHER): Payer: Medicare Other | Admitting: Family

## 2014-01-01 ENCOUNTER — Encounter: Payer: Self-pay | Admitting: Family

## 2014-01-01 VITALS — BP 151/80 | HR 51 | Resp 16 | Ht 72.0 in | Wt 160.0 lb

## 2014-01-01 DIAGNOSIS — Z48812 Encounter for surgical aftercare following surgery on the circulatory system: Secondary | ICD-10-CM | POA: Insufficient documentation

## 2014-01-01 DIAGNOSIS — I2510 Atherosclerotic heart disease of native coronary artery without angina pectoris: Secondary | ICD-10-CM

## 2014-01-01 DIAGNOSIS — I6529 Occlusion and stenosis of unspecified carotid artery: Secondary | ICD-10-CM

## 2014-01-01 DIAGNOSIS — Z9889 Other specified postprocedural states: Secondary | ICD-10-CM

## 2014-01-01 DIAGNOSIS — I6523 Occlusion and stenosis of bilateral carotid arteries: Secondary | ICD-10-CM | POA: Diagnosis not present

## 2014-01-01 NOTE — Patient Instructions (Signed)
Stroke Prevention Some medical conditions and behaviors are associated with an increased chance of having a stroke. You may prevent a stroke by making healthy choices and managing medical conditions. HOW CAN I REDUCE MY RISK OF HAVING A STROKE?   Stay physically active. Get at least 30 minutes of activity on most or all days.  Do not smoke. It may also be helpful to avoid exposure to secondhand smoke.  Limit alcohol use. Moderate alcohol use is considered to be:  No more than 2 drinks per day for men.  No more than 1 drink per day for nonpregnant women.  Eat healthy foods. This involves:  Eating 5 or more servings of fruits and vegetables a day.  Making dietary changes that address high blood pressure (hypertension), high cholesterol, diabetes, or obesity.  Manage your cholesterol levels.  Making food choices that are high in fiber and low in saturated fat, trans fat, and cholesterol may control cholesterol levels.  Take any prescribed medicines to control cholesterol as directed by your health care provider.  Manage your diabetes.  Controlling your carbohydrate and sugar intake is recommended to manage diabetes.  Take any prescribed medicines to control diabetes as directed by your health care provider.  Control your hypertension.  Making food choices that are low in salt (sodium), saturated fat, trans fat, and cholesterol is recommended to manage hypertension.  Take any prescribed medicines to control hypertension as directed by your health care provider.  Maintain a healthy weight.  Reducing calorie intake and making food choices that are low in sodium, saturated fat, trans fat, and cholesterol are recommended to manage weight.  Stop drug abuse.  Avoid taking birth control pills.  Talk to your health care provider about the risks of taking birth control pills if you are over 35 years old, smoke, get migraines, or have ever had a blood clot.  Get evaluated for sleep  disorders (sleep apnea).  Talk to your health care provider about getting a sleep evaluation if you snore a lot or have excessive sleepiness.  Take medicines only as directed by your health care provider.  For some people, aspirin or blood thinners (anticoagulants) are helpful in reducing the risk of forming abnormal blood clots that can lead to stroke. If you have the irregular heart rhythm of atrial fibrillation, you should be on a blood thinner unless there is a good reason you cannot take them.  Understand all your medicine instructions.  Make sure that other conditions (such as anemia or atherosclerosis) are addressed. SEEK IMMEDIATE MEDICAL CARE IF:   You have sudden weakness or numbness of the face, arm, or leg, especially on one side of the body.  Your face or eyelid droops to one side.  You have sudden confusion.  You have trouble speaking (aphasia) or understanding.  You have sudden trouble seeing in one or both eyes.  You have sudden trouble walking.  You have dizziness.  You have a loss of balance or coordination.  You have a sudden, severe headache with no known cause.  You have new chest pain or an irregular heartbeat. Any of these symptoms may represent a serious problem that is an emergency. Do not wait to see if the symptoms will go away. Get medical help at once. Call your local emergency services (911 in [place] Do not drive yourself to the hospital. Document Released: 02/05/2004 Document Revised: 05/14/2013 Document Reviewed: 06/30/2012 [employer] Patient Information 2015 [employer]. This information is not intended to replace advice given   to you by your health care provider. Make sure you discuss any questions you have with your health care provider.  

## 2014-01-01 NOTE — Progress Notes (Signed)
Established Carotid Patient   History of Present Illness  [name] is a 80 y.o. male patient of Dr. Arbie Cookey who is status post bilateral staged CEAs in 2006 by Dr. Madilyn Fireman.   Patient has Positive history of TIA symptom as manifested by right arm weakness, 2 episodes, both in the 1970's, no TIA or stroke symptoms since then. The patient denies amaurosis fugax or monocular blindness. The patient denies facial drooping. Pt. denies hemiplegia. The patient denies receptive or expressive aphasia.   Pt denies any steal symptoms in either UE, denies dizziness.   Pt denies New Medical or Surgical History.  Pt Diabetic: No Pt smoker: former smoker, quit 1990  Pt meds include: Statin : Yes Betablocker: Yes ASA: No, pt states his PCP stopped the ASA that he was taking Other anticoagulants/antiplatelets: no   Past Medical History  Diagnosis Date  . Coronary artery disease   . Stroke   . Benign prostatic hypertrophy   . Myocardial infarction   . Shortness of breath   . Dysrhythmia   . Colitis 12/16/2011  . Irregular heart beat   . Pneumonia 02/03/2013  . DIVERTICULOSIS, COLON 06/17/2006    Social History History  Substance Use Topics  . Smoking status: Former Smoker -- 1.00 packs/day for 40 years    Types: Cigarettes    Quit date: 01/12/1988  . Smokeless tobacco: Never Used  . Alcohol Use: No    Family History Family History  Problem Relation Age of Onset  . Stroke Father   . Heart attack Father   . Heart attack Mother   . Heart attack Brother   . Peripheral vascular disease Brother     Surgical History Past Surgical History  Procedure Laterality Date  . Appendectomy    . Coronary artery bypass graft    . Hemorrhoid surgery    . Knee arthroscopy    . Elbow surgery    . Cataract extraction      x2  . Carotid endarterectomy Left 08-11-04    cea  . Carotid endarterectomy Right 10-08-04    cea    Allergies  Allergen Reactions  . Nsaids     REACTION: renal  failure    Current Outpatient Prescriptions  Medication Sig Dispense Refill  . allopurinol (ZYLOPRIM) 300 MG tablet TAKE 1 TABLET DAILY 90 tablet 3  . aspirin EC 81 MG EC tablet Take 1 tablet (81 mg total) by mouth daily. 30 tablet 0  . carvedilol (COREG) 25 MG tablet TAKE 1 TABLET TWICE A DAY WITH A MEAL 90 tablet 2  . finasteride (PROSCAR) 5 MG tablet Take 5 mg by mouth daily.    . furosemide (LASIX) 40 MG tablet TAKE 1 TABLET DAILY 90 tablet 2  . gabapentin (NEURONTIN) 300 MG capsule Take 300-600 mg by mouth 3 (three) times daily. Take 300 mg every morning & 600 mg at bedtime    . isosorbide mononitrate (IMDUR) 60 MG 24 hr tablet TAKE 1 TABLET DAILY 90 tablet 2  . lisinopril (PRINIVIL,ZESTRIL) 20 MG tablet TAKE 1 TABLET DAILY 90 tablet 2  . LORazepam (ATIVAN) 0.5 MG tablet TAKE ONE TABLET BY MOUTH NIGHTLY AT BEDTIME as needed 30 tablet 5  . nitroGLYCERIN (NITROSTAT) 0.4 MG SL tablet Place 0.4 mg under the tongue every 5 (five) minutes as needed for chest pain.    . predniSONE (DELTASONE) 5 MG tablet Take 0.5 tablets (2.5 mg total) by mouth every other day. 23 tablet 3  . tamsulosin (FLOMAX) 0.4  MG CAPS capsule Take 0.4 mg by mouth daily.    Marland Kitchen. VYTORIN 10-40 MG per tablet TAKE 1 TABLET NIGHTLY AT BEDTIME 90 tablet 3   No current facility-administered medications for this visit.    Review of Systems : See HPI for pertinent positives and negatives.  Physical Examination  Filed Vitals:   01/01/14 1218 01/01/14 1221  BP: 154/66 151/80  Pulse: 51 51  Resp:  16  Height:  6' (1.829 m)  Weight:  160 lb (72.576 kg)  SpO2:  99%   Body mass index is 21.7 kg/(m^2).  General: WDWN male in NAD GAIT: slow and deliberate Eyes: PERRLA Pulmonary: CTAB, Negative Rales, Negative rhonchi, & Negative wheezing.  Cardiac: regular Rhythm , Positive high grade Murmur.  VASCULAR EXAM Carotid Bruits Left Right   Transmitted cardiac murmur Transmitted cardiac murmur    Radial pulses are  2+ palpable and equal.      LE Pulses LEFT RIGHT   POPLITEAL not palpable  not palpable    Gastrointestinal: soft, nontender, BS WNL, no r/g, no palpable masses.  Musculoskeletal: Age appropriate muscle atrophy/wasting. M/S 4/5 throughout, Extremities without ischemic changes.  Neurologic: A&O X 3; Appropriate Affect, sensation is normal, Speech is normal CN 2-12 intact, Pain and light touch intact in extremities, Motor exam as listed above.     Non-Invasive Vascular Imaging CAROTID DUPLEX 01/01/2014   CEREBROVASCULAR DUPLEX EVALUATION    INDICATION: Carotid endarterectomy     PREVIOUS INTERVENTION(S): Right carotid endarterectomy on 10/08/04; Left carotid endarterectomy on 08/11/04    DUPLEX EXAM:     RIGHT  LEFT  Peak Systolic Velocities (cm/s) End Diastolic Velocities (cm/s) Plaque LOCATION Peak Systolic Velocities (cm/s) End Diastolic Velocities (cm/s) Plaque  57 8  CCA PROXIMAL 61 8   47 6  CCA MID 63 9 HT  41 6 HT CCA DISTAL 51 8 HT  42 0  ECA 92 0   33 9 HT ICA PROXIMAL 49 14 HT  82 24  ICA MID 145 37   83 15  ICA DISTAL 123 36     Not Calculated ICA / CCA Ratio (PSV) Not Calculated  Antegrade Vertebral Flow Dampened Bidirectional  152 Brachial Systolic Pressure (mmHg) 154  Multiphasic (subclavian artery) Brachial Artery Waveforms Multiphasic (subclavian artery)    Plaque Morphology:  HM = Homogeneous, HT = Heterogeneous, CP = Calcific Plaque, SP = Smooth Plaque, IP = Irregular Plaque     ADDITIONAL FINDINGS: . No significant stenosis of the bilateral external or common carotid arteries. . Increased velocity noted in the left mid to distal internal carotid artery which may be due to vessel tapering or a change in vessel diameter since plaque formations were not adequately visualized.    IMPRESSION: Patent bilateral  carotid endarterectomy sites with less than 40% stenoses of the bilateral proximal internal carotid arteries.    Compared to the previous exam:  No significant change in the bilateral carotid artery velocities noted when compared to the previous exam on 12/22/12.      Assessment: [name] is a 80 y.o. male who who is status post bilateral staged CEAs in 2006. He presents with asymptomatic patent bilateral carotid endarterectomy sites with less than 40% stenoses of the bilateral proximal internal carotid arteries. No significant change in the bilateral carotid artery velocities noted when compared to the previous exam on 12/22/12.  Plan: Follow-up in 1 year with Carotid Duplex.   I discussed in depth with the patient the nature of  atherosclerosis, and emphasized the importance of maximal medical management including strict control of blood pressure, blood glucose, and lipid levels, obtaining regular exercise, and continued cessation of smoking.  The patient is aware that without maximal medical management the underlying atherosclerotic disease process will progress, limiting the benefit of any interventions. The patient was given information about stroke prevention and what symptoms should prompt the patient to seek immediate medical care. Thank you for allowing us to participate in this patient's care.  Charisse MarchSuzanne Nickel, RN, MSN, FNP-C [facility] [place] Office: [phone]  Clinic Physician: Early  01/01/2014 12:28 PM

## 2014-01-21 ENCOUNTER — Telehealth: Payer: Self-pay | Admitting: Family Medicine

## 2014-01-21 MED ORDER — NITROGLYCERIN 0.4 MG SL SUBL: 0.4000 mg | SUBLINGUAL_TABLET | SUBLINGUAL | Status: AC | PRN

## 2014-01-21 NOTE — Telephone Encounter (Signed)
pharm request refill for pt nitroGLYCERIN (NITROSTAT) 0.4 MG SL tablet [facility]

## 2014-01-21 NOTE — Telephone Encounter (Signed)
rx sent in electronically 

## 2014-02-02 ENCOUNTER — Other Ambulatory Visit: Payer: Self-pay | Admitting: Internal Medicine

## 2014-02-07 ENCOUNTER — Other Ambulatory Visit: Payer: Self-pay | Admitting: *Deleted

## 2014-02-07 MED ORDER — GABAPENTIN 300 MG PO CAPS: ORAL_CAPSULE | ORAL | Status: DC

## 2014-02-10 ENCOUNTER — Other Ambulatory Visit: Payer: Self-pay | Admitting: Internal Medicine

## 2014-03-05 ENCOUNTER — Other Ambulatory Visit: Payer: Self-pay | Admitting: Internal Medicine

## 2014-03-14 ENCOUNTER — Encounter: Payer: Self-pay | Admitting: Family Medicine

## 2014-03-14 ENCOUNTER — Ambulatory Visit (INDEPENDENT_AMBULATORY_CARE_PROVIDER_SITE_OTHER): Payer: Medicare Other | Admitting: Family Medicine

## 2014-03-14 VITALS — BP 138/78 | Temp 97.6°F | Wt 162.0 lb

## 2014-03-14 DIAGNOSIS — E785 Hyperlipidemia, unspecified: Secondary | ICD-10-CM

## 2014-03-14 DIAGNOSIS — N183 Chronic kidney disease, stage 3 unspecified: Secondary | ICD-10-CM

## 2014-03-14 DIAGNOSIS — I25119 Atherosclerotic heart disease of native coronary artery with unspecified angina pectoris: Secondary | ICD-10-CM

## 2014-03-14 DIAGNOSIS — I10 Essential (primary) hypertension: Secondary | ICD-10-CM

## 2014-03-14 LAB — COMPREHENSIVE METABOLIC PANEL
ALBUMIN: 4 g/dL (ref 3.5–5.2)
ALK PHOS: 95 U/L (ref 39–117)
ALT: 16 U/L (ref 0–53)
AST: 22 U/L (ref 0–37)
BUN: 30 mg/dL — AB (ref 6–23)
CALCIUM: 9.1 mg/dL (ref 8.4–10.5)
CO2: 28 mEq/L (ref 19–32)
CREATININE: 2.07 mg/dL — AB (ref 0.40–1.50)
Chloride: 105 mEq/L (ref 96–112)
GFR: 32.37 mL/min — ABNORMAL LOW (ref >60.00)
Glucose, Bld: 91 mg/dL (ref 70–99)
POTASSIUM: 4.7 meq/L (ref 3.5–5.1)
Sodium: 138 mEq/L (ref 135–145)
Total Bilirubin: 0.6 mg/dL (ref 0.2–1.2)
Total Protein: 7.4 g/dL (ref 6.0–8.3)

## 2014-03-14 LAB — CBC
HEMATOCRIT: 37.9 % — AB (ref 39.0–52.0)
HEMOGLOBIN: 12.6 g/dL — AB (ref 13.0–17.0)
MCHC: 33.3 g/dL (ref 30.0–36.0)
MCV: 98.2 fl (ref 78.0–100.0)
Platelets: 227 10*3/uL (ref 150.0–400.0)
RBC: 3.86 Mil/uL — ABNORMAL LOW (ref 4.22–5.81)
RDW: 14.8 % (ref 11.5–15.5)
WBC: 8.1 10*3/uL (ref 4.0–10.5)

## 2014-03-14 LAB — LDL CHOLESTEROL, DIRECT: LDL DIRECT: 53 mg/dL

## 2014-03-14 NOTE — Assessment & Plan Note (Signed)
Continue lasix and lisinopril. Check GFR today to assess stability.

## 2014-03-14 NOTE — Assessment & Plan Note (Signed)
Controlled on Lisinopril 20mg , carvedilol 25mg , lasix 40mg , imdur 60mg 

## 2014-03-14 NOTE — Assessment & Plan Note (Signed)
Stable Angina with prn use nitro 1-2x a month. Continue vytorin, asa, carvedilol, imdur.

## 2014-03-14 NOTE — Progress Notes (Signed)
Tana ConchStephen Hunter, MD Phone: [phone]  Subjective:   [name] CurtisBob E Park is a 81 y.o. year old very pleasant male patient who presents with the following:  Hypertension-reasonable control  BP Readings from Last 3 Encounters:  03/14/14 138/78  01/01/14 151/80  12/20/13 136/70  Home BP monitoring-no Compliant with medications-yes without side effects ROS-Denies any HA, blurry vision, LE edema.   CAD- stable angina Nitro once a month approximately. Perhaps twice a month if out in the cold in winter ROS- minimal shortness of breath  CKD III- stable Year since BMET. Discussed importance of BP control.  ROS- no changes in urination pattern  Hyperlipidemia-controlled previously  Lab Results  Component Value Date   LDLCALC 77 03/27/2013  ROS-  No myalgias  Past Medical History- Patient Active Problem List   Diagnosis Date Noted  . NSTEMI (non-ST elevated myocardial infarction) 02/06/2013    Priority: High  . Severe aortic stenosis 02/06/2013    Priority: High  . Cardiomyopathy, ischemic 02/06/2013    Priority: High  . CKD (chronic kidney disease), stage III 12/25/2008    Priority: High  . CAD (coronary artery disease) 01/30/2008    Priority: High  . BPH (benign prostatic hyperplasia) 02/03/2013    Priority: Medium  . Carotid stenosis 12/08/2011    Priority: Medium  . GOUT 12/31/2008    Priority: Medium  . ANEMIA 01/30/2008    Priority: Medium  . Hyperlipidemia 06/17/2006    Priority: Medium  . Essential hypertension 06/17/2006    Priority: Medium  . Chronic pain syndrome 09/05/2013    Priority: Low  . False positive HIV serology     Priority: Low  . INSOMNIA-SLEEP DISORDER-UNSPEC 11/26/2009    Priority: Low  . PVD (peripheral vascular disease) 10/05/2013  . PAC (premature atrial contraction) 10/05/2013   Medications- reviewed and updated Current Outpatient Prescriptions  Medication Sig Dispense Refill  . allopurinol (ZYLOPRIM) 300 MG tablet TAKE 1 TABLET DAILY  90 tablet 0  . aspirin EC 81 MG EC tablet Take 1 tablet (81 mg total) by mouth daily. 30 tablet 0  . carvedilol (COREG) 25 MG tablet TAKE 1 TABLET TWICE A DAY WITH A MEAL 90 tablet 2  . finasteride (PROSCAR) 5 MG tablet Take 5 mg by mouth daily.    . furosemide (LASIX) 40 MG tablet TAKE 1 TABLET DAILY 90 tablet 1  . gabapentin (NEURONTIN) 300 MG capsule Take 300 mg every morning & 600 mg at bedtime 270 capsule 3  . isosorbide mononitrate (IMDUR) 60 MG 24 hr tablet TAKE 1 TABLET DAILY 90 tablet 1  . lisinopril (PRINIVIL,ZESTRIL) 20 MG tablet TAKE 1 TABLET DAILY 90 tablet 1  . LORazepam (ATIVAN) 0.5 MG tablet TAKE ONE TABLET BY MOUTH NIGHTLY AT BEDTIME as needed 30 tablet 5  . nitroGLYCERIN (NITROSTAT) 0.4 MG SL tablet Place 1 tablet (0.4 mg total) under the tongue every 5 (five) minutes as needed for chest pain. 50 tablet 5  . predniSONE (DELTASONE) 5 MG tablet Take 0.5 tablets (2.5 mg total) by mouth every other day. 23 tablet 3  . tamsulosin (FLOMAX) 0.4 MG CAPS capsule Take 0.4 mg by mouth daily.    Marland Kitchen. VYTORIN 10-40 MG per tablet TAKE 1 TABLET NIGHTLY AT BEDTIME 90 tablet 3   No current facility-administered medications for this visit.    Objective: BP 138/78 mmHg  Temp(Src) 97.6 F (36.4 C) (Oral)  Wt 162 lb (73.483 kg) Gen: NAD, resting comfortably, appears stated age CV: RRR, 4/6 SEM heard best LUSB,  no rubs or gallops Lungs: CTAB no crackles, wheeze, rhonchi Abdomen: soft/nontender/nondistended/normal bowel sounds.  Ext: no edema Skin: warm, dry, no rash   Assessment/Plan:  CAD (coronary artery disease) Stable Angina with prn use nitro 1-2x a month. Continue vytorin, asa, carvedilol, imdur.   CKD (chronic kidney disease), stage III Continue lasix and lisinopril. Check GFR today to assess stability.    Essential hypertension Controlled on Lisinopril , carvedilol , lasix , imdur    Hyperlipidemia Controlled previously on vytorin. Update ldl  today   3-4 month follow up. Also noted history of anemia on prior labs and will assess stability  Orders Placed This Encounter  Procedures  . Comprehensive metabolic panel    [place]  . LDL cholesterol, direct    [place]  . CBC    [place]

## 2014-03-14 NOTE — Assessment & Plan Note (Signed)
Controlled previously on vytorin. Update ldl today

## 2014-03-14 NOTE — Patient Instructions (Signed)
Update labs  BP ok on recheck  No changes today  Always wonderful to see you  3-4 months follow up

## 2014-03-31 ENCOUNTER — Other Ambulatory Visit: Payer: Self-pay | Admitting: Internal Medicine

## 2014-04-10 ENCOUNTER — Ambulatory Visit (INDEPENDENT_AMBULATORY_CARE_PROVIDER_SITE_OTHER): Payer: Medicare Other | Admitting: Cardiology

## 2014-04-10 ENCOUNTER — Encounter: Payer: Self-pay | Admitting: Cardiology

## 2014-04-10 VITALS — BP 130/82 | HR 75 | Ht 72.0 in | Wt 161.0 lb

## 2014-04-10 DIAGNOSIS — I350 Nonrheumatic aortic (valve) stenosis: Secondary | ICD-10-CM | POA: Diagnosis not present

## 2014-04-10 DIAGNOSIS — I10 Essential (primary) hypertension: Secondary | ICD-10-CM | POA: Diagnosis not present

## 2014-04-10 DIAGNOSIS — N183 Chronic kidney disease, stage 3 unspecified: Secondary | ICD-10-CM

## 2014-04-10 DIAGNOSIS — I2583 Coronary atherosclerosis due to lipid rich plaque: Secondary | ICD-10-CM

## 2014-04-10 DIAGNOSIS — I491 Atrial premature depolarization: Secondary | ICD-10-CM

## 2014-04-10 DIAGNOSIS — I2510 Atherosclerotic heart disease of native coronary artery without angina pectoris: Secondary | ICD-10-CM | POA: Diagnosis not present

## 2014-04-10 NOTE — Patient Instructions (Signed)
Your physician recommends that you continue on your current medications as directed. Please refer to the Current Medication list given to you today.  Your physician has requested that you have an echocardiogram. Echocardiography is a painless test that uses sound waves to create images of your heart. It provides your doctor with information about the size and shape of your heart and how well your heart's chambers and valves are working. This procedure takes approximately one hour. There are no restrictions for this procedure.  Your physician wants you to follow-up in: 6 months with Dr. Skains. You will receive a reminder letter in the mail two months in advance. If you don't receive a letter, please call our office to schedule the follow-up appointment.  

## 2014-04-10 NOTE — Progress Notes (Signed)
[address] [place], [place]  [address] Phone: [phone] Fax:  [phone]  Date:  04/10/2014   ID:  [name] CurtisBob E Chambers, DOB 08/26/1924, MRN [id number]  PCP:  Tana ConchHUNTER, STEPHEN, MD   History of Present Illness: [name] is a 81 y.o. male (Former Dr. Gabriel RungJoe patient) with severe aortic stenosis, coronary artery disease with history of bypass surgery in 2006 status post demand ischemia in January here for followup. Aortic stenosis was noted as severe on echocardiogram in January of 2015 with mean gradient of 40 mm mercury and ejection fraction was also reduced at 40-45%.   Has taken 2 NTG past year. Pain in neck and chin. No dyspnea. When cold and damp.   Feels like post CABG lost body heat.   I have reviewed his hospital records from January of 2015. He had a small elevated troponin of 0.59, likely demand ischemia in the setting of pneumonia and was found to have severe aortic stenosis. Dr. Clifton JamesMcAlhany rounded on him and noted that he would be a possible candidate for TAVR in the future if he becomes symptomatic. Following longitudinally as outpatient.   Wt Readings from Last 3 Encounters:  04/10/14 161 lb (73.029 kg)  03/14/14 162 lb (73.483 kg)  01/01/14 160 lb (72.576 kg)     Past Medical History  Diagnosis Date  . Coronary artery disease   . Stroke   . Benign prostatic hypertrophy   . Myocardial infarction   . Shortness of breath   . Dysrhythmia   . Colitis 12/16/2011  . Irregular heart beat   . Pneumonia 02/03/2013  . DIVERTICULOSIS, COLON 06/17/2006    Past Surgical History  Procedure Laterality Date  . Appendectomy    . Coronary artery bypass graft    . Hemorrhoid surgery    . Knee arthroscopy    . Elbow surgery    . Cataract extraction      x2  . Carotid endarterectomy Left 08-11-04    cea  . Carotid endarterectomy Right 10-08-04    cea    Current Outpatient Prescriptions  Medication Sig Dispense Refill  . allopurinol (ZYLOPRIM) 300 MG tablet  TAKE 1 TABLET DAILY 90 tablet 0  . aspirin EC 81 MG EC tablet Take 1 tablet (81 mg total) by mouth daily. 30 tablet 0  . carvedilol (COREG) 25 MG tablet TAKE 1 TABLET TWICE A DAY WITH A MEAL 90 tablet 2  . finasteride (PROSCAR) 5 MG tablet Take 5 mg by mouth daily.    . furosemide (LASIX) 40 MG tablet TAKE 1 TABLET DAILY 90 tablet 1  . gabapentin (NEURONTIN) 300 MG capsule Take 300 mg every morning & 600 mg at bedtime 270 capsule 3  . isosorbide mononitrate (IMDUR) 60 MG 24 hr tablet TAKE 1 TABLET DAILY 90 tablet 1  . lisinopril (PRINIVIL,ZESTRIL) 20 MG tablet TAKE 1 TABLET DAILY 90 tablet 1  . LORazepam (ATIVAN) 0.5 MG tablet TAKE ONE TABLET BY MOUTH NIGHTLY AT BEDTIME as needed 30 tablet 5  . nitroGLYCERIN (NITROSTAT) 0.4 MG SL tablet Place 1 tablet (0.4 mg total) under the tongue every 5 (five) minutes as needed for chest pain. 50 tablet 5  . predniSONE (DELTASONE) 5 MG tablet TAKE ONE-HALF (1/2) TABLET (2.5 MG TOTAL) EVERY OTHER DAY 23 tablet 2  . tamsulosin (FLOMAX) 0.4 MG CAPS capsule Take 0.4 mg by mouth daily.    Marland Kitchen. VYTORIN 10-40 MG per tablet TAKE 1 TABLET NIGHTLY AT  BEDTIME 90 tablet 2   No current facility-administered medications for this visit.    Allergies:    Allergies  Allergen Reactions  . Nsaids     REACTION: renal failure    Social History:  The patient  reports that he quit smoking about 26 years ago. His smoking use included Cigarettes. He has a 40 pack-year smoking history. He has never used smokeless tobacco. He reports that he does not drink alcohol or use illicit drugs.   Family History  Problem Relation Age of Onset  . Stroke Father   . Heart attack Father   . Heart attack Mother   . Heart attack Brother   . Peripheral vascular disease Brother     ROS:  Please see the history of present illness.   Denies any chest pain, fevers, chills, orthopnea, PND, significant shortness of breath   All other systems reviewed and negative.   PHYSICAL EXAM: VS:  BP  130/82 mmHg  Pulse 75  Ht 6' (1.829 m)  Wt 161 lb (73.029 kg)  BMI 21.83 kg/m2 Well nourished, well developed, in no acute distress, talkative, pleasant.  HEENT: normal, [place]/AT, EOMI Neck: no JVD, +bilat bruit Cardiac:  normal S1, S2; RRR; 3/6 SEM murmur Lungs:  clear to auscultation bilaterally, no wheezing, rhonchi or rales Abd: soft, nontender, no hepatomegaly, no bruits Ext: no edema, decreased distal pulses Skin: warm and dry GU: deferred Neuro: no focal abnormalities noted, AAO x 3  EKG:  04/10/14-sinus rhythm, sinus arrhythmia, first-degree AV block, right bundle branch block, heart rate 75 bpm Previously interpreted as atrial fibrillation however it is likely sinus rhythm with frequent PACs. Dr. Rennie Plowman previously seen in consultation during his January hospitalization and noted telemetry strips.    Echocardiogram: 1/15-severe aortic stenosis, mean gradient 40 mm mercury, EF 40-45%. Previously, EF 40-45% in 2006.  ASSESSMENT AND PLAN:  1. Severe or stenosis-remains asymptomatic. We will monitor closely. In the future, could be a TAVR candidate.  2. Coronary artery disease-history of bypass surgery. 2006. Mild troponin elevation, likely demand ischemia in the setting of pneumonia in January 2015. Overall he has been doing well. 3. Possible atrial fibrillation in January-I agree with Dr. Mayford Knife that this is likely sinus rhythm with PACs. He has not been on anticoagulation. Sounds regular today. 4. RBBB - chronic.  5. Peripheral vascular disease-carotid artery disease bilateral carotid artery revascularization with subclavian stenting, left. Asymptomatic. 6. 6 month follow up.  Signed, Donato Schultz, MD [facility]  04/10/2014 8:20 AM

## 2014-04-12 ENCOUNTER — Ambulatory Visit (HOSPITAL_COMMUNITY): Payer: Medicare Other | Attending: Cardiology | Admitting: Radiology

## 2014-04-12 DIAGNOSIS — I350 Nonrheumatic aortic (valve) stenosis: Secondary | ICD-10-CM | POA: Diagnosis present

## 2014-04-12 DIAGNOSIS — I359 Nonrheumatic aortic valve disorder, unspecified: Secondary | ICD-10-CM

## 2014-04-12 NOTE — Progress Notes (Signed)
Echocardiogram performed.  

## 2014-04-15 ENCOUNTER — Other Ambulatory Visit: Payer: Self-pay | Admitting: Family Medicine

## 2014-05-14 ENCOUNTER — Other Ambulatory Visit: Payer: Self-pay | Admitting: Dermatology

## 2014-05-29 ENCOUNTER — Other Ambulatory Visit: Payer: Self-pay | Admitting: Orthopedic Surgery

## 2014-05-29 DIAGNOSIS — M542 Cervicalgia: Secondary | ICD-10-CM

## 2014-06-07 ENCOUNTER — Ambulatory Visit
Admission: RE | Admit: 2014-06-07 | Discharge: 2014-06-07 | Disposition: A | Discharge status: Home / Self Care | Payer: Medicare Other | Source: Ambulatory Visit | Attending: Orthopedic Surgery | Admitting: Orthopedic Surgery

## 2014-06-07 DIAGNOSIS — M542 Cervicalgia: Secondary | ICD-10-CM

## 2014-06-18 ENCOUNTER — Other Ambulatory Visit: Payer: Self-pay | Admitting: Family Medicine

## 2014-06-18 NOTE — Telephone Encounter (Signed)
Ok to send

## 2014-06-18 NOTE — Telephone Encounter (Signed)
Ok to fill   thanks

## 2014-07-02 ENCOUNTER — Ambulatory Visit: Payer: Medicare Other | Attending: Orthopedic Surgery | Admitting: Physical Therapy

## 2014-07-02 DIAGNOSIS — R531 Weakness: Secondary | ICD-10-CM | POA: Insufficient documentation

## 2014-07-02 DIAGNOSIS — R293 Abnormal posture: Secondary | ICD-10-CM | POA: Insufficient documentation

## 2014-07-02 DIAGNOSIS — M25512 Pain in left shoulder: Secondary | ICD-10-CM | POA: Insufficient documentation

## 2014-07-02 DIAGNOSIS — M25511 Pain in right shoulder: Secondary | ICD-10-CM | POA: Insufficient documentation

## 2014-07-02 DIAGNOSIS — R2681 Unsteadiness on feet: Secondary | ICD-10-CM | POA: Insufficient documentation

## 2014-07-02 DIAGNOSIS — M542 Cervicalgia: Secondary | ICD-10-CM | POA: Insufficient documentation

## 2014-07-04 ENCOUNTER — Ambulatory Visit: Payer: Medicare Other | Admitting: Physical Therapy

## 2014-07-04 ENCOUNTER — Encounter: Payer: Self-pay | Admitting: Physical Therapy

## 2014-07-04 DIAGNOSIS — M25512 Pain in left shoulder: Secondary | ICD-10-CM

## 2014-07-04 DIAGNOSIS — M542 Cervicalgia: Secondary | ICD-10-CM | POA: Diagnosis present

## 2014-07-04 DIAGNOSIS — R2681 Unsteadiness on feet: Secondary | ICD-10-CM

## 2014-07-04 DIAGNOSIS — M25511 Pain in right shoulder: Secondary | ICD-10-CM

## 2014-07-04 DIAGNOSIS — R293 Abnormal posture: Secondary | ICD-10-CM

## 2014-07-04 DIAGNOSIS — R531 Weakness: Secondary | ICD-10-CM

## 2014-07-04 NOTE — Therapy (Signed)
[facility] [address] [place], [place], [address] Phone: [phone]   Fax:  [phone]  Physical Therapy Evaluation  Patient Details  Name: [name] MRN: [id number] Date of Birth: 15-Sep-1924 Referring Provider:  Shelva Majestic, MD  Encounter Date: 07/04/2014      PT End of Session - 07/04/14 1329    Visit Number 1   Number of Visits 16   Date for PT Re-Evaluation 08/29/14   Authorization Type Medicare   Authorization Time Period 08/29/14   PT Start Time 1230   PT Stop Time 1335   PT Time Calculation (min) 65 min   Activity Tolerance Patient tolerated treatment well   Behavior During Therapy [facility] for tasks assessed/performed      Past Medical History  Diagnosis Date  . Coronary artery disease   . Stroke   . Benign prostatic hypertrophy   . Myocardial infarction   . Shortness of breath   . Dysrhythmia   . Colitis 12/16/2011  . Irregular heart beat   . Pneumonia 02/03/2013  . DIVERTICULOSIS, COLON 06/17/2006    Past Surgical History  Procedure Laterality Date  . Appendectomy    . Coronary artery bypass graft    . Hemorrhoid surgery    . Knee arthroscopy    . Elbow surgery    . Cataract extraction      x2  . Carotid endarterectomy Left 08-11-04    cea  . Carotid endarterectomy Right 10-08-04    cea    There were no vitals filed for this visit.  Visit Diagnosis:  Generalized weakness  Unsteadiness  Pain in neck  Pain in both shoulders  Abnormal posture      Subjective Assessment - 07/04/14 1247    Subjective My arthritis is getting worse and now my pain into my shoulders to elbows into my hands, I am having a hard time moving my neck now.  some times I can't move my neck at all.   Limitations Standing;Walking   How long can you sit comfortably? 15 min is I keep my neck bent   How long can you stand comfortably? 15 min   How long can you walk comfortably? 15 min   Patient Stated Goals I would  like to be able to drive more comfortably and sit at my dinner table and eat without neck pain   Currently in Pain? Yes   Pain Score 7    Pain Location Neck   Pain Orientation Right  Right >left   Pain Descriptors / Indicators Aching;Shooting;Sharp   Pain Type Chronic pain   Pain Onset More than a month ago   Pain Frequency Constant   Aggravating Factors  turning your head, driving, extending my head, difficult to sit at my table and eat.   Pain Relieving Factors , heating pad   Multiple Pain Sites Yes   Pain Score 7   Pain Location Shoulder   Pain Orientation Right;Left   Pain Descriptors / Indicators Aching;Tightness;Shooting   Pain Onset More than a month ago   Pain Frequency Constant   Aggravating Factors  nothing   Pain Relieving Factors heating pad            [facility] PT Assessment - 07/04/14 1254    Assessment   Medical Diagnosis cervical spondylosis   Onset Date/Surgical Date 07/03/04   Hand Dominance Right   Prior Therapy none   Precautions   Precautions None   Restrictions   Weight  Bearing Restrictions No   Balance Screen   Has the patient fallen in the past 6 months No   Has the patient had a decrease in activity level because of a fear of falling?  No   Is the patient reluctant to leave their home because of a fear of falling?  No   Home Environment   Living Environment Private residence   Living Arrangements Alone   Type of Home Apartment   Home Layout One level   Prior Function   Level of Independence Independent   Vocation Retired   IT consultant   Overall Cognitive Status Within Functional Limits for tasks assessed   Observation/Other Assessments   Observations Pt shuffles  when he walks with abnormally forward head,  pt meticulously dressed and irons own clothes   Focus on Therapeutic Outcomes (FOTO)  intake 31%, limiation 69% predicted 54%   Posture/Postural Control   Posture/Postural Control Postural limitations   Postural Limitations Rounded  Shoulders;Forward head;Decreased lumbar lordosis;Posterior pelvic tilt;Flexed trunk   Posture Comments Pt with  right shoulder elevated over let   AROM   Overall AROM Comments Pt with degenerative joints throught out. pt Left hip joint limited   Right Shoulder Flexion 60 Degrees   Right Shoulder ABduction 60 Degrees   Right Shoulder Internal Rotation 15 Degrees  severe crepitus   Right Shoulder External Rotation 15 Degrees  severe crepitus   Left Shoulder Flexion 90 Degrees   Left Shoulder ABduction 80 Degrees   Left Shoulder Internal Rotation 15 Degrees  severe crepitus   Left Shoulder External Rotation 0 Degrees  severe crepitus   Cervical Flexion 15   Cervical Extension 31  ERP   Cervical - Right Side Bend 10  wtih audible cracking   Cervical - Left Side Bend 20   Cervical - Right Rotation 30   Cervical - Left Rotation 35   Strength   Right Shoulder Flexion 3-/5   Right Shoulder ABduction 3-/5   Left Shoulder Flexion 3-/5   Left Shoulder ABduction 3-/5   Right Hand Grip (lbs) 38,35, 38    Left Hand Grip (lbs) 42, 40 40   Right Hip Flexion 4-/5   Right Hip ABduction 2/5   Left Hip Flexion 3-/5   Left Hip ABduction 2/5   Flexibility   Soft Tissue Assessment /Muscle Length yes   Hamstrings 45 to 50 degrees bilaterally   Spurling's   Findings Positive   Side Right   Distraction Test   Findngs Negative   side Right   Comment pt with severe crepitus in joints   Vertebral Artery Test    Findings Negative   Side Right   Ambulation/Gait   Ambulation/Gait Yes   Gait velocity 1.80 ft/ sec                   [facility] Adult PT Treatment/Exercise - 07/04/14 1254    Manual Therapy   Soft tissue mobilization Right suboccipial  and obliques capitus/as well as R SCM          Trigger Point Dry Needling - 07/04/14 1319    Consent Given? Yes   Education Handout Provided Yes   Muscles Treated Upper Body Suboccipitals muscle group;Oblique capitus   Oblique Capitus  Response Twitch response elicited;Palpable increased muscle length  Right side only   SubOccipitals Response Twitch response elicited;Palpable increased muscle length  right side only              PT Education - 07/04/14  1448    Education provided Yes   Education Details Explanation of findings, initial sitting and standing posture, trigger point dry needling precautions and aftercare   Person(s) Educated Patient   Methods Explanation;Demonstration;Verbal cues;Handout   Comprehension Verbalized understanding;Returned demonstration          PT Short Term Goals - 07/04/14 1523    PT SHORT TERM GOAL #1   Title "Demonstrate understanding of proper sitting posture, body mechanics, work ergonomics, and be more conscious of position and posture throughout the day.    Time 4   Period Weeks   Status New   PT SHORT TERM GOAL #2   Title Independent with initial HEP   Time 4   Period Weeks   Status New   PT SHORT TERM GOAL #3   Title "Report pain decrease from  7 /10 to 5  /10. in order to sit and eat breakfast with more comfort   Time 4   Period Weeks   Status New           PT Long Term Goals - 07/04/14 1525    PT LONG TERM GOAL #1   Title "Demonstrate and verbalize techniques to reduce the risk of re-injury including: lifting, posture, body mechanics.    Time 8   Period Weeks   Status New   PT LONG TERM GOAL #2   Title "Pt will be independent with advanced HEP.    Time 8   Period Weeks   Status New   PT LONG TERM GOAL #3   Title "Pain will decrease to 3/10 or less with all functional activities   Time 8   Period Weeks   Status New   PT LONG TERM GOAL #4   Title "Pt will tolerate sitting 1 hour without increased pain to ride in car without increased pain.   Time 8   Period Weeks   Status New   PT LONG TERM GOAL #5   Title "FOTO will improve from 69% limitation   to 54%   indicating improved functional mobility.    Time 8   Period Weeks   Status New                Plan - 07/04/14 1515    Clinical Impression Statement Pt is 81 yo male that has severe degenerative jt dz and cervical spondylosis/stenosis with c/o of increasing neck pain with radiating pain into right elbow.  Pt presents with sympotoms compatible with  radiculopathy due to OA and stenosis of cervical spine.  Pt hs severely limited AROM, generalized weakness, unsteadiness on feel and reduced gait velocity, and iniabilityt to hold head in a postiono of comfort for driving sitting and eating at breakfast table .  Pt  is unable to raise bilatreral arms above head to 80 degrees. due to abnormal psoture. Pt is unsteady on feet and has obvious core weakness.  Pt  lives alone and would like to maximize his functional mobility and  strenght to be able to live alone as long as possiblity.     Pt will benefit from skilled therapeutic intervention in order to improve on the following deficits Abnormal gait;Decreased activity tolerance;Decreased balance;Decreased mobility;Decreased endurance;Decreased range of motion;Decreased strength;Hypomobility;Difficulty walking;Increased fascial restricitons;Impaired flexibility;Impaired UE functional use;Improper body mechanics;Postural dysfunction;Pain   Rehab Potential Good   PT Frequency 2x / week   PT Duration 8 weeks   PT Treatment/Interventions ADLs/Self Care Home Management;Cryotherapy;Electrical Stimulation;Gait training;Stair training;Ultrasound;Traction;Moist Heat;Iontophoresis /ml Dexamethasone;Functional mobility training;Therapeutic exercise;Balance training;Neuromuscular  re-education;Manual techniques;Patient/family education;Passive range of motion;Dry needling;Taping   PT Next Visit Plan Modalities, traction for neck , posture , deeep neck flexor and supine scapular exercises/ basic movement with good posture   PT Home Exercise Plan trigger point dry needling /aftercare and posture sititingand standing,           G-Codes - 07-18-14  1445    Functional Assessment Tool Used FOTO   Functional Limitation Changing and maintaining body position   Changing and Maintaining Body Position Current Status (R6045) At least 60 percent but less than 80 percent impaired, limited or restricted   Changing and Maintaining Body Position Goal Status (W0981) At least 40 percent but less than 60 percent impaired, limited or restricted       Problem List Patient Active Problem List   Diagnosis Date Noted  . PVD (peripheral vascular disease) 10/05/2013  . PAC (premature atrial contraction) 10/05/2013  . Chronic pain syndrome 09/05/2013  . False positive HIV serology   . NSTEMI (non-ST elevated myocardial infarction) 02/06/2013  . Severe aortic stenosis 02/06/2013  . Cardiomyopathy, ischemic 02/06/2013  . BPH (benign prostatic hyperplasia) 02/03/2013  . Carotid stenosis 12/08/2011  . INSOMNIA-SLEEP DISORDER-UNSPEC 11/26/2009  . GOUT 12/31/2008  . CKD (chronic kidney disease), stage III 12/25/2008  . ANEMIA 01/30/2008  . CAD (coronary artery disease) 01/30/2008  . Hyperlipidemia 06/17/2006  . Essential hypertension 06/17/2006   [name] Lah, PT 07-18-14 3:29 PM Phone: [phone] Fax: [phone]  [facility] [address] [place], [place], [address] Phone: [phone]   Fax:  [phone]

## 2014-07-04 NOTE — Patient Instructions (Addendum)
Trigger Point Dry Needling  . What is Trigger Point Dry Needling (DN)? o DN is a physical therapy technique used to treat muscle pain and dysfunction. Specifically, DN helps deactivate muscle trigger points (muscle knots).  o A thin filiform needle is used to penetrate the skin and stimulate the underlying trigger point. The goal is for a local twitch response (LTR) to occur and for the trigger point to relax. No medication of any kind is injected during the procedure.   . What Does Trigger Point Dry Needling Feel Like?  o The procedure feels different for each individual patient. Some patients report that they do not actually feel the needle enter the skin and overall the process is not painful. Very mild bleeding may occur. However, many patients feel a deep cramping in the muscle in which the needle was inserted. This is the local twitch response.   Marland Kitchen How Will I feel after the treatment? o Soreness is normal, and the onset of soreness may not occur for a few hours. Typically this soreness does not last longer than two days.  o Bruising is uncommon, however; ice can be used to decrease any possible bruising.  o In rare cases feeling tired or nauseous after the treatment is normal. In addition, your symptoms may get worse before they get better, this period will typically not last longer than 24 hours.   . What Can I do After My Treatment? o Increase your hydration by drinking more water for the next 24 hours. o You may place ice or heat on the areas treated that have become sore, however, do not use heat on inflamed or bruised areas. Heat often brings more relief post needling. o You can continue your regular activities, but vigorous activity is not recommended initially after the treatment for 24 hours. DN is best combined with other physical therapy such as strengthening, stretching, and other therapies.   Posture Tips DO: - stand tall and erect - keep chin tucked in - keep head and  shoulders in alignment - check posture regularly in mirror or large window - pull head back against headrest in car seat;  Change your position often.  Sit with lumbar support. DON'T: - slouch or slump while watching TV or reading - sit, stand or lie in one position  for too long;  Sitting is especially hard on the spine so if you sit at a desk/use the computer, then stand up often!   Copyright  VHI. All rights reserved.  Posture - Standing   Good posture is important. Avoid slouching and forward head thrust. Maintain curve in low back and align ears over shoul- ders, hips over ankles.  Pull your belly button in toward your back bone.Have your ribs lift like a string attached to your chest to the sky.  Make sure your chin is tucked down.   Copyright  VHI. All rights reserved.  Posture - Sitting   Sit upright, head facing forward. Try using a roll to support lower back. Keep shoulders relaxed, and avoid rounded back. Keep hips level with knees. Avoid crossing legs for long periods. Sit on your sit bones, not your tailbone.    Copyright  VHI. All rights reserved.   [name] Lah, PT 07/04/2014 1:21 PM Phone: [phone] Fax: [phone]

## 2014-07-09 ENCOUNTER — Ambulatory Visit: Payer: Medicare Other | Admitting: Physical Therapy

## 2014-07-09 DIAGNOSIS — M542 Cervicalgia: Secondary | ICD-10-CM

## 2014-07-09 DIAGNOSIS — R293 Abnormal posture: Secondary | ICD-10-CM

## 2014-07-09 DIAGNOSIS — M25511 Pain in right shoulder: Secondary | ICD-10-CM

## 2014-07-09 DIAGNOSIS — R531 Weakness: Secondary | ICD-10-CM

## 2014-07-09 DIAGNOSIS — M25512 Pain in left shoulder: Secondary | ICD-10-CM

## 2014-07-09 NOTE — Therapy (Signed)
[facility] [address] [place], [place], [address] Phone: [phone]   Fax:  [phone]  Physical Therapy Evaluation  Patient Details  Name: [name] MRN: [id number] Date of Birth: 10/14/1924 Referring Provider:  Shelva MajesticHunter, Stephen O, MD  Encounter Date: 07/09/2014      PT End of Session - 07/09/14 1556    Visit Number 2   Number of Visits 16   Date for PT Re-Evaluation 08/29/14   PT Start Time 1330   PT Stop Time 1420   PT Time Calculation (min) 50 min   Activity Tolerance Patient limited by pain   Behavior During Therapy --  extra time required due to patient easily distracted and needs redirection. frequently      Past Medical History  Diagnosis Date  . Coronary artery disease   . Stroke   . Benign prostatic hypertrophy   . Myocardial infarction   . Shortness of breath   . Dysrhythmia   . Colitis 12/16/2011  . Irregular heart beat   . Pneumonia 02/03/2013  . DIVERTICULOSIS, COLON 06/17/2006    Past Surgical History  Procedure Laterality Date  . Appendectomy    . Coronary artery bypass graft    . Hemorrhoid surgery    . Knee arthroscopy    . Elbow surgery    . Cataract extraction      x2  . Carotid endarterectomy Left 08-11-04    cea  . Carotid endarterectomy Right 10-08-04    cea    There were no vitals filed for this visit.  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                       [facility] Adult PT Treatment/Exercise - 07/09/14 1401    Neck Exercises: Seated   Other Seated Exercise deep neck flexor painful sending pain up neck over head.  3 reps   Neck Exercises: Supine   Other Supine Exercise Decompression exercises   decompression, shoulder press, head press added to home exer                PT Education - 07/09/14 1556    Education provided Yes   Education Details decompression   Person(s) Educated Patient   Methods Explanation;Demonstration;Tactile cues;Verbal cues;Handout   Comprehension Verbalized understanding;Returned demonstration          PT Short Term Goals - 07/04/14 1523    PT SHORT TERM GOAL #1   Title "Demonstrate understanding of proper sitting posture, body mechanics, work ergonomics, and be more conscious of position and posture throughout the day.    Time 4   Period Weeks   Status New   PT SHORT TERM GOAL #2   Title Independent with initial HEP   Time 4   Period Weeks   Status New   PT SHORT TERM GOAL #3   Title "Report pain decrease from  7 /10 to 5  /10. in order to sit and eat breakfast with more comfort   Time 4   Period Weeks   Status New           PT Long Term Goals - 07/04/14 1525  PT LONG TERM GOAL #1   Title "Demonstrate and verbalize techniques to reduce the risk of re-injury including: lifting, posture, body mechanics.    Time 8   Period Weeks   Status New   PT LONG TERM GOAL #2   Title "Pt will be independent with advanced HEP.    Time 8   Period Weeks   Status New   PT LONG TERM GOAL #3   Title "Pain will decrease to 3/10 or less with all functional activities   Time 8   Period Weeks   Status New   PT LONG TERM GOAL #4   Title "Pt will tolerate sitting 1 hour without increased pain to ride in car without increased pain.   Time 8   Period Weeks   Status New   PT LONG TERM GOAL #5   Title "FOTO will improve from 69% limitation   to 54%   indicating improved functional mobility.    Time 8   Period Weeks   Status New               Plan - 07/09/14 1558    Clinical Impression Statement Patient has strong feelings about what he can and cannot do and at the same time willing to try some things.  Less pain in neck post  decompression    PT Next Visit Plan review decompression exercises.  ER bands scapular as shoulders will allow.   Consulted and Agree with Plan of Care Patient         Problem List Patient Active Problem List   Diagnosis Date Noted  . PVD (peripheral vascular disease) 10/05/2013  . PAC (premature atrial contraction) 10/05/2013  . Chronic pain syndrome 09/05/2013  . False positive HIV serology   . NSTEMI (non-ST elevated myocardial infarction) 02/06/2013  . Severe aortic stenosis 02/06/2013  . Cardiomyopathy, ischemic 02/06/2013  . BPH (benign prostatic hyperplasia) 02/03/2013  . Carotid stenosis 12/08/2011  . INSOMNIA-SLEEP DISORDER-UNSPEC 11/26/2009  . GOUT 12/31/2008  . CKD (chronic kidney disease), stage III 12/25/2008  . ANEMIA 01/30/2008  . CAD (coronary artery disease) 01/30/2008  . Hyperlipidemia 06/17/2006  . Essential hypertension 06/17/2006    HARRIS,KAREN 07/09/2014, 4:02 PM  [facility] [address] [place], [place], [address] Phone: [phone]   Fax:  [phone]    Liz Beach, PTA 07/09/2014 4:02 PM Phone: [phone] Fax: [phone]

## 2014-07-09 NOTE — Patient Instructions (Signed)
Sitting posture, sleeping posture, transfers in /out of bed discussed.

## 2014-07-11 ENCOUNTER — Ambulatory Visit: Payer: Medicare Other | Admitting: Rehabilitative and Restorative Service Providers"

## 2014-07-11 DIAGNOSIS — M542 Cervicalgia: Secondary | ICD-10-CM | POA: Diagnosis not present

## 2014-07-11 DIAGNOSIS — R293 Abnormal posture: Secondary | ICD-10-CM

## 2014-07-11 NOTE — Therapy (Signed)
[facility] [address] [place], [place], [address] Phone: [phone]   Fax:  [phone]  Physical Therapy Treatment  Patient Details  Name: [name] FinesBob E Marmo MRN: [id number] Date of Birth: 06/25/1924 Referring Provider:  Shelva MajesticHunter, Stephen O, MD  Encounter Date: 07/11/2014      PT End of Session - 07/11/14 1453    Visit Number 3   Number of Visits 16   Date for PT Re-Evaluation 08/29/14   Authorization Type Medicare   Authorization Time Period 08/29/14   PT Start Time 1405   PT Stop Time 1450   PT Time Calculation (min) 45 min      Past Medical History  Diagnosis Date  . Coronary artery disease   . Stroke   . Benign prostatic hypertrophy   . Myocardial infarction   . Shortness of breath   . Dysrhythmia   . Colitis 12/16/2011  . Irregular heart beat   . Pneumonia 02/03/2013  . DIVERTICULOSIS, COLON 06/17/2006    Past Surgical History  Procedure Laterality Date  . Appendectomy    . Coronary artery bypass graft    . Hemorrhoid surgery    . Knee arthroscopy    . Elbow surgery    . Cataract extraction      x2  . Carotid endarterectomy Left 08-11-04    cea  . Carotid endarterectomy Right 10-08-04    cea    There were no vitals filed for this visit.  Visit Diagnosis:  Pain in neck  Abnormal posture      Subjective Assessment - 07/11/14 1421    Subjective I am doing much better. Just look   Currently in Pain? No/denies            [facility] - 07/11/14 0001    Assessment   Medical Diagnosis cervical spondylosis   Onset Date/Surgical Date 07/03/04                     [facility]/Exercise - 07/11/14 0001    Neck Exercises: Seated   Other Seated Exercise Nustep bil UEs level 5 x 5 min   with PT verbal cues for posture   Neck Exercises: Supine   Other Supine Exercise reviewed Decompression exercises; supine chin tucks x 15 with 5 sec hold; chin tuck with gentle suboccipital  distraction    Manual Therapy   Soft tissue mobilization supine SCM STW/strumming, PROM all planes with/wo STW SCM and Upper Trap                  PT Short Term Goals - 07/11/14 1459    PT SHORT TERM GOAL #3   Title "Report pain decrease from  7 /10 to 5  /10. in order to sit and eat breakfast with more comfort   Time 4   Period Weeks   Status On-going           PT Long Term Goals - 07/04/14 1525    PT LONG TERM GOAL #1   Title "Demonstrate and verbalize techniques to reduce the risk of re-injury including: lifting, posture, body mechanics.    Time 8   Period Weeks   Status New   PT LONG TERM GOAL #2   Title "Pt will be independent with advanced HEP.    Time 8   Period Weeks   Status New   PT LONG TERM GOAL #3   Title "Pain will decrease to 3/10 or less  with all functional activities   Time 8   Period Weeks   Status New   PT LONG TERM GOAL #4   Title "Pt will tolerate sitting 1 hour without increased pain to ride in car without increased pain.   Time 8   Period Weeks   Status New   PT LONG TERM GOAL #5   Title "FOTO will improve from 69% limitation   to 54%   indicating improved functional mobility.    Time 8   Period Weeks   Status New               Plan - 07/11/14 1454    Clinical Impression Statement pt reports ROM to be improved after last session; pt hindered specifically with bil rotation R > L; feels catching on R approx C5/6   Pt will benefit from skilled therapeutic intervention in order to improve on the following deficits Abnormal gait;Decreased activity tolerance;Decreased balance;Decreased mobility;Decreased endurance;Decreased range of motion;Decreased strength;Hypomobility;Difficulty walking;Increased fascial restricitons;Impaired flexibility;Impaired UE functional use;Improper body mechanics;Postural dysfunction;Pain   Rehab Potential Good   PT Frequency 2x / week   PT Duration 8 weeks   PT Treatment/Interventions ADLs/Self Care  Home Management;Cryotherapy;Electrical Stimulation;Gait training;Stair training;Ultrasound;Traction;Moist Heat;Iontophoresis /ml Dexamethasone;Functional mobility training;Therapeutic exercise;Balance training;Neuromuscular re-education;Manual techniques;Patient/family education;Passive range of motion;Dry needling;Taping   PT Next Visit Plan assess treatment from this visit; continue manual; [place] to bil Upper trap; ER bands scapular as shoulders will allow   PT Home Exercise Plan trigger point dry needling /aftercare and posture sititingand standing,    Recommended Other Services none; advised pt to heat and perform HEP since he reports no appts for next 1.5 weeks; no pain after tx.        Problem List Patient Active Problem List   Diagnosis Date Noted  . PVD (peripheral vascular disease) 10/05/2013  . PAC (premature atrial contraction) 10/05/2013  . Chronic pain syndrome 09/05/2013  . False positive HIV serology   . NSTEMI (non-ST elevated myocardial infarction) 02/06/2013  . Severe aortic stenosis 02/06/2013  . Cardiomyopathy, ischemic 02/06/2013  . BPH (benign prostatic hyperplasia) 02/03/2013  . Carotid stenosis 12/08/2011  . INSOMNIA-SLEEP DISORDER-UNSPEC 11/26/2009  . GOUT 12/31/2008  . CKD (chronic kidney disease), stage III 12/25/2008  . ANEMIA 01/30/2008  . CAD (coronary artery disease) 01/30/2008  . Hyperlipidemia 06/17/2006  . Essential hypertension 06/17/2006    Thornell Sartorius, PT, DPT 07/11/2014, 3:02 PM  [facility] [address] [place], [place], [address] Phone: [phone]   Fax:  [phone]

## 2014-07-21 ENCOUNTER — Other Ambulatory Visit: Payer: Self-pay | Admitting: Family Medicine

## 2014-07-23 ENCOUNTER — Ambulatory Visit: Payer: Medicare Other | Attending: Orthopedic Surgery | Admitting: Physical Therapy

## 2014-07-23 DIAGNOSIS — R531 Weakness: Secondary | ICD-10-CM | POA: Insufficient documentation

## 2014-07-23 DIAGNOSIS — M542 Cervicalgia: Secondary | ICD-10-CM | POA: Insufficient documentation

## 2014-07-23 DIAGNOSIS — M25511 Pain in right shoulder: Secondary | ICD-10-CM | POA: Insufficient documentation

## 2014-07-23 DIAGNOSIS — R2681 Unsteadiness on feet: Secondary | ICD-10-CM | POA: Insufficient documentation

## 2014-07-23 DIAGNOSIS — M25512 Pain in left shoulder: Secondary | ICD-10-CM | POA: Insufficient documentation

## 2014-07-23 DIAGNOSIS — R293 Abnormal posture: Secondary | ICD-10-CM | POA: Insufficient documentation

## 2014-07-23 NOTE — Therapy (Signed)
[facility] [address] [place], [place], [address] Phone: [phone]   Fax:  [phone]  Physical Therapy Treatment  Patient Details  Name: [name] MRN: [id number] Date of Birth: Mar 07, 1924 Referring Provider:  Shelva Majestic, MD  Encounter Date: 07/23/2014    Past Medical History  Diagnosis Date  . Coronary artery disease   . Stroke   . Benign prostatic hypertrophy   . Myocardial infarction   . Shortness of breath   . Dysrhythmia   . Colitis 12/16/2011  . Irregular heart beat   . Pneumonia 02/03/2013  . DIVERTICULOSIS, COLON 06/17/2006    Past Surgical History  Procedure Laterality Date  . Appendectomy    . Coronary artery bypass graft    . Hemorrhoid surgery    . Knee arthroscopy    . Elbow surgery    . Cataract extraction      x2  . Carotid endarterectomy Left 08-11-04    cea  . Carotid endarterectomy Right 10-08-04    cea    There were no vitals filed for this visit.  Visit Diagnosis:  Pain in neck                                 PT Short Term Goals - 07/11/14 1459    PT SHORT TERM GOAL #3   Title "Report pain decrease from  7 /10 to 5  /10. in order to sit and eat breakfast with more comfort   Time 4   Period Weeks   Status On-going           PT Long Term Goals - 07/04/14 1525    PT LONG TERM GOAL #1   Title "Demonstrate and verbalize techniques to reduce the risk of re-injury including: lifting, posture, body mechanics.    Time 8   Period Weeks   Status New   PT LONG TERM GOAL #2   Title "Pt will be independent with advanced HEP.    Time 8   Period Weeks   Status New   PT LONG TERM GOAL #3   Title "Pain will decrease to 3/10 or less with all functional activities   Time 8   Period Weeks   Status New   PT LONG TERM GOAL #4   Title "Pt will tolerate sitting 1 hour without increased pain to ride in car without increased pain.   Time 8   Period Weeks   Status New   PT LONG TERM GOAL #5   Title "FOTO will improve from 69% limitation   to 54%   indicating improved functional mobility.    Time 8   Period Weeks   Status New               Plan - 07/23/14 1818    PT Next Visit Plan assess treatment from last visit, continue manual; [place] to bil Upper trap; ER bands scapular as shoulders will allow        Problem List Patient Active Problem List   Diagnosis Date Noted  . PVD (peripheral vascular disease) 10/05/2013  . PAC (premature atrial contraction) 10/05/2013  . Chronic pain syndrome 09/05/2013  . False positive HIV serology   . NSTEMI (non-ST elevated myocardial infarction) 02/06/2013  . Severe aortic stenosis 02/06/2013  . Cardiomyopathy, ischemic 02/06/2013  . BPH (benign prostatic hyperplasia) 02/03/2013  . Carotid stenosis 12/08/2011  . INSOMNIA-SLEEP DISORDER-UNSPEC  11/26/2009  . GOUT 12/31/2008  . CKD (chronic kidney disease), stage III 12/25/2008  . ANEMIA 01/30/2008  . CAD (coronary artery disease) 01/30/2008  . Hyperlipidemia 06/17/2006  . Essential hypertension 06/17/2006    [facility] Center[facility]IS,Johnie Stadel 07/23/2014, 6:19 PM  [facility] [address] [place], [place], [address] Phone: [phone]   Fax:  [phone]

## 2014-07-24 ENCOUNTER — Encounter: Payer: Self-pay | Admitting: Adult Health

## 2014-07-24 ENCOUNTER — Ambulatory Visit (INDEPENDENT_AMBULATORY_CARE_PROVIDER_SITE_OTHER): Payer: Medicare Other | Admitting: Adult Health

## 2014-07-24 ENCOUNTER — Telehealth: Payer: Self-pay | Admitting: Family Medicine

## 2014-07-24 VITALS — BP 142/80 | Temp 97.9°F | Ht 72.0 in | Wt 156.8 lb

## 2014-07-24 DIAGNOSIS — I2510 Atherosclerotic heart disease of native coronary artery without angina pectoris: Secondary | ICD-10-CM | POA: Diagnosis not present

## 2014-07-24 DIAGNOSIS — R040 Epistaxis: Secondary | ICD-10-CM

## 2014-07-24 LAB — CBC WITH DIFFERENTIAL/PLATELET
Basophils Absolute: 0.1 10*3/uL (ref 0.0–0.1)
Basophils Relative: 0.7 % (ref 0.0–3.0)
EOS ABS: 0.4 10*3/uL (ref 0.0–0.7)
Eosinophils Relative: 4.4 % (ref 0.0–5.0)
HEMATOCRIT: 40.6 % (ref 39.0–52.0)
HEMOGLOBIN: 13.3 g/dL (ref 13.0–17.0)
Lymphocytes Relative: 24.8 % (ref 12.0–46.0)
Lymphs Abs: 2.3 10*3/uL (ref 0.7–4.0)
MCHC: 32.7 g/dL (ref 30.0–36.0)
MCV: 99.7 fl (ref 78.0–100.0)
Monocytes Absolute: 0.5 10*3/uL (ref 0.1–1.0)
Monocytes Relative: 5.7 % (ref 3.0–12.0)
NEUTROS ABS: 6 10*3/uL (ref 1.4–7.7)
NEUTROS PCT: 64.4 % (ref 43.0–77.0)
Platelets: 210 10*3/uL (ref 150.0–400.0)
RBC: 4.07 Mil/uL — AB (ref 4.22–5.81)
RDW: 15.3 % (ref 11.5–15.5)
WBC: 9.4 10*3/uL (ref 4.0–10.5)

## 2014-07-24 LAB — APTT: aPTT: 28.3 s (ref 23.4–32.7)

## 2014-07-24 LAB — HEPATIC FUNCTION PANEL
ALK PHOS: 82 U/L (ref 39–117)
ALT: 13 U/L (ref 0–53)
AST: 16 U/L (ref 0–37)
Albumin: 4.1 g/dL (ref 3.5–5.2)
BILIRUBIN DIRECT: 0.1 mg/dL (ref 0.0–0.3)
Total Bilirubin: 0.8 mg/dL (ref 0.2–1.2)
Total Protein: 7.4 g/dL (ref 6.0–8.3)

## 2014-07-24 NOTE — Telephone Encounter (Signed)
Noted  

## 2014-07-24 NOTE — Progress Notes (Signed)
Pre visit review using our clinic review tool, if applicable. No additional management support is needed unless otherwise documented below in the visit note. 

## 2014-07-24 NOTE — Telephone Encounter (Signed)
Pt has an appt with Kandee Keenory

## 2014-07-24 NOTE — Patient Instructions (Addendum)
It was a pleasure meeting you today!   I will follow up with you regarding your labs results. In the mean time, stop by the drug store and pick up a bottle of Afrin nasal spray, use that if you have a nose bleed. You can also pick up some saline nose spray and use that during the day to keep your nasal passages moist. Nosebleed Nosebleeds can be caused by many conditions, including trauma, infections, polyps, foreign bodies, dry mucous membranes or climate, medicines, and air conditioning. Most nosebleeds occur in the front of the nose. Because of this location, most nosebleeds can be controlled by pinching the nostrils gently and continuously for at least 10 to 20 minutes. The long, continuous pressure allows enough time for the blood to clot. If pressure is released during that 10 to 20 minute time period, the process may have to be started again. The nosebleed may stop by itself or quit with pressure, or it may need concentrated heating (cautery) or pressure from packing. HOME CARE INSTRUCTIONS   If your nose was packed, try to maintain the pack inside until your health care provider removes it. If a gauze pack was used and it starts to fall out, gently replace it or cut the end off. Do not cut if a balloon catheter was used to pack the nose. Otherwise, do not remove unless instructed.  Avoid blowing your nose for 12 hours after treatment. This could dislodge the pack or clot and start the bleeding again.  If the bleeding starts again, sit up and bend forward, gently pinching the front half of your nose continuously for 20 minutes.  If bleeding was caused by dry mucous membranes, use over-the-counter saline nasal spray or gel. This will keep the mucous membranes moist and allow them to heal. If you must use a lubricant, choose the water-soluble variety. Use it only sparingly and not within several hours of lying down.  Do not use petroleum jelly or mineral oil, as these may drip into the lungs and  cause serious problems.  Maintain humidity in your home by using less air conditioning or by using a humidifier.  Do not use aspirin or medicines which make bleeding more likely. Your health care provider can give you recommendations on this.  Resume normal activities as you are able, but try to avoid straining, lifting, or bending at the waist for several days.  If the nosebleeds become recurrent and the cause is unknown, your health care provider may suggest laboratory tests. SEEK MEDICAL CARE IF: You have a fever. SEEK IMMEDIATE MEDICAL CARE IF:   Bleeding recurs and cannot be controlled.  There is unusual bleeding from or bruising on other parts of the body.  Nosebleeds continue.  There is any worsening of the condition which originally brought you in.  You become light-headed, feel faint, become sweaty, or vomit blood. MAKE SURE YOU:   Understand these instructions.  Will watch your condition.  Will get help right away if you are not doing well or get worse. Document Released: 10/07/2004 Document Revised: 05/14/2013 Document Reviewed: 11/28/2008 [facility] Patient Information 2015 [employer] [place]. This information is not intended to replace advice given to you by your health care provider. Make sure you discuss any questions you have with your health care provider.

## 2014-07-24 NOTE — Progress Notes (Addendum)
Subjective:    Patient ID: [name], male    DOB: 07-15-1924, 81 y.o.   MRN: [id number]  HPI  81 year old Male, is a patient of Dr. Durene Cal. Presents to the office today for "severe nose bleeds"  He had one nose bleed 6 days ago, " it gushed out" lasting about an hour and a half and then another one, one day ago , that lasted from 3 pm to 9 pm. He denies any trauma to the nose. Endorses that his blood pressure is controlled ( does not check it a home)  He has had skin cancer removed from left nare in January.   Not currently taking any anticoagulants. Only 81 mg ASA  Review of Systems  Constitutional: Negative.   HENT: Positive for nosebleeds. Negative for postnasal drip, rhinorrhea and sinus pressure.   Neurological: Negative.   Hematological: Negative.    Past Medical History  Diagnosis Date  . Coronary artery disease   . Stroke   . Benign prostatic hypertrophy   . Myocardial infarction   . Shortness of breath   . Dysrhythmia   . Colitis 12/16/2011  . Irregular heart beat   . Pneumonia 02/03/2013  . DIVERTICULOSIS, COLON 06/17/2006    History   Social History  . Marital Status: Widowed    Spouse Name: N/A  . Number of Children: N/A  . Years of Education: N/A   Occupational History  . Not on file.   Social History Main Topics  . Smoking status: Former Smoker -- 1.00 packs/day for 40 years    Types: Cigarettes    Quit date: 01/12/1988  . Smokeless tobacco: Never Used  . Alcohol Use: No  . Drug Use: No  . Sexual Activity: No   Other Topics Concern  . Not on file   Social History Narrative   Widowed for 6 years. Married x2, first wife (committed suicide)and patient had a mentally handicapped son. Destroyed marriage. Married 16 years to second. Daughter from 2nd marriage. No grandkids.       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 Appendectomy    . Coronary artery bypass graft    . Hemorrhoid surgery    . Knee arthroscopy    . Elbow surgery    . Cataract extraction      x2  . Carotid endarterectomy Left 08-11-04    cea  . Carotid endarterectomy Right 10-08-04    cea    Family History  Problem Relation Age of Onset  . Stroke Father   . Heart attack Father   . Heart attack Mother   . Heart attack Brother   . Peripheral vascular disease Brother     Allergies  Allergen Reactions  . Nsaids     REACTION: renal failure    Current Outpatient Prescriptions on File Prior to Visit  Medication Sig Dispense Refill  . allopurinol (ZYLOPRIM) 300 MG tablet TAKE 1 TABLET DAILY 90 tablet 2  . aspirin EC 81 MG EC tablet Take 1 tablet (81 mg total) by mouth daily. 30 tablet 0  . carvedilol (COREG) 25 MG  tablet TAKE 1 TABLET TWICE A DAY WITH A MEAL 90 tablet 2  . finasteride (PROSCAR) 5 MG tablet Take 5 mg by mouth daily.    . furosemide (LASIX) 40 MG tablet TAKE 1 TABLET DAILY 90 tablet 1  . gabapentin (NEURONTIN) 300 MG capsule Take 300 mg every morning & 600 mg at bedtime 270 capsule 3  . isosorbide mononitrate (IMDUR) 60 MG 24 hr tablet TAKE 1 TABLET DAILY 90 tablet 1  . lisinopril (PRINIVIL,ZESTRIL) 20 MG tablet TAKE 1 TABLET DAILY 90 tablet 2  . LORazepam (ATIVAN) 0.5 MG tablet TAKE ONE TABLET BY MOUTH NIGHTLY AT BEDTIME AS NEEDED 30 tablet 2  . nitroGLYCERIN (NITROSTAT) 0.4 MG SL tablet Place 1 tablet (0.4 mg total) under the tongue every 5 (five) minutes as needed for chest pain. 50 tablet 5  . tamsulosin (FLOMAX) 0.4 MG CAPS capsule Take 0.4 mg by mouth daily.    Marland Kitchen. VYTORIN 10-40 MG per tablet TAKE 1 TABLET NIGHTLY AT BEDTIME 90 tablet 2  . predniSONE (DELTASONE) 5 MG tablet TAKE ONE-HALF (1/2) TABLET (2.5 MG  TOTAL) EVERY OTHER DAY (Patient not taking: Reported on 07/24/2014) 23 tablet 2   No current facility-administered medications on file prior to visit.    BP 142/80 mmHg  Temp(Src) 97.9 F (36.6 C) (Oral)  Ht 6' (1.829 m)  Wt 156 lb 12.8 oz (71.124 kg)  BMI 21.26 kg/m2       Objective:   Physical Exam  Constitutional: He is oriented to person, place, and time. He appears well-developed and well-nourished. No distress.  HENT:  Head: Normocephalic and atraumatic.  Right Ear: External ear normal.  Left Ear: External ear normal.  Nose: Nose normal.  Mouth/Throat: Oropharynx is clear and moist. No oropharyngeal exudate.  No signs of trauma inside of nares. Unable to visualize any areas where the bleeding would be coming from.   No blood in nares  Neurological: He is alert and oriented to person, place, and time.  Skin: Skin is warm and dry. No rash noted. He is not diaphoretic. No erythema. No pallor.  Psychiatric: He has a normal mood and affect. His behavior is normal. Judgment and thought content normal.  Nursing note and vitals reviewed.      Assessment & Plan:  1. Epistaxis - Will follow up with labs - Consider referral to ENT - Afrin nasal spray, 2 sprays in affected nostril when bleeding is present.  - Normal saline nasal spray to help keep nares moist.  - APTT - CBC with Differential/Platelet - Hepatic Function Panel

## 2014-07-24 NOTE — Telephone Encounter (Signed)
[facility] - Client TELEPHONE ADVICE RECORD [facility] Patient Name: [name] DOB: 07/16/1924 Initial Comment Caller states c/o nosebleeds Nurse Assessment Nurse: Lane HackerHarley, RN, Windy Date/Time (Eastern Time): 07/24/2014 8:28:34 AM Confirm and document reason for call. If symptomatic, describe symptoms. ---Caller states that he is having awful nosebleeds that are bleeding a lot in the last 2-3 days. Going thru a box of Kleenex every day. He does not currently have a nose bleed, but his nose has been "plugged with tissue" since last night. Has the patient traveled out of the country within the last 30 days? ---No Does the patient require triage? ---Yes Related visit to physician within the last 2 weeks? ---No Does the PT have any chronic conditions? (i.e. diabetes, asthma, etc.) ---Yes List chronic conditions. ---CABG twice, CAD Guidelines Guideline Title Affirmed Question Affirmed Notes Nosebleed [1] Bleeding recurs 3 or more times in 24 hours AND [2] direct pressure applied correctly He is holding pressure a lot longer than 10 min. - for hours. Final Disposition User See Physician within [address] [place], [place], [place] Comments Dr. Durene CalHunter is out of office. Appt made with Kandee Keenory, NP at 11 am. Referrals REFERRED TO PCP OFFICE Disagree/Comply: Comply

## 2014-07-25 ENCOUNTER — Ambulatory Visit: Payer: Medicare Other | Admitting: Physical Therapy

## 2014-07-25 DIAGNOSIS — R531 Weakness: Secondary | ICD-10-CM

## 2014-07-25 DIAGNOSIS — M25511 Pain in right shoulder: Secondary | ICD-10-CM | POA: Diagnosis present

## 2014-07-25 DIAGNOSIS — R2681 Unsteadiness on feet: Secondary | ICD-10-CM | POA: Diagnosis present

## 2014-07-25 DIAGNOSIS — R293 Abnormal posture: Secondary | ICD-10-CM

## 2014-07-25 DIAGNOSIS — M542 Cervicalgia: Secondary | ICD-10-CM

## 2014-07-25 DIAGNOSIS — M25512 Pain in left shoulder: Secondary | ICD-10-CM

## 2014-07-25 NOTE — Patient Instructions (Signed)
Levator Stretch   Grasp seat or sit on hand on side to be stretched. Turn head toward other side and look down. Use hand on head to gently stretch neck in that position. Hold __30__ seconds. Repeat on other side. Repeat _2-3___ times. Do 2-3____ sessions per day.  http://gt2.exer.us/30   Copyright  VHI. All rights reserved.  Side-Bending   Dont pull on head. Gently turn head to side as far as is comfortable. And look into armpit You may hold onto chair. Stop if there is pain. Hold _30___ seconds. Repeat with other hand to other side. Repeat _2-3___ times. Do _2-3___ sessions per day.   Copyright  VHI. All rights reserved.  Scapular Retraction (Standing)   With arms at sides, pinch shoulder blades together. Repeat _10___ times per set. Do __1__ sets per session. Do _2-3___ sessions per day.  http://orth.exer.us/944   Copyright  VHI. All rights reserved.  Chin Protraction / Retraction   Slide head forward keeping chin level. Slide head back, pulling chin in. Hold each position _5__ seconds. Repeat _5__ times. Do _2-3__ sessions per day.     Shoulder Rotation: Double Arm   On back, knees bent, feet flat, elbows tucked at sides, bent 90, hands palms up. Pull hands apart and down toward floor, keeping elbows near sides. Hold momentarily. Slowly return to starting position. Repeat __10_ times. Band color yellow______   1-2 times a day.   Copyright  VHI. All rights reserved.  [name], PT 07/25/2014 9:00 AM Phone: [phone] Fax: [phone]

## 2014-07-25 NOTE — Therapy (Signed)
[facility] [address] [place], [place], [address] Phone: [phone]   Fax:  [phone]  Physical Therapy Treatment  Patient Details  Name: [name] MRN: [id number] Date of Birth: 01/13/1924 Referring Provider:  Shelva MajesticHunter, Stephen O, MD  Encounter Date: 07/25/2014      PT End of Session - 07/25/14 1143    Visit Number 4   Number of Visits 16   Date for PT Re-Evaluation 08/29/14   Authorization Type Medicare   Authorization Time Period 08/29/14   PT Start Time 0846   PT Stop Time 0944   PT Time Calculation (min) 58 min   Activity Tolerance Patient limited by pain   Behavior During Therapy [facility] for tasks assessed/performed      Past Medical History  Diagnosis Date  . Coronary artery disease   . Stroke   . Benign prostatic hypertrophy   . Myocardial infarction   . Shortness of breath   . Dysrhythmia   . Colitis 12/16/2011  . Irregular heart beat   . Pneumonia 02/03/2013  . DIVERTICULOSIS, COLON 06/17/2006    Past Surgical History  Procedure Laterality Date  . Appendectomy    . Coronary artery bypass graft    . Hemorrhoid surgery    . Knee arthroscopy    . Elbow surgery    . Cataract extraction      x2  . Carotid endarterectomy Left 08-11-04    cea  . Carotid endarterectomy Right 10-08-04    cea    There were no vitals filed for this visit.  Visit Diagnosis:  Pain in neck  Abnormal posture  Generalized weakness  Pain in both shoulders  Unsteadiness      Subjective Assessment - 07/25/14 0852    Subjective head forward no pain at all.  Cant' move my head in any direction   Limitations Standing;Walking   How long can you sit comfortably? 15 min is I keep my neck bent   How long can you stand comfortably? 15 min   How long can you walk comfortably? 15 min   Patient Stated Goals I would like to be able to drive more comfortably and sit at my dinner table and eat without neck pain   Currently in Pain?  Yes   Pain Score 6    Pain Location Neck   Pain Orientation Right   Pain Descriptors / Indicators Constant;Sharp   Pain Type Chronic pain   Pain Onset More than a month ago   Pain Frequency Constant   Aggravating Factors  turning head            [facility] PT Assessment - 07/25/14 0924    AROM   Cervical Flexion 30   Cervical Extension 31  ERP   Cervical - Right Side Bend 18   Cervical - Left Side Bend 20   Cervical - Right Rotation 40   Cervical - Left Rotation 38                     [facility] Adult PT Treatment/Exercise - 07/25/14 0921    Neck Exercises: Seated   Other Seated Exercise shoulder scap squeezes 10 x   Other Seated Exercise chin tucks in sitting x 5   Shoulder Exercises: Supine   External Rotation Both;12 reps;Theraband   Theraband Level (Shoulder External Rotation) Level 1 (Yellow)  pain ful in joints with crepitus   Moist Heat Therapy   Number Minutes Moist Heat 15 Minutes  Moist Heat Location Cervical  upper back   Manual Therapy   Manual Therapy Soft tissue mobilization   Soft tissue mobilization supine SCM STW/strumming, PROM all planes with/wo STW SCM and Upper Trap/ contract relax Upper trap bil   Neck Exercises: Stretches   Upper Trapezius Stretch 3 reps;30 seconds   Upper Trapezius Stretch Limitations pain /jt immobiliyt   Levator Stretch 3 reps;30 seconds   Levator Stretch Limitations pain/jt immobiility          Trigger Point Dry Needling - 07/25/14 1610    Consent Given? Yes   Education Handout Provided --  previousley given   Muscles Treated Upper Body Suboccipitals muscle group;Upper trapezius;Levator scapulae;Oblique capitus   Upper Trapezius Response Twitch reponse elicited;Palpable increased muscle length  bil   Oblique Capitus Response Twitch response elicited  Right side   SubOccipitals Response Twitch response elicited;Palpable increased muscle length  right only   Levator Scapulae Response Twitch response  elicited;Palpable increased muscle length  bil              PT Education - 07/25/14 0906    Education provided Yes   Education Details neck streteches and ER of shoulder bil with yellow T band,  shoulder squeezes and neck retraction , necessity of movement and exercise at home   Person(s) Educated Patient   Methods Explanation;Demonstration;Verbal cues;Handout   Comprehension Verbalized understanding;Returned demonstration;Need further instruction;Verbal cues required          PT Short Term Goals - 07/25/14 1148    PT SHORT TERM GOAL #1   Title "Demonstrate understanding of proper sitting posture, body mechanics, work ergonomics, and be more conscious of position and posture throughout the day.    Time 4   Period Weeks   Status On-going   PT SHORT TERM GOAL #2   Title Independent with initial HEP   Time 4   Period Weeks   Status Achieved   PT SHORT TERM GOAL #3   Title "Report pain decrease from  7 /10 to 5  /10. in order to sit and eat breakfast with more comfort   Baseline 6/10 today   Time 4   Period Weeks   Status On-going           PT Long Term Goals - 07/25/14 1148    PT LONG TERM GOAL #1   Title "Demonstrate and verbalize techniques to reduce the risk of re-injury including: lifting, posture, body mechanics.    Time 8   Period Weeks   Status On-going   PT LONG TERM GOAL #2   Title "Pt will be independent with advanced HEP.    Time 8   Period Weeks   Status On-going   PT LONG TERM GOAL #3   Title "Pain will decrease to 3/10 or less with all functional activities   Time 8   Period Weeks   Status On-going   PT LONG TERM GOAL #4   Title "Pt will tolerate sitting 1 hour without increased pain to ride in car without increased pain.   Time 8   Period Weeks   Status On-going   PT LONG TERM GOAL #5   Title "FOTO will improve from 69% limitation   to 54%   indicating improved functional mobility.    Time 8   Period Weeks   Status On-going                Plan - 07/25/14 0913    Clinical Impression Statement Pt  arrived today with 6/10 pain in neck but increased AROM cervical flex to 30 degrees, 18 Right side bend cervical, R rotation 40 and Left to 30.  Improved from evaluation, but pt posture still an issue and carries himself with flexed posture and head lean to Right.  Pt  has significant audible crepitus with shoulder flex and abduction    Pt will benefit from skilled therapeutic intervention in order to improve on the following deficits Abnormal gait;Decreased activity tolerance;Decreased balance;Decreased mobility;Decreased endurance;Decreased range of motion;Decreased strength;Hypomobility;Difficulty walking;Increased fascial restricitons;Impaired flexibility;Impaired UE functional use;Improper body mechanics;Postural dysfunction;Pain   Rehab Potential Good   PT Frequency 2x / week   PT Duration 8 weeks   PT Next Visit Plan Must do BERG balance next visit and assess/review exercises from last visit.  Pt may benefit from supine cane exercise for self ROM at home.     PT Home Exercise Plan neck tension stretch exercise post dry needling.    Consulted and Agree with Plan of Care Patient        Problem List Patient Active Problem List   Diagnosis Date Noted  . PVD (peripheral vascular disease) 10/05/2013  . PAC (premature atrial contraction) 10/05/2013  . Chronic pain syndrome 09/05/2013  . False positive HIV serology   . NSTEMI (non-ST elevated myocardial infarction) 02/06/2013  . Severe aortic stenosis 02/06/2013  . Cardiomyopathy, ischemic 02/06/2013  . BPH (benign prostatic hyperplasia) 02/03/2013  . Carotid stenosis 12/08/2011  . INSOMNIA-SLEEP DISORDER-UNSPEC 11/26/2009  . GOUT 12/31/2008  . CKD (chronic kidney disease), stage III 12/25/2008  . ANEMIA 01/30/2008  . CAD (coronary artery disease) 01/30/2008  . Hyperlipidemia 06/17/2006  . Essential hypertension 06/17/2006    [name] Lah,  PT 07/25/2014 11:54 AM Phone: [phone] Fax: [phone]  [facility] [address] [place], [place], [address] Phone: [phone]   Fax:  [phone]

## 2014-07-30 ENCOUNTER — Ambulatory Visit: Payer: Medicare Other | Admitting: Physical Therapy

## 2014-07-30 DIAGNOSIS — M542 Cervicalgia: Secondary | ICD-10-CM | POA: Diagnosis not present

## 2014-07-30 DIAGNOSIS — M25511 Pain in right shoulder: Secondary | ICD-10-CM

## 2014-07-30 DIAGNOSIS — R2681 Unsteadiness on feet: Secondary | ICD-10-CM

## 2014-07-30 DIAGNOSIS — M25512 Pain in left shoulder: Secondary | ICD-10-CM

## 2014-07-30 DIAGNOSIS — R293 Abnormal posture: Secondary | ICD-10-CM

## 2014-07-30 DIAGNOSIS — R531 Weakness: Secondary | ICD-10-CM

## 2014-07-30 NOTE — Therapy (Signed)
[facility] [address] [place], [place], [address] Phone: [phone]   Fax:  [phone]  Physical Therapy Treatment  Patient Details  Name: [name] FinesBob E Chambers MRN: [id number] Date of Birth: 09/12/1924 Referring Provider:  Shelva MajesticHunter, Stephen O, MD  Encounter Date: 07/30/2014      PT End of Session - 07/30/14 0852    Visit Number 5   Number of Visits 16   Date for PT Re-Evaluation 08/29/14   Authorization Type Medicare   Authorization Time Period 08/29/14   PT Start Time 0846   PT Stop Time 0845   PT Time Calculation (min) 1439 min      Past Medical History  Diagnosis Date  . Coronary artery disease   . Stroke   . Benign prostatic hypertrophy   . Myocardial infarction   . Shortness of breath   . Dysrhythmia   . Colitis 12/16/2011  . Irregular heart beat   . Pneumonia 02/03/2013  . DIVERTICULOSIS, COLON 06/17/2006    Past Surgical History  Procedure Laterality Date  . Appendectomy    . Coronary artery bypass graft    . Hemorrhoid surgery    . Knee arthroscopy    . Elbow surgery    . Cataract extraction      x2  . Carotid endarterectomy Left 08-11-04    cea  . Carotid endarterectomy Right 10-08-04    cea    There were no vitals filed for this visit.  Visit Diagnosis:  Pain in neck  Abnormal posture  Generalized weakness  Pain in both shoulders  Unsteadiness      Subjective Assessment - 07/30/14 0850    Subjective I wasnt worth a hoot all weekend because I was in so much pain.   How long can you stand comfortably? 15 min   How long can you walk comfortably? 15 min   Patient Stated Goals I would like to be able to drive more comfortably and sit at my dinner table and eat without neck pain   Currently in Pain? Yes   Pain Score 6    Pain Location Neck   Pain Orientation Right   Pain Descriptors / Indicators Constant   Pain Type Chronic pain   Pain Score 9   Pain Location Shoulder   Pain Orientation  Right;Left   Pain Descriptors / Indicators Aching;Tightness;Shooting   Pain Onset More than a month ago            [facility] PT Assessment - 07/30/14 0854    Ambulation/Gait   Ambulation/Gait Yes   Gait velocity 2.3714ft/sec   Berg Balance Test   Sit to Stand Able to stand  independently using hands   Standing Unsupported Able to stand safely 2 minutes   Sitting with Back Unsupported but Feet Supported on Floor or Stool Able to sit safely and securely 2 minutes   Stand to Sit Controls descent by using hands   Transfers Able to transfer safely, minor use of hands   Standing Unsupported with Eyes Closed Able to stand 10 seconds safely   Standing Ubsupported with Feet Together Able to place feet together independently and stand 1 minute safely   From Standing, Reach Forward with Outstretched Arm Can reach forward >12 cm safely (5")   From Standing Position, Pick up Object from Floor Able to pick up shoe safely and easily   From Standing Position, Turn to Look Behind Over each Shoulder Looks behind one side only/other side shows less weight  shift   Turn 360 Degrees Able to turn 360 degrees safely but slowly   Standing Unsupported, Alternately Place Feet on Step/Stool Able to stand independently and complete 8 steps >20 seconds   Standing Unsupported, One Foot in Front Able to take small step independently and hold 30 seconds   Standing on One Leg Tries to lift leg/unable to hold 3 seconds but remains standing independently   Total Score 44                     [facility] Adult PT Treatment/Exercise - 07/30/14 0854    Shoulder Exercises: Supine   Other Supine Exercises began supine scapular stabilizer ex with yellow t band series, flexion, horiz abd, ER  pt needs extra time to complete 1 reps of each.     Shoulder Exercises: Stretch   Corner Stretch 2 reps;60 seconds   Corner Stretch Limitations limited flex but to stretch and minimal discomfort   Moist Heat Therapy   Number Minutes  Moist Heat 15 Minutes   Moist Heat Location Cervical  upper back   Manual Therapy   Manual Therapy Soft tissue mobilization   Soft tissue mobilization supine SCM STW/strumming, PROM all planes with/wo STW SCM and Upper Trap/ contract relax Upper trap bil   Kinesiotex Facilitate Muscle  posture correction bil all taping bil   Kinesiotix   Inhibit Muscle  deltoid    Facilitate Muscle  bil rhomboid                 PT Education - 07/30/14 0925    Education provided Yes   Education Details Pt instructed in corner exercise and supine scapular with Tband yellow for flex , horizon abd and ER extra time needed for exercise due to instabliity of joint and pain with pat.  pt administerd KT tape for faciliating/inhibintg mx.    Person(s) Educated Patient   Methods Explanation;Demonstration   Comprehension Verbalized understanding;Returned demonstration;Verbal cues required          PT Short Term Goals - 07/30/14 1311    PT SHORT TERM GOAL #1   Title "Demonstrate understanding of proper sitting posture, body mechanics, work ergonomics, and be more conscious of position and posture throughout the day.    Time 4   Period Weeks   Status On-going   PT SHORT TERM GOAL #2   Title Independent with initial HEP   Time 4   Period Weeks   Status Achieved   PT SHORT TERM GOAL #3   Title "Report pain decrease from  7 /10 to 5  /10. in order to sit and eat breakfast with more comfort   Baseline 6/10 neck and 9/10 shoulders today   Time 4   Period Weeks   Status On-going           PT Long Term Goals - 07/25/14 1148    PT LONG TERM GOAL #1   Title "Demonstrate and verbalize techniques to reduce the risk of re-injury including: lifting, posture, body mechanics.    Time 8   Period Weeks   Status On-going   PT LONG TERM GOAL #2   Title "Pt will be independent with advanced HEP.    Time 8   Period Weeks   Status On-going   PT LONG TERM GOAL #3   Title "Pain will decrease to 3/10 or  less with all functional activities   Time 8   Period Weeks   Status On-going   PT  LONG TERM GOAL #4   Title "Pt will tolerate sitting 1 hour without increased pain to ride in car without increased pain.   Time 8   Period Weeks   Status On-going   PT LONG TERM GOAL #5   Title "FOTO will improve from 69% limitation   to 54%   indicating improved functional mobility.    Time 8   Period Weeks   Status On-going               Plan - 07/30/14 1230    Clinical Impression Statement Pt arrived with 6/10 pain in neck and 9/10 pain in shoulders. Pt has severe crepitus in shoulder with most movements.  Pt given KT tape for facilitiating /inhibiting bil shoulder mx. and support.  Pt tolerated well but will asssess next visit on benefit . Pt has difficulty completing exercies due to pain and movement even with small movements of shoudler.  PT BERG balance is 44/56 and pt was insturcted to use a cane at the very least. . He possesses a cane and a walker both.  Pt has improved Gait veleocity to 2.14 ft sec.  Pt  will need assistance and guidance to complete shoulder exercsies.  Pt also needs to work on core exercise to imrove core and  decrease risk of fall with strengtheing.  Pt is having difficulty even doing basic and simple shoudler exercises . May need to do isometric to gain initial strength next visit   Pt will benefit from skilled therapeutic intervention in order to improve on the following deficits Abnormal gait;Decreased activity tolerance;Decreased balance;Decreased mobility;Decreased endurance;Decreased range of motion;Decreased strength;Hypomobility;Difficulty walking;Increased fascial restricitons;Impaired flexibility;Impaired UE functional use;Improper body mechanics;Postural dysfunction;Pain   PT Frequency 2x / week   PT Duration 8 weeks   PT Treatment/Interventions ADLs/Self Care Home Management;Cryotherapy;Electrical Stimulation;Gait training;Stair training;Ultrasound;Traction;Moist  Heat;Iontophoresis /ml Dexamethasone;Functional mobility training;Therapeutic exercise;Balance training;Neuromuscular re-education;Manual techniques;Patient/family education;Passive range of motion;Dry needling;Taping   PT Next Visit Plan Assess KT tape , try isometric exercise for shoulder for less painful exercise and scapular ex, distraction/assistance for pt to perform exercise        Problem List Patient Active Problem List   Diagnosis Date Noted  . PVD (peripheral vascular disease) 10/05/2013  . PAC (premature atrial contraction) 10/05/2013  . Chronic pain syndrome 09/05/2013  . False positive HIV serology   . NSTEMI (non-ST elevated myocardial infarction) 02/06/2013  . Severe aortic stenosis 02/06/2013  . Cardiomyopathy, ischemic 02/06/2013  . BPH (benign prostatic hyperplasia) 02/03/2013  . Carotid stenosis 12/08/2011  . INSOMNIA-SLEEP DISORDER-UNSPEC 11/26/2009  . GOUT 12/31/2008  . CKD (chronic kidney disease), stage III 12/25/2008  . ANEMIA 01/30/2008  . CAD (coronary artery disease) 01/30/2008  . Hyperlipidemia 06/17/2006  . Essential hypertension 06/17/2006    [name] Lah, PT 07/30/2014 1:15 PM Phone: [phone] Fax: [phone]  [facility] [address] [place], [place], [address] Phone: [phone]   Fax:  [phone]

## 2014-07-30 NOTE — Patient Instructions (Addendum)
Over Head Pull: Narrow Grip  Make sure you point thumbs up and out with each exercise.      On back, knees bent, feet flat, band across thighs, elbows straight but relaxed. Pull hands apart (start). Keeping elbows straight, bring arms up and over head, hands toward floor. Keep pull steady on band. Hold momentarily. Return slowly, keeping pull steady, back to start. Repeat _10__ times. Band color ___yellow___   Side Pull: Double Arm   On back, knees bent, feet flat. Arms perpendicular to body, shoulder level, elbows straight but relaxed. Pull arms out to sides, elbows straight. Resistance band comes across collarbones, hands toward floor. Hold momentarily. Slowly return to starting position. Repeat _10__ times. Band color ___yellow__   Sash   Shoulder Rotation: Double Arm   On back, knees bent, feet flat, elbows tucked at sides, bent 90, hands palms up. Pull hands apart and down toward floor, keeping elbows near sides. Hold momentarily. Slowly return to starting position. Repeat _10__ times. Band color __yellow____  Flexibility: Corner Stretch   Standing in corner with hands just above shoulder level and feet _8___ inches from corner, lean forward until a comfortable stretch is felt across chest. Hold _20 -30___ seconds. Repeat _2___ times per set. Do _1___ sets per session. Do 2-3____ sessions per day. Do in pain free range of motion, just to tightness. http://orth.exer.us/342   Copyright  VHI. All rights reserved.     [name] LahLawrie Chambers, PT 07/30/2014 9:27 AM Phone: [phone] Fax: [phone]

## 2014-08-01 ENCOUNTER — Ambulatory Visit: Payer: Medicare Other | Admitting: Physical Therapy

## 2014-08-01 DIAGNOSIS — M542 Cervicalgia: Secondary | ICD-10-CM | POA: Diagnosis not present

## 2014-08-01 NOTE — Therapy (Signed)
[place], [place], [address] Phone: [phone]   Fax:  [phone]  Physical Therapy Treatment  Patient Details  Name: [name] MRN: [id number] Date of Birth: 1924/08/05 Referring Provider:  Marin Olp, MD  Encounter Date: 08/01/2014      PT End of Session - 08/01/14 1629    PT Start Time 1545   PT Stop Time 1640   PT Time Calculation (min) 55 min   Activity Tolerance Patient tolerated treatment well   Behavior During Therapy [facility] for tasks assessed/performed      Past Medical History  Diagnosis Date  . Coronary artery disease   . Stroke   . Benign prostatic hypertrophy   . Myocardial infarction   . Shortness of breath   . Dysrhythmia   . Colitis 12/16/2011  . Irregular heart beat   . Pneumonia 02/03/2013  . DIVERTICULOSIS, COLON 06/17/2006    Past Surgical History  Procedure Laterality Date  . Appendectomy    . Coronary artery bypass graft    . Hemorrhoid surgery    . Knee arthroscopy    . Elbow surgery    . Cataract extraction      x2  . Carotid endarterectomy Left 08-11-04    cea  . Carotid endarterectomy Right 10-08-04    cea    There were no vitals filed for this visit.  Visit Diagnosis:  Pain in neck      Subjective Assessment - 08/01/14 1616    Subjective My neck hurts on the right side.  I have been sore since I last came. The tape does not hurt.  Except for one time, my nose bleeds after every session. My DR said if I feel stress I should stop.  I get exhausted and can barely make it home.    I want 30 minutes of heat today and see what happens.   Severe pain today I feel like my head is going to explode.   Currently in Pain? Yes   Pain Score 10-Worst pain ever   Pain Location Neck   Pain Orientation Right   Pain Radiating Towards head/shoulder   Aggravating Factors  Exercise?, turning head   Pain Relieving Factors heat                          [facility] Adult PT Treatment/Exercise - 08/01/14 1550    Moist Heat Therapy   Number Minutes Moist Heat 30 Minutes   Moist Heat Location Cervical                  PT Short Term Goals - 07/30/14 1311    PT SHORT TERM GOAL #1   Title "Demonstrate understanding of proper sitting posture, body mechanics, work ergonomics, and be more conscious of position and posture throughout the day.    Time 4   Period Weeks   Status On-going   PT SHORT TERM GOAL #2   Title Independent with initial HEP   Time 4   Period Weeks   Status Achieved   PT SHORT TERM GOAL #3   Title "Report pain decrease from  7 /10 to 5  /10. in order to sit and eat breakfast with more comfort   Baseline 6/10 neck and 9/10 shoulders today   Time 4   Period Weeks   Status On-going           PT Long Term Goals -  07/25/14 1148    PT LONG TERM GOAL #1   Title "Demonstrate and verbalize techniques to reduce the risk of re-injury including: lifting, posture, body mechanics.    Time 8   Period Weeks   Status On-going   PT LONG TERM GOAL #2   Title "Pt will be independent with advanced HEP.    Time 8   Period Weeks   Status On-going   PT LONG TERM GOAL #3   Title "Pain will decrease to 3/10 or less with all functional activities   Time 8   Period Weeks   Status On-going   PT LONG TERM GOAL #4   Title "Pt will tolerate sitting 1 hour without increased pain to ride in car without increased pain.   Time 8   Period Weeks   Status On-going   PT LONG TERM GOAL #5   Title "FOTO will improve from 69% limitation   to 54%   indicating improved functional mobility.    Time 8   Period Weeks   Status On-going               Plan - 08/01/14 1627    Clinical Impression Statement pain severe today.  See subjective for what patient says.  No exercise at patient's request.  Heat only, no new goals met.   PT Next Visit Plan Discuss plan with PT .  May want to consider light exercise and shorter sessions?      Consulted and Agree with Plan of Care Patient        Problem List Patient Active Problem List   Diagnosis Date Noted  . PVD (peripheral vascular disease) 10/05/2013  . PAC (premature atrial contraction) 10/05/2013  . Chronic pain syndrome 09/05/2013  . False positive HIV serology   . NSTEMI (non-ST elevated myocardial infarction) 02/06/2013  . Severe aortic stenosis 02/06/2013  . Cardiomyopathy, ischemic 02/06/2013  . BPH (benign prostatic hyperplasia) 02/03/2013  . Carotid stenosis 12/08/2011  . INSOMNIA-SLEEP DISORDER-UNSPEC 11/26/2009  . GOUT 12/31/2008  . CKD (chronic kidney disease), stage III 12/25/2008  . ANEMIA 01/30/2008  . CAD (coronary artery disease) 01/30/2008  . Hyperlipidemia 06/17/2006  . Essential hypertension 06/17/2006    [facility] 08/01/2014, 4:30 PM  [facility] [address] [place], [place], [address] Phone: [phone]   Fax:  [phone]     Melvenia Needles, PTA 08/01/2014 4:31 PM Phone: [phone] Fax: [phone]

## 2014-08-06 ENCOUNTER — Ambulatory Visit: Payer: Medicare Other | Admitting: Physical Therapy

## 2014-08-06 DIAGNOSIS — M25512 Pain in left shoulder: Secondary | ICD-10-CM

## 2014-08-06 DIAGNOSIS — R531 Weakness: Secondary | ICD-10-CM

## 2014-08-06 DIAGNOSIS — M542 Cervicalgia: Secondary | ICD-10-CM | POA: Diagnosis not present

## 2014-08-06 DIAGNOSIS — R293 Abnormal posture: Secondary | ICD-10-CM

## 2014-08-06 DIAGNOSIS — M25511 Pain in right shoulder: Secondary | ICD-10-CM

## 2014-08-06 DIAGNOSIS — R2681 Unsteadiness on feet: Secondary | ICD-10-CM

## 2014-08-06 NOTE — Therapy (Signed)
[facility] [address] [place], [place], [address] Phone: [phone]   Fax:  [phone]  Physical Therapy Treatment  Patient Details  Name: [name] MRN: [id number] Date of Birth: November 05, 1924 Referring Provider:  Shelva Majestic, MD  Encounter Date: 08/06/2014      PT End of Session - 08/06/14 0959    Visit Number 7   Number of Visits 16   Date for PT Re-Evaluation 08/29/14   Authorization Type Medicare   Authorization Time Period 08/29/14   PT Start Time 0932   PT Stop Time 1026   PT Time Calculation (min) 54 min   Activity Tolerance Patient tolerated treatment well   Behavior During Therapy [facility] for tasks assessed/performed      Past Medical History  Diagnosis Date  . Coronary artery disease   . Stroke   . Benign prostatic hypertrophy   . Myocardial infarction   . Shortness of breath   . Dysrhythmia   . Colitis 12/16/2011  . Irregular heart beat   . Pneumonia 02/03/2013  . DIVERTICULOSIS, COLON 06/17/2006    Past Surgical History  Procedure Laterality Date  . Appendectomy    . Coronary artery bypass graft    . Hemorrhoid surgery    . Knee arthroscopy    . Elbow surgery    . Cataract extraction      x2  . Carotid endarterectomy Left 08-11-04    cea  . Carotid endarterectomy Right 10-08-04    cea    There were no vitals filed for this visit.  Visit Diagnosis:  Pain in neck  Abnormal posture  Generalized weakness  Pain in both shoulders  Unsteadiness      Subjective Assessment - 08/06/14 0934    Subjective The upper part of my arms are very very sore.   I feel unsteady on my feet.  I am doing a little better on my neck but I want to work on balance because my balance scares me.  when I step up on curb  Ionly use my left to come up because I doint always trust my legs.   Limitations Sitting;Standing   Currently in Pain? Yes   Pain Score 5   left side 2/10   Pain Location Neck   Pain  Descriptors / Indicators Constant   Pain Score 6   Pain Location Shoulder   Pain Orientation Right;Left   Pain Descriptors / Indicators Aching;Tightness   Pain Type Chronic pain   Pain Onset More than a month ago   Pain Frequency Constant            [facility] PT Assessment - 08/06/14 1014    AROM   Right Shoulder Flexion 78 Degrees   Right Shoulder ABduction 75 Degrees   Right Shoulder Internal Rotation 20 Degrees  severe crepitus   Right Shoulder External Rotation 15 Degrees  severe crepitus   Left Shoulder Flexion 90 Degrees   Left Shoulder ABduction 80 Degrees   Left Shoulder Internal Rotation 15 Degrees  severe crepitus   Left Shoulder External Rotation 10 Degrees  severe crepitus   Cervical Flexion 30   Cervical Extension 31  ERP   Cervical - Right Side Bend 18   Cervical - Left Side Bend 20   Cervical - Right Rotation 45   Cervical - Left Rotation 38                     [facility] Adult PT  Treatment/Exercise - 08/06/14 0946    Knee/Hip Exercises: Standing   Heel Raises Both;15 reps   Heel Raises Limitations using UE support   Other Standing Knee Exercises 4 way standing exercise flex, abd, add and extension  right and Left 10 x each   Other Standing Knee Exercises D1 and D2 extension with ball and trunk rotation. diagonals to encourage full body motiion and neck turning.   pt encouraged to look at ball with movement   Shoulder Exercises: Standing   Other Standing Exercises ball on wall flex and abd for 2.5 min left and right arm   Other Standing Exercises shoulder isometrics and varying angles for R and L flex, ext and abd and ER x 10 each   Moist Heat Therapy   Number Minutes Moist Heat 15 Minutes   Moist Heat Location Cervical  and upper back in supine                PT Education - 08/06/14 0958    Education provided Yes   Education Details Added to HEP   4 way SLR and isometric for shoulders all planes to pt tolerance.  Pt with PT  assistance for D1/D2 diagonals with ball with rotation and head turning.  Pt verbalizes understanding of posture and body mechanics when asked questions. and importance of core strength for falls prevention   Person(s) Educated Patient   Methods Explanation;Demonstration;Handout;Tactile cues;Verbal cues   Comprehension Verbalized understanding;Returned demonstration          PT Short Term Goals - 08/06/14 1035    PT SHORT TERM GOAL #1   Title "Demonstrate understanding of proper sitting posture, body mechanics, work ergonomics, and be more conscious of position and posture throughout the day.    Time 4   Period Weeks   Status Achieved   PT SHORT TERM GOAL #2   Title Independent with initial HEP   Time 4   Period Weeks   Status Achieved   PT SHORT TERM GOAL #3   Title "Report pain decrease from  7 /10 to 5  /10. in order to sit and eat breakfast with more comfort   Baseline 6/10 in shoulders and 5/10 in neck today at rest.  Pt has increased pain with elevating arms for functional activities    Time 4   Period Weeks   Status Achieved           PT Long Term Goals - 08/06/14 1704    PT LONG TERM GOAL #1   Title "Demonstrate and verbalize techniques to reduce the risk of re-injury including: lifting, posture, body mechanics.    Time 8   Period Weeks   Status On-going   PT LONG TERM GOAL #2   Title "Pt will be independent with advanced HEP.    Time 8   Period Weeks   Status On-going   PT LONG TERM GOAL #3   Title "Pain will decrease to 3/10 or less with all functional activities   Baseline Pt shoulders 6/10 and neck 5/10   Time 8   Period Weeks   Status On-going   PT LONG TERM GOAL #4   Title "Pt will tolerate sitting 1 hour without increased pain to ride in car without increased pain.   Time 8   Period Weeks   Status On-going   PT LONG TERM GOAL #5   Title "FOTO will improve from 69% limitation   to 54%   indicating improved functional mobility.  Period Weeks    Status On-going               Plan - 08/06/14 1707    Clinical Impression Statement Pt better today.  Neck 5/10 and shoulders 6/10.  Pt still with minimal gains in shoulder flex/abd not past 90 degrees.  slight increase in bil shoulder IR/ER.  Pt with more concerns about steadiness today and exercise and self care revoelved around balance and core strength. Pt is doing better today aftere having several nose bleeds after PT.  Pt may benefit from shorter sessions and  was encouraged to exercise to his tolerance and pain levle   Pt will benefit from skilled therapeutic intervention in order to improve on the following deficits Abnormal gait;Decreased activity tolerance;Decreased balance;Decreased mobility;Decreased endurance;Decreased range of motion;Decreased strength;Hypomobility;Difficulty walking;Increased fascial restricitons;Impaired flexibility;Impaired UE functional use;Improper body mechanics;Postural dysfunction;Pain   Rehab Potential Good   PT Frequency 2x / week   PT Duration 8 weeks   PT Treatment/Interventions ADLs/Self Care Home Management;Cryotherapy;Electrical Stimulation;Gait training;Stair training;Ultrasound;Traction;Moist Heat;Iontophoresis 4mg /ml Dexamethasone;Functional mobility training;Therapeutic exercise;Balance training;Neuromuscular re-education;Manual techniques;Patient/family education;Passive range of motion;Dry needling;Taping   PT Next Visit Plan Review HEP,  try using UE ranger to increase flex and abd of arm. single limb balance    PT Home Exercise Plan 4 way SLR and shoulder isometrics,    Consulted and Agree with Plan of Care Patient        Problem List Patient Active Problem List   Diagnosis Date Noted  . PVD (peripheral vascular disease) 10/05/2013  . PAC (premature atrial contraction) 10/05/2013  . Chronic pain syndrome 09/05/2013  . False positive HIV serology   . NSTEMI (non-ST elevated myocardial infarction) 02/06/2013  . Severe aortic  stenosis 02/06/2013  . Cardiomyopathy, ischemic 02/06/2013  . BPH (benign prostatic hyperplasia) 02/03/2013  . Carotid stenosis 12/08/2011  . INSOMNIA-SLEEP DISORDER-UNSPEC 11/26/2009  . GOUT 12/31/2008  . CKD (chronic kidney disease), stage III 12/25/2008  . ANEMIA 01/30/2008  . CAD (coronary artery disease) 01/30/2008  . Hyperlipidemia 06/17/2006  . Essential hypertension 06/17/2006    [name] Lah, PT 08/06/2014 5:18 PM Phone: [phone] Fax: [phone]  [facility] [address] [place], [place], [address] Phone: [phone]   Fax:  [phone]

## 2014-08-06 NOTE — Patient Instructions (Signed)
Knee High   Holding stable object, raise knee to hip level, then lower knee. Repeat with other knee. Complete __10_ repetitions. Do __2__ sessions per day.  ABDUCTION: Standing (Active)   Stand, feet flat. Lift right leg out to side. Use _0__ lbs. Complete __10_ repetitions. Perform __2_ sessions per day.  ADDUCTION: Standing - Stable (Active)   Stand, right leg out to side as far as possible. Draw leg in across midline. Use _0__ lbs. Complete 10_ repetitions. Perform _2__ sessions per day.       EXTENSION: Standing (Active)  Stand, both feet flat. Draw right leg behind body as far as possible. Use 0___ lbs. Complete 10 repetitions. Perform __2_ sessions per day.  Given shoulder isometrics for flex, extension abd and ER.  Handout given to patient for Shoulder Strengthening Isometrics  Copyright  VHI. All rights reserved.   [name] Lah, PT 08/06/2014 9:40 AM Phone: [phone] Fax: [phone]

## 2014-08-08 ENCOUNTER — Ambulatory Visit: Payer: Medicare Other | Admitting: Physical Therapy

## 2014-08-08 DIAGNOSIS — R293 Abnormal posture: Secondary | ICD-10-CM

## 2014-08-08 DIAGNOSIS — M25512 Pain in left shoulder: Secondary | ICD-10-CM

## 2014-08-08 DIAGNOSIS — M542 Cervicalgia: Secondary | ICD-10-CM

## 2014-08-08 DIAGNOSIS — M25511 Pain in right shoulder: Secondary | ICD-10-CM

## 2014-08-08 DIAGNOSIS — R531 Weakness: Secondary | ICD-10-CM

## 2014-08-08 NOTE — Therapy (Signed)
[facility] [address] [place], [place], [address] Phone: [phone]   Fax:  [phone]  Physical Therapy Treatment  Patient Details  Name: [name] MRN: [id number] Date of Birth: 1924/04/21 Referring Provider:  Shelva Majestic, MD  Encounter Date: 08/08/2014      PT End of Session - 08/08/14 1014    Visit Number 8   Number of Visits 16   Date for PT Re-Evaluation 08/29/14   PT Start Time 0930   PT Stop Time 1029   PT Time Calculation (min) 59 min   Activity Tolerance Patient tolerated treatment well   Behavior During Therapy [facility] for tasks assessed/performed      Past Medical History  Diagnosis Date  . Coronary artery disease   . Stroke   . Benign prostatic hypertrophy   . Myocardial infarction   . Shortness of breath   . Dysrhythmia   . Colitis 12/16/2011  . Irregular heart beat   . Pneumonia 02/03/2013  . DIVERTICULOSIS, COLON 06/17/2006    Past Surgical History  Procedure Laterality Date  . Appendectomy    . Coronary artery bypass graft    . Hemorrhoid surgery    . Knee arthroscopy    . Elbow surgery    . Cataract extraction      x2  . Carotid endarterectomy Left 08-11-04    cea  . Carotid endarterectomy Right 10-08-04    cea    There were no vitals filed for this visit.  Visit Diagnosis:  Abnormal posture  Pain in neck  Generalized weakness  Pain in both shoulders      Subjective Assessment - 08/08/14 1016    Currently in Pain? Yes   Pain Score 8    Pain Location Neck   Pain Orientation Right;Left   Pain Descriptors / Indicators Sore   Pain Radiating Towards shoulders   Aggravating Factors  exercises turning head   Pain Relieving Factors heat                         [facility] Adult PT Treatment/Exercise - 08/08/14 0937    Neck Exercises: Seated   Other Seated Exercise Isometric shoulder 10 reps 5 seconds each.   Knee/Hip Exercises: Standing   Heel Raises 10 reps   both   Hip Abduction 10 reps   Other Standing Knee Exercises hip 4 way 10 reps eachholding counter   Moist Heat Therapy   Number Minutes Moist Heat 15 Minutes   Moist Heat Location Cervical  and upper back                  PT Short Term Goals - 08/06/14 1035    PT SHORT TERM GOAL #1   Title "Demonstrate understanding of proper sitting posture, body mechanics, work ergonomics, and be more conscious of position and posture throughout the day.    Time 4   Period Weeks   Status Achieved   PT SHORT TERM GOAL #2   Title Independent with initial HEP   Time 4   Period Weeks   Status Achieved   PT SHORT TERM GOAL #3   Title "Report pain decrease from  7 /10 to 5  /10. in order to sit and eat breakfast with more comfort   Baseline 6/10 in shoulders and 5/10 in neck today at rest.  Pt has increased pain with elevating arms for functional activities    Time 4  Period Weeks   Status Achieved           PT Long Term Goals - 08/06/14 1704    PT LONG TERM GOAL #1   Title "Demonstrate and verbalize techniques to reduce the risk of re-injury including: lifting, posture, body mechanics.    Time 8   Period Weeks   Status On-going   PT LONG TERM GOAL #2   Title "Pt will be independent with advanced HEP.    Time 8   Period Weeks   Status On-going   PT LONG TERM GOAL #3   Title "Pain will decrease to 3/10 or less with all functional activities   Baseline Pt shoulders 6/10 and neck 5/10   Time 8   Period Weeks   Status On-going   PT LONG TERM GOAL #4   Title "Pt will tolerate sitting 1 hour without increased pain to ride in car without increased pain.   Time 8   Period Weeks   Status On-going   PT LONG TERM GOAL #5   Title "FOTO will improve from 69% limitation   to 54%   indicating improved functional mobility.    Period Weeks   Status On-going               Plan - 08/08/14 1015    Clinical Impression Statement Patient doing his home exercises.  Shoulders  and neck continue to be sore.  His pain today 8/10.  Pain goals nor met.          Problem List Patient Active Problem List   Diagnosis Date Noted  . PVD (peripheral vascular disease) 10/05/2013  . PAC (premature atrial contraction) 10/05/2013  . Chronic pain syndrome 09/05/2013  . False positive HIV serology   . NSTEMI (non-ST elevated myocardial infarction) 02/06/2013  . Severe aortic stenosis 02/06/2013  . Cardiomyopathy, ischemic 02/06/2013  . BPH (benign prostatic hyperplasia) 02/03/2013  . Carotid stenosis 12/08/2011  . INSOMNIA-SLEEP DISORDER-UNSPEC 11/26/2009  . GOUT 12/31/2008  . CKD (chronic kidney disease), stage III 12/25/2008  . ANEMIA 01/30/2008  . CAD (coronary artery disease) 01/30/2008  . Hyperlipidemia 06/17/2006  . Essential hypertension 06/17/2006    [facility] 08/08/2014, 10:18 AM  [facility] [address] [place], [place], [address] Phone: [phone]   Fax:  [phone]     Melvenia Needles, PTA 08/08/2014 10:18 AM Phone: [phone] Fax: [phone]

## 2014-08-12 ENCOUNTER — Ambulatory Visit: Payer: Medicare Other | Attending: Orthopedic Surgery | Admitting: Physical Therapy

## 2014-08-12 DIAGNOSIS — R2681 Unsteadiness on feet: Secondary | ICD-10-CM | POA: Insufficient documentation

## 2014-08-12 DIAGNOSIS — M542 Cervicalgia: Secondary | ICD-10-CM | POA: Insufficient documentation

## 2014-08-12 DIAGNOSIS — R531 Weakness: Secondary | ICD-10-CM | POA: Diagnosis present

## 2014-08-12 DIAGNOSIS — R293 Abnormal posture: Secondary | ICD-10-CM | POA: Insufficient documentation

## 2014-08-12 DIAGNOSIS — M25511 Pain in right shoulder: Secondary | ICD-10-CM | POA: Diagnosis present

## 2014-08-12 DIAGNOSIS — M25512 Pain in left shoulder: Secondary | ICD-10-CM | POA: Diagnosis present

## 2014-08-12 NOTE — Patient Instructions (Signed)
EXTENSION: Standing - Resistance Band: Stable (Active)   Stand, right arm at side. Against yellow resistance band, draw arm backward, as far as possible, keeping elbow straight. Complete __2_ sets of _15__ repetitions. Perform _1-2__ sessions per day. Red theraband     http://ss.exer.us/290   Copyright  VHI. All rights reserved.  Resistive Band Rowing   With resistive band anchored in door, grasp both ends. Keeping elbows bent, pull back, squeezing shoulder blades together. Hold _3-5__ seconds. Repeat _15 x 2___ times. Do _1-2 ___ sessions per day.  Red Theraband  Pt given written handout on quadratus lumborum stretch Right sidelying for home use with pilllow underneath waist.  Given handout.  http://gt2.exer.us/97   Copyright  VHI. All rights reserved.  [name], PT 08/12/2014 1:58 PM Phone: [phone] Fax: [phone]

## 2014-08-12 NOTE — Therapy (Signed)
[place], [place], [address] Phone: [phone]   Fax:  [phone]  Physical Therapy Treatment  Patient Details  Name: [name] MRN: [id number] Date of Birth: 11-07-1924 Referring Provider:  Marin Olp, MD  Encounter Date: 08/12/2014      PT End of Session - 08/12/14 1338    Visit Number 9   Number of Visits 16   Date for PT Re-Evaluation 08/29/14   Authorization Type Medicare   Authorization Time Period 08/29/14   PT Start Time 0130   PT Stop Time 0230   PT Time Calculation (min) 60 min   Activity Tolerance Patient tolerated treatment well   Behavior During Therapy [facility] for tasks assessed/performed      Past Medical History  Diagnosis Date  . Coronary artery disease   . Stroke   . Benign prostatic hypertrophy   . Myocardial infarction   . Shortness of breath   . Dysrhythmia   . Colitis 12/16/2011  . Irregular heart beat   . Pneumonia 02/03/2013  . DIVERTICULOSIS, COLON 06/17/2006    Past Surgical History  Procedure Laterality Date  . Appendectomy    . Coronary artery bypass graft    . Hemorrhoid surgery    . Knee arthroscopy    . Elbow surgery    . Cataract extraction      x2  . Carotid endarterectomy Left 08-11-04    cea  . Carotid endarterectomy Right 10-08-04    cea    There were no vitals filed for this visit.  Visit Diagnosis:  Abnormal posture  Pain in neck  Generalized weakness  Pain in both shoulders  Unsteadiness      Subjective Assessment - 08/12/14 1339    Subjective Pt shoulders Right 8/10 and left 6/10,  i think I am able to drive a little better now.   Limitations Sitting;Standing   How long can you sit comfortably? 20-25 min   How long can you stand comfortably? 20 min   How long can you walk comfortably? 20 min   Patient Stated Goals I would like to be able to drive more comfortably and sit at my dinner table and eat without neck pain   Currently  in Pain? Yes   Pain Score 8   left 6/10   Pain Location Neck   Pain Orientation Right;Left   Pain Descriptors / Indicators Sore   Pain Type Chronic pain   Pain Onset More than a month ago   Pain Frequency Constant   Pain Score 6   Pain Location Shoulder   Pain Orientation Right;Left   Pain Descriptors / Indicators Aching   Pain Type Chronic pain   Pain Onset More than a month ago            [facility] PT Assessment - 08/12/14 1350    Observation/Other Assessments   Observations Pt stands with compressed left  sid d   AROM   Right Shoulder Flexion 89 Degrees   Right Shoulder ABduction 80 Degrees   Right Shoulder Internal Rotation 35 Degrees  severe crepitus   Right Shoulder External Rotation 15 Degrees  severe crepitus   Left Shoulder Flexion 92 Degrees   Left Shoulder ABduction 80 Degrees   Left Shoulder Internal Rotation 15 Degrees  severe crepitus   Left Shoulder External Rotation 10 Degrees  severe crepitus   Cervical Flexion 47   Cervical Extension    34   no pain  Cervical - Right Side Bend 28   Cervical - Left Side Bend 30   Cervical - Right Rotation 40   Cervical - Left Rotation 45                     [facility] Adult PT Treatment/Exercise - 08/12/14 1350    Neck Exercises: Seated   Other Seated Exercise Isometric shoulder 10 reps 5 seconds each.   Lumbar Exercises: Sidelying   Other Sidelying Lumbar Exercises Right sidelying quadratus lumborum stretch with pillow under torso for 5 min.    Shoulder Exercises: Standing   Extension Theraband;Both;15 reps  x2 VC for correct execution   Theraband Level (Shoulder Extension) Level 2 (Red)   Row Both;Strengthening;15 reps;Theraband  x 2 with VC    Theraband Level (Shoulder Row) Level 2 (Red)   Moist Heat Therapy   Number Minutes Moist Heat 15 Minutes   Moist Heat Location Cervical  upper back   Manual Therapy   Soft tissue mobilization Left Quadratus lumborum MFR and soft tissue                 PT Education - 08/12/14 1756    Education provided Yes   Education Details Pt added HEP standing should bil ext and bil rows with red T band in AROM he can tolerate and able to do.  and R Qaudratus Lumborum stretch and community wellness opportunities   Person(s) Educated Patient   Methods Explanation;Demonstration;Tactile cues;Verbal cues   Comprehension Verbalized understanding;Returned demonstration          PT Short Term Goals - 08/12/14 1813    PT SHORT TERM GOAL #1   Title "Demonstrate understanding of proper sitting posture, body mechanics, work ergonomics, and be more conscious of position and posture throughout the day.    Time 4   Period Weeks   Status Achieved   PT SHORT TERM GOAL #2   Title Independent with initial HEP   Time 4   Period Weeks   Status Achieved   PT SHORT TERM GOAL #3   Title "Report pain decrease from  7 /10 to 5  /10. in order to sit and eat breakfast with more comfort   Baseline Pt reports 8/10  and 6/10 in neck and shoulders today.   Time 4   Period Weeks   Status Achieved           PT Long Term Goals - 08/12/14 1345    PT LONG TERM GOAL #1   Title "Demonstrate and verbalize techniques to reduce the risk of re-injury including: lifting, posture, body mechanics.    Baseline Pt is aware but often compensates and adjusts for least painful position even if not proper posture   Time 8   Period Weeks   Status Partially Met   PT LONG TERM GOAL #2   Title "Pt will be independent with advanced HEP.    Time 8   Period Weeks   Status On-going   PT LONG TERM GOAL #3   Title "Pain will decrease to 3/10 or less with all functional activities   Baseline Pt 8/10 and 6/10 today.  Varies with activities   Time 8   Period Weeks   Status On-going   PT LONG TERM GOAL #4   Title "Pt will tolerate sitting 1 hour without increased pain to ride in car without increased pain.   Time 8   Period Weeks   Status On-going   PT LONG TERM  GOAL #5   Title  "FOTO will improve from 69% limitation   to 54%   indicating improved functional mobility.    Time 8   Period Weeks   Status On-going               Plan - 08/12/14 1805    Clinical Impression Statement Pt was educated on community wellness opportunities and also on the expiration time for treatment once he is independent with goals.  Pt has increase AROM in shoulder R/Lflex  89/92, abd 80/80, IR 35/15, ER 15/10, cervcal much improved, flex 47, ext 34, rSB 28 L SB 30 and bil roation 75% full motion.. Pt has nearly accomplished all goals for PT but his pain fluctuates from 4/10 to 8/10.  Pt would benefit from continuing exercise wiht th e [employer] through silver sneakers or aquatics which moould be easier on his dengenrative joints.  Will continue to see Mr. Medlen 3-4 more times and dischareg with HEP   Pt will benefit from skilled therapeutic intervention in order to improve on the following deficits Abnormal gait;Decreased activity tolerance;Decreased balance;Decreased mobility;Decreased endurance;Decreased range of motion;Decreased strength;Hypomobility;Difficulty walking;Increased fascial restricitons;Impaired flexibility;Impaired UE functional use;Improper body mechanics;Postural dysfunction;Pain   Rehab Potential Good   PT Next Visit Plan try using UE ranger to increase flex and abd of arm. single limb balance  do FOTO for 10th visit and G code.  Note to MD already sent this visit   PT Home Exercise Plan QL stretch and standing bil ext and rows. seek out community exercise        Problem List Patient Active Problem List   Diagnosis Date Noted  . PVD (peripheral vascular disease) 10/05/2013  . PAC (premature atrial contraction) 10/05/2013  . Chronic pain syndrome 09/05/2013  . False positive HIV serology   . NSTEMI (non-ST elevated myocardial infarction) 02/06/2013  . Severe aortic stenosis 02/06/2013  . Cardiomyopathy, ischemic 02/06/2013  . BPH (benign prostatic hyperplasia)  02/03/2013  . Carotid stenosis 12/08/2011  . INSOMNIA-SLEEP DISORDER-UNSPEC 11/26/2009  . GOUT 12/31/2008  . CKD (chronic kidney disease), stage III 12/25/2008  . ANEMIA 01/30/2008  . CAD (coronary artery disease) 01/30/2008  . Hyperlipidemia 06/17/2006  . Essential hypertension 06/17/2006    Voncille Lo, PT 08/12/2014 6:20 PM Phone: [phone] Fax: [facility] [address] [place], [place], [address] Phone: [phone]   Fax:  [phone]

## 2014-08-14 ENCOUNTER — Encounter: Payer: Medicare Other | Admitting: Physical Therapy

## 2014-08-16 ENCOUNTER — Ambulatory Visit: Payer: Medicare Other | Admitting: Physical Therapy

## 2014-08-16 DIAGNOSIS — M542 Cervicalgia: Secondary | ICD-10-CM

## 2014-08-16 DIAGNOSIS — M25512 Pain in left shoulder: Secondary | ICD-10-CM

## 2014-08-16 DIAGNOSIS — M25511 Pain in right shoulder: Secondary | ICD-10-CM

## 2014-08-16 NOTE — Therapy (Addendum)
[place], [place], [address] Phone: [phone]   Fax:  [phone]  Physical Therapy Treatment/Discharge Note  Patient Details  Name: [name] MRN: [id number] Date of Birth: 25-Feb-1924 Referring Provider:  Marin Olp, MD  Encounter Date: 08/16/2014      PT End of Session - 08/16/14 0914    Visit Number 10   Number of Visits 16   Date for PT Re-Evaluation 08/29/14      Past Medical History  Diagnosis Date  . Coronary artery disease   . Stroke   . Benign prostatic hypertrophy   . Myocardial infarction   . Shortness of breath   . Dysrhythmia   . Colitis 12/16/2011  . Irregular heart beat   . Pneumonia 02/03/2013  . DIVERTICULOSIS, COLON 06/17/2006    Past Surgical History  Procedure Laterality Date  . Appendectomy    . Coronary artery bypass graft    . Hemorrhoid surgery    . Knee arthroscopy    . Elbow surgery    . Cataract extraction      x2  . Carotid endarterectomy Left 08-11-04    cea  . Carotid endarterectomy Right 10-08-04    cea    There were no vitals filed for this visit.  Visit Diagnosis:  Pain in neck  Pain in both shoulders      Subjective Assessment - 08/16/14 0910    Currently in Pain? Yes   Pain Score --  mild to severe   Pain Location Neck   Pain Orientation Right;Left   Pain Descriptors / Indicators --  so bad I can hardly speak , severe   Pain Radiating Towards shoulders RT arm to elbow   Pain Frequency Intermittent   Aggravating Factors  moving   Pain Relieving Factors heat                         [facility] Adult PT Treatment/Exercise - 08/16/14 0847    Moist Heat Therapy   Number Minutes Moist Heat 30 Minutes   Moist Heat Location Cervical  and Rt shoulder to elbow                  PT Short Term Goals - 08/16/14 0919    PT SHORT TERM GOAL #1   Title "Demonstrate understanding of proper sitting posture, body mechanics,  work ergonomics, and be more conscious of position and posture throughout the day.    Baseline he says he does   Time 4   Period Weeks   Status Achieved   PT SHORT TERM GOAL #2   Title Independent with initial HEP   Baseline he has it memorized   Time 4   Period Weeks   Status Achieved   PT SHORT TERM GOAL #3   Title "Report pain decrease from  7 /10 to 5  /10. in order to sit and eat breakfast with more comfort   Baseline sever at times.   Time 4   Period Weeks   Status Not Met           PT Long Term Goals - 08/16/14 0919    PT LONG TERM GOAL #1   Title "Demonstrate and verbalize techniques to reduce the risk of re-injury including: lifting, posture, body mechanics.    Baseline he says he understands   Time 8   Period Weeks   Status Achieved   PT LONG TERM  GOAL #2   Title "Pt will be independent with advanced HEP.    Baseline independent   Time 8   Period Weeks   Status Achieved   PT LONG TERM GOAL #3   Title "Pain will decrease to 3/10 or less with all functional activities   Baseline severe   Time 8   Period Weeks   Status Not Met   PT LONG TERM GOAL #4   Title "Pt will tolerate sitting 1 hour without increased pain to ride in car without increased pain.   Baseline can sit as long as he wants if he gets in the right position   Time 8   Period Weeks   Status Achieved   PT LONG TERM GOAL #5   Title "FOTO will improve from 69% limitation   to 54%   indicating improved functional mobility.    Baseline 42% limitation   Time 8   Period Weeks   Status Achieved               Problem List Patient Active Problem List   Diagnosis Date Noted  . PVD (peripheral vascular disease) 10/05/2013  . PAC (premature atrial contraction) 10/05/2013  . Chronic pain syndrome 09/05/2013  . False positive HIV serology   . NSTEMI (non-ST elevated myocardial infarction) 02/06/2013  . Severe aortic stenosis 02/06/2013  . Cardiomyopathy, ischemic 02/06/2013  . BPH  (benign prostatic hyperplasia) 02/03/2013  . Carotid stenosis 12/08/2011  . INSOMNIA-SLEEP DISORDER-UNSPEC 11/26/2009  . GOUT 12/31/2008  . CKD (chronic kidney disease), stage III 12/25/2008  . ANEMIA 01/30/2008  . CAD (coronary artery disease) 01/30/2008  . Hyperlipidemia 06/17/2006  . Essential hypertension 06/17/2006    Aretta Stetzel 08/16/2014, 10:00 AM  [facility] [address] [place], [place], [address] Phone: [phone]   Fax:  [phone]     PHYSICAL THERAPY DISCHARGE SUMMARY  Visits from Start of Care: 10  Current functional level related to goals / functional outcomes: Unknown/     Remaining deficits: As above goals indicate.  Pain is severe at times   Education / Equipment: HEP and pain management strategies Plan: Patient agrees to discharge.  Patient goals were partially met. Patient is being discharged due to lack of progress.  ????? and maximized rehab potential for now.  Pt with minimal AROM gains , Pain limiting        Voncille Lo, PT 11/28/2014 8:31 AM Phone: [phone] Fax: [phone]

## 2014-08-16 NOTE — Therapy (Signed)
[place], [place], [address] Phone: [phone]   Fax:  [phone]  Physical Therapy Treatment  Patient Details  Name: [name] MRN: [id number] Date of Birth: [birth year] Referring Provider:  Marin Olp, MD  Encounter Date: 08/16/2014      PT End of Session - 08/16/14 0914    Visit Number 10   Number of Visits 16   Date for PT Re-Evaluation 08/29/14      Past Medical History  Diagnosis Date  . Coronary artery disease   . Stroke   . Benign prostatic hypertrophy   . Myocardial infarction   . Shortness of breath   . Dysrhythmia   . Colitis 12/16/2011  . Irregular heart beat   . Pneumonia 02/03/2013  . DIVERTICULOSIS, COLON 06/17/2006    Past Surgical History  Procedure Laterality Date  . Appendectomy    . Coronary artery bypass graft    . Hemorrhoid surgery    . Knee arthroscopy    . Elbow surgery    . Cataract extraction      x2  . Carotid endarterectomy Left 08-11-04    cea  . Carotid endarterectomy Right 10-08-04    cea    There were no vitals filed for this visit.  Visit Diagnosis:  Pain in neck  Pain in both shoulders      Subjective Assessment - 08/16/14 0910    Currently in Pain? Yes   Pain Score --  mild to severe   Pain Location Neck   Pain Orientation Right;Left   Pain Descriptors / Indicators --  so bad I can hardly speak , severe   Pain Radiating Towards shoulders RT arm to elbow   Pain Frequency Intermittent   Aggravating Factors  moving   Pain Relieving Factors heat     Patient can reach to get his plates out of the cabinets now and able to place key in his high mailbox without difficulty.                    [facility] Adult PT Treatment/Exercise - 08/16/14 0847    Moist Heat Therapy   Number Minutes Moist Heat 30 Minutes   Moist Heat Location Cervical  and Rt shoulder to elbow                  PT Short Term Goals - 08/16/14  0919    PT SHORT TERM GOAL #1   Title "Demonstrate understanding of proper sitting posture, body mechanics, work ergonomics, and be more conscious of position and posture throughout the day.    Baseline he says he does   Time 4   Period Weeks   Status Achieved   PT SHORT TERM GOAL #2   Title Independent with initial HEP   Baseline he has it memorized   Time 4   Period Weeks   Status Achieved   PT SHORT TERM GOAL #3   Title "Report pain decrease from  7 /10 to 5  /10. in order to sit and eat breakfast with more comfort   Baseline sever at times.   Time 4   Period Weeks   Status Not Met           PT Long Term Goals - 08/16/14 0919    PT LONG TERM GOAL #1   Title "Demonstrate and verbalize techniques to reduce the risk of re-injury including: lifting, posture, body mechanics.    Baseline  he says he understands   Time 8   Period Weeks   Status Achieved   PT LONG TERM GOAL #2   Title "Pt will be independent with advanced HEP.    Baseline independent   Time 8   Period Weeks   Status Achieved   PT LONG TERM GOAL #3   Title "Pain will decrease to 3/10 or less with all functional activities   Baseline severe   Time 8   Period Weeks   Status Not Met   PT LONG TERM GOAL #4   Title "Pt will tolerate sitting 1 hour without increased pain to ride in car without increased pain.   Baseline can sit as long as he wants if he gets in the right position   Time 8   Period Weeks   Status Achieved   PT LONG TERM GOAL #5   Title "FOTO will improve from 69% limitation   to 54%   indicating improved functional mobility.    Baseline 42% limitation   Time 8   Period Weeks   Status Achieved               Problem List Patient Active Problem List   Diagnosis Date Noted  . PVD (peripheral vascular disease) 10/05/2013  . PAC (premature atrial contraction) 10/05/2013  . Chronic pain syndrome 09/05/2013  . False positive HIV serology   . NSTEMI (non-ST elevated myocardial  infarction) 02/06/2013  . Severe aortic stenosis 02/06/2013  . Cardiomyopathy, ischemic 02/06/2013  . BPH (benign prostatic hyperplasia) 02/03/2013  . Carotid stenosis 12/08/2011  . INSOMNIA-SLEEP DISORDER-UNSPEC 11/26/2009  . GOUT 12/31/2008  . CKD (chronic kidney disease), stage III 12/25/2008  . ANEMIA 01/30/2008  . CAD (coronary artery disease) 01/30/2008  . Hyperlipidemia 06/17/2006  . Essential hypertension 06/17/2006    [facility] 08/16/2014, 9:52 AM  [facility] [address] [place], [place], [address] Phone: [phone]   Fax:  [phone]     Melvenia Needles, PTA 08/16/2014 9:52 AM Phone: [phone] Fax: [phone]

## 2014-08-28 ENCOUNTER — Ambulatory Visit: Payer: Medicare Other | Admitting: Physical Therapy

## 2014-08-30 ENCOUNTER — Other Ambulatory Visit: Payer: Self-pay | Admitting: Family Medicine

## 2014-09-05 ENCOUNTER — Encounter (HOSPITAL_COMMUNITY): Payer: Self-pay

## 2014-09-05 ENCOUNTER — Emergency Department (HOSPITAL_COMMUNITY): Payer: Medicare Other

## 2014-09-05 ENCOUNTER — Inpatient Hospital Stay (HOSPITAL_COMMUNITY): Payer: Medicare Other

## 2014-09-05 ENCOUNTER — Inpatient Hospital Stay (HOSPITAL_COMMUNITY)
Admission: EM | Admit: 2014-09-05 | Discharge: 2014-09-12 | DRG: 871 | Disposition: A | Discharge status: Skilled Nursing Facility | Payer: Medicare Other | Attending: Internal Medicine | Admitting: Internal Medicine

## 2014-09-05 DIAGNOSIS — I10 Essential (primary) hypertension: Secondary | ICD-10-CM | POA: Diagnosis not present

## 2014-09-05 DIAGNOSIS — E872 Acidosis, unspecified: Secondary | ICD-10-CM

## 2014-09-05 DIAGNOSIS — A419 Sepsis, unspecified organism: Principal | ICD-10-CM | POA: Diagnosis present

## 2014-09-05 DIAGNOSIS — I5032 Chronic diastolic (congestive) heart failure: Secondary | ICD-10-CM | POA: Diagnosis not present

## 2014-09-05 DIAGNOSIS — I5042 Chronic combined systolic (congestive) and diastolic (congestive) heart failure: Secondary | ICD-10-CM | POA: Diagnosis not present

## 2014-09-05 DIAGNOSIS — Z951 Presence of aortocoronary bypass graft: Secondary | ICD-10-CM

## 2014-09-05 DIAGNOSIS — Z7982 Long term (current) use of aspirin: Secondary | ICD-10-CM | POA: Diagnosis not present

## 2014-09-05 DIAGNOSIS — I5043 Acute on chronic combined systolic (congestive) and diastolic (congestive) heart failure: Secondary | ICD-10-CM | POA: Diagnosis present

## 2014-09-05 DIAGNOSIS — N183 Chronic kidney disease, stage 3 (moderate): Secondary | ICD-10-CM | POA: Diagnosis present

## 2014-09-05 DIAGNOSIS — I2510 Atherosclerotic heart disease of native coronary artery without angina pectoris: Secondary | ICD-10-CM | POA: Diagnosis present

## 2014-09-05 DIAGNOSIS — Z823 Family history of stroke: Secondary | ICD-10-CM

## 2014-09-05 DIAGNOSIS — R509 Fever, unspecified: Secondary | ICD-10-CM | POA: Diagnosis present

## 2014-09-05 DIAGNOSIS — N390 Urinary tract infection, site not specified: Secondary | ICD-10-CM | POA: Diagnosis present

## 2014-09-05 DIAGNOSIS — R6521 Severe sepsis with septic shock: Secondary | ICD-10-CM | POA: Diagnosis present

## 2014-09-05 DIAGNOSIS — I252 Old myocardial infarction: Secondary | ICD-10-CM | POA: Diagnosis not present

## 2014-09-05 DIAGNOSIS — N400 Enlarged prostate without lower urinary tract symptoms: Secondary | ICD-10-CM | POA: Diagnosis present

## 2014-09-05 DIAGNOSIS — Z87440 Personal history of urinary (tract) infections: Secondary | ICD-10-CM | POA: Diagnosis not present

## 2014-09-05 DIAGNOSIS — Z8673 Personal history of transient ischemic attack (TIA), and cerebral infarction without residual deficits: Secondary | ICD-10-CM

## 2014-09-05 DIAGNOSIS — N3090 Cystitis, unspecified without hematuria: Secondary | ICD-10-CM

## 2014-09-05 DIAGNOSIS — E861 Hypovolemia: Secondary | ICD-10-CM | POA: Diagnosis present

## 2014-09-05 DIAGNOSIS — Z8249 Family history of ischemic heart disease and other diseases of the circulatory system: Secondary | ICD-10-CM

## 2014-09-05 DIAGNOSIS — N179 Acute kidney failure, unspecified: Secondary | ICD-10-CM | POA: Diagnosis present

## 2014-09-05 DIAGNOSIS — I739 Peripheral vascular disease, unspecified: Secondary | ICD-10-CM | POA: Diagnosis present

## 2014-09-05 DIAGNOSIS — Z87891 Personal history of nicotine dependence: Secondary | ICD-10-CM | POA: Diagnosis not present

## 2014-09-05 DIAGNOSIS — I350 Nonrheumatic aortic (valve) stenosis: Secondary | ICD-10-CM | POA: Diagnosis present

## 2014-09-05 DIAGNOSIS — K625 Hemorrhage of anus and rectum: Secondary | ICD-10-CM | POA: Diagnosis present

## 2014-09-05 DIAGNOSIS — I214 Non-ST elevation (NSTEMI) myocardial infarction: Secondary | ICD-10-CM | POA: Diagnosis present

## 2014-09-05 DIAGNOSIS — I129 Hypertensive chronic kidney disease with stage 1 through stage 4 chronic kidney disease, or unspecified chronic kidney disease: Secondary | ICD-10-CM | POA: Diagnosis present

## 2014-09-05 HISTORY — DX: Nonrheumatic aortic (valve) stenosis: I35.0

## 2014-09-05 HISTORY — DX: Peripheral vascular disease, unspecified: I73.9

## 2014-09-05 HISTORY — DX: Occlusion and stenosis of unspecified carotid artery: I65.29

## 2014-09-05 LAB — URINALYSIS, ROUTINE W REFLEX MICROSCOPIC
GLUCOSE, UA: NEGATIVE mg/dL
KETONES UR: NEGATIVE mg/dL
NITRITE: NEGATIVE
Specific Gravity, Urine: 1.021 (ref 1.005–1.030)
UROBILINOGEN UA: 1 mg/dL (ref 0.0–1.0)
pH: 5.5 (ref 5.0–8.0)

## 2014-09-05 LAB — CBC WITH DIFFERENTIAL/PLATELET
Basophils Absolute: 0 10*3/uL (ref 0.0–0.1)
Basophils Relative: 0 % (ref 0–1)
EOS ABS: 0 10*3/uL (ref 0.0–0.7)
EOS PCT: 0 % (ref 0–5)
HCT: 38.6 % — ABNORMAL LOW (ref 39.0–52.0)
Hemoglobin: 12.1 g/dL — ABNORMAL LOW (ref 13.0–17.0)
LYMPHS ABS: 0.5 10*3/uL — AB (ref 0.7–4.0)
LYMPHS PCT: 5 % — AB (ref 12–46)
MCH: 31.9 pg (ref 26.0–34.0)
MCHC: 31.3 g/dL (ref 30.0–36.0)
MCV: 101.8 fL — AB (ref 78.0–100.0)
MONO ABS: 0.5 10*3/uL (ref 0.1–1.0)
MONOS PCT: 5 % (ref 3–12)
Neutro Abs: 9 10*3/uL — ABNORMAL HIGH (ref 1.7–7.7)
Neutrophils Relative %: 90 % — ABNORMAL HIGH (ref 43–77)
PLATELETS: 173 10*3/uL (ref 150–400)
RBC: 3.79 MIL/uL — ABNORMAL LOW (ref 4.22–5.81)
RDW: 14.6 % (ref 11.5–15.5)
WBC: 10 10*3/uL (ref 4.0–10.5)

## 2014-09-05 LAB — PROTIME-INR
INR: 1.2 (ref 0.00–1.49)
Prothrombin Time: 15.4 seconds — ABNORMAL HIGH (ref 11.6–15.2)

## 2014-09-05 LAB — MRSA PCR SCREENING: MRSA by PCR: NEGATIVE

## 2014-09-05 LAB — TROPONIN I: TROPONIN I: 1.24 ng/mL — AB (ref <0.031)

## 2014-09-05 LAB — LACTIC ACID, PLASMA
LACTIC ACID, VENOUS: 2.6 mmol/L — AB (ref 0.5–2.0)
Lactic Acid, Venous: 2.8 mmol/L (ref 0.5–2.0)

## 2014-09-05 LAB — COMPREHENSIVE METABOLIC PANEL
ALT: 13 U/L — AB (ref 17–63)
ANION GAP: 11 (ref 5–15)
AST: 23 U/L (ref 15–41)
Albumin: 4 g/dL (ref 3.5–5.0)
Alkaline Phosphatase: 91 U/L (ref 38–126)
BUN: 43 mg/dL — ABNORMAL HIGH (ref 6–20)
CHLORIDE: 112 mmol/L — AB (ref 101–111)
CO2: 19 mmol/L — AB (ref 22–32)
CREATININE: 2.65 mg/dL — AB (ref 0.61–1.24)
Calcium: 8.8 mg/dL — ABNORMAL LOW (ref 8.9–10.3)
GFR, EST AFRICAN AMERICAN: 23 mL/min — AB (ref >60)
GFR, EST NON AFRICAN AMERICAN: 20 mL/min — AB (ref >60)
Glucose, Bld: 86 mg/dL (ref 65–99)
Potassium: 4.4 mmol/L (ref 3.5–5.1)
SODIUM: 142 mmol/L (ref 135–145)
Total Bilirubin: 1.4 mg/dL — ABNORMAL HIGH (ref 0.3–1.2)
Total Protein: 6.7 g/dL (ref 6.5–8.1)

## 2014-09-05 LAB — URINE MICROSCOPIC-ADD ON

## 2014-09-05 LAB — APTT: APTT: 30 s (ref 24–37)

## 2014-09-05 LAB — I-STAT CG4 LACTIC ACID, ED: Lactic Acid, Venous: 3.7 mmol/L (ref 0.5–2.0)

## 2014-09-05 LAB — LIPASE, BLOOD: LIPASE: 24 U/L (ref 22–51)

## 2014-09-05 LAB — C DIFFICILE QUICK SCREEN W PCR REFLEX
C DIFFICILE (CDIFF) INTERP: NEGATIVE
C Diff antigen: NEGATIVE
C Diff toxin: NEGATIVE

## 2014-09-05 LAB — MAGNESIUM: Magnesium: 1.8 mg/dL (ref 1.7–2.4)

## 2014-09-05 LAB — POC OCCULT BLOOD, ED: FECAL OCCULT BLD: POSITIVE — AB

## 2014-09-05 LAB — PROCALCITONIN: Procalcitonin: 13.51 ng/mL

## 2014-09-05 MED ORDER — ONDANSETRON HCL 4 MG/2ML IJ SOLN: 4.0000 mg | Freq: Four times a day (QID) | INTRAMUSCULAR | Status: DC | PRN

## 2014-09-05 MED ORDER — ACETAMINOPHEN 650 MG RE SUPP: 650.0000 mg | Freq: Four times a day (QID) | RECTAL | Status: DC | PRN

## 2014-09-05 MED ORDER — DEXTROSE 5 % IV SOLN: 2.0000 g | Freq: Once | INTRAVENOUS | Status: DC

## 2014-09-05 MED ORDER — SODIUM CHLORIDE 0.9 % IV SOLN
INTRAVENOUS | Status: DC
  Administered 2014-09-05: 19:00:00 via INTRAVENOUS
  Administered 2014-09-07: 1000 mL via INTRAVENOUS
  Administered 2014-09-07 – 2014-09-08 (×3): via INTRAVENOUS

## 2014-09-05 MED ORDER — TAMSULOSIN HCL 0.4 MG PO CAPS
0.4000 mg | ORAL_CAPSULE | Freq: Every day | ORAL | Status: DC
  Administered 2014-09-05 – 2014-09-12 (×8): 0.4 mg via ORAL
  Filled 2014-09-05 (×8): qty 1

## 2014-09-05 MED ORDER — VANCOMYCIN HCL IN DEXTROSE 1-5 GM/200ML-% IV SOLN
1000.0000 mg | Freq: Once | INTRAVENOUS | Status: AC
  Administered 2014-09-05: 1000 mg via INTRAVENOUS
  Filled 2014-09-05: qty 200

## 2014-09-05 MED ORDER — PIPERACILLIN-TAZOBACTAM IN DEX 2-0.25 GM/50ML IV SOLN
2.2500 g | Freq: Three times a day (TID) | INTRAVENOUS | Status: DC
  Filled 2014-09-05: qty 50

## 2014-09-05 MED ORDER — ASPIRIN EC 81 MG PO TBEC
81.0000 mg | DELAYED_RELEASE_TABLET | Freq: Every day | ORAL | Status: DC
  Administered 2014-09-05 – 2014-09-12 (×8): 81 mg via ORAL
  Filled 2014-09-05 (×9): qty 1

## 2014-09-05 MED ORDER — SODIUM CHLORIDE 0.9 % IV BOLUS (SEPSIS)
1000.0000 mL | INTRAVENOUS | Status: AC
  Administered 2014-09-05 (×2): 1000 mL via INTRAVENOUS

## 2014-09-05 MED ORDER — SODIUM CHLORIDE 0.9 % IV BOLUS (SEPSIS)
500.0000 mL | INTRAVENOUS | Status: AC
  Administered 2014-09-05: 500 mL via INTRAVENOUS

## 2014-09-05 MED ORDER — ONDANSETRON HCL 4 MG PO TABS
4.0000 mg | ORAL_TABLET | Freq: Four times a day (QID) | ORAL | Status: DC | PRN
  Administered 2014-09-10: 4 mg via ORAL
  Filled 2014-09-05: qty 1

## 2014-09-05 MED ORDER — DEXTROSE 5 % IV SOLN
1.0000 g | INTRAVENOUS | Status: AC
  Administered 2014-09-05 – 2014-09-11 (×7): 1 g via INTRAVENOUS
  Filled 2014-09-05 (×7): qty 10

## 2014-09-05 MED ORDER — PIPERACILLIN-TAZOBACTAM 3.375 G IVPB 30 MIN
3.3750 g | INTRAVENOUS | Status: AC
  Administered 2014-09-05: 3.375 g via INTRAVENOUS
  Filled 2014-09-05: qty 50

## 2014-09-05 MED ORDER — SODIUM CHLORIDE 0.9 % IJ SOLN
3.0000 mL | Freq: Two times a day (BID) | INTRAMUSCULAR | Status: DC
  Administered 2014-09-06 – 2014-09-12 (×10): 3 mL via INTRAVENOUS

## 2014-09-05 MED ORDER — ACETAMINOPHEN 325 MG PO TABS
650.0000 mg | ORAL_TABLET | Freq: Four times a day (QID) | ORAL | Status: DC | PRN
  Administered 2014-09-10 – 2014-09-11 (×3): 650 mg via ORAL
  Filled 2014-09-05 (×3): qty 2

## 2014-09-05 MED ORDER — ENOXAPARIN SODIUM 30 MG/0.3ML ~~LOC~~ SOLN
30.0000 mg | SUBCUTANEOUS | Status: DC
  Administered 2014-09-05 – 2014-09-06 (×2): 30 mg via SUBCUTANEOUS
  Filled 2014-09-05 (×2): qty 0.3

## 2014-09-05 MED ORDER — SODIUM CHLORIDE 0.9 % IV BOLUS (SEPSIS)
1000.0000 mL | Freq: Once | INTRAVENOUS | Status: AC
  Administered 2014-09-05: 1000 mL via INTRAVENOUS

## 2014-09-05 NOTE — Progress Notes (Signed)
CRITICAL VALUE ALERT  Critical value received:  Lactic 2.6  Date of notification:  09/05/14  Time of notification:  2234  Critical value read back:Yes.    Nurse who received alert:  Max Sane, RN  MD notified (1st page):  [facility] on call  Time of first page:  2235

## 2014-09-05 NOTE — ED Notes (Signed)
0 ml of urine from bladder scanner

## 2014-09-05 NOTE — ED Notes (Signed)
Per EMS- Patient c/o diarrhea x 3, vomiting x 2 today. Patient also c/o dysuria and difficulty urinating x 1 1/2 weeks. Patient's daughter states that he has a history of UTI's which he ignores until colitis occurs. Patient states this is typical. Patient also c/o lower abdominal pain.

## 2014-09-05 NOTE — Progress Notes (Addendum)
Follow-up:  Notified by RN regarding repeat lactic acid (2.8) and elevated toponin (1.24). Pt has had no c/o CP and EKG is w/o significant changes. Pt actually reporting he feels much better. Given recent h/o BRB pr will not start heparin at this time. Will continue aggressive fluid resuscitation in progress. Will continue to cycle troponin's and lactic acid. 2-D echo ordered for am. Discussed pt w/ Dr Toniann Fail who is in agreement w/ plan. Will continue to monitor closely in SDU.  Leanne Chang, NP-C [facility] Pager [phone]

## 2014-09-05 NOTE — Progress Notes (Addendum)
ANTIBIOTIC CONSULT NOTE - INITIAL  Pharmacy Consult for zosyn and vancomycin Indication: rule out sepsis  Allergies  Allergen Reactions  . Nsaids     REACTION: renal failure    Patient Measurements: weight 72 kg,  Height 72 inches   Vital Signs: Temp: 98.1 F (36.7 C) (08/25 1224) Temp Source: Oral (08/25 1224) BP: 85/35 mmHg (08/25 1400) Pulse Rate: 77 (08/25 1400) Intake/Output from previous day:   Intake/Output from this shift:    Labs:  Recent Labs  09/05/14 1347  WBC 10.0  HGB 12.1*  PLT 173  CREATININE 2.65*   CrCl cannot be calculated (Unknown ideal weight.). No results for input(s): VANCOTROUGH, VANCOPEAK, VANCORANDOM, GENTTROUGH, GENTPEAK, GENTRANDOM, TOBRATROUGH, TOBRAPEAK, TOBRARND, AMIKACINPEAK, AMIKACINTROU, AMIKACIN in the last 72 hours.   Microbiology: No results found for this or any previous visit (from the past 720 hour(s)).  Medical History: Past Medical History  Diagnosis Date  . Coronary artery disease   . Stroke   . Benign prostatic hypertrophy   . Myocardial infarction   . Shortness of breath   . Dysrhythmia   . Colitis 12/16/2011  . Irregular heart beat   . Pneumonia 02/03/2013  . DIVERTICULOSIS, COLON 06/17/2006    Assessment: Patient's an 81 y.o. M presented to the ED on 8/25 with c/o diarrhea, n/v, abd pain, and dysuria.  LA elevated at 3.70 and scr 2.65 (crcl~19).  To start zosyn and vancomycin for suspected sepsis.  Of note, patient received vancomycin 1gm in the ED at 1433.  - scr 2.65 (crcl~19), LA 3.70  Goal of therapy: Vancomycin trough: 15-20  Plan:  - zosyn 3.375gm IV x1 over 30 minutes, then 2.25 gm IV q8h - vancomycin 1gm IV x1 per MD.  With renal insufficiency, will plan on checking random vancomycin level in 48 hours and re-dose if level is <20 - f/u renal function and culture  Jamaar Howes P 09/05/2014,2:35 PM  Adden: To transition abx to ceftriaxone for urosepsis.  Will start ceftriaxone 1gm IV q24h.  Pharmacy  will sign off since no renal adjustment is needed with ceftriaxone.  Re-consult Pharmacy if need further assistance. Dorna Leitz, PharmD, BCPS 09/05/2014 6:30 PM

## 2014-09-05 NOTE — ED Notes (Signed)
Lab delay - NT in with pt then taking him to the bathroom.

## 2014-09-05 NOTE — ED Provider Notes (Signed)
CSN: [id number]     Arrival date & time 09/05/14  1213 History   First MD Initiated Contact with Patient 09/05/14 1223     Chief Complaint  Patient presents with  . Emesis  . Diarrhea  . Dysuria     (Consider location/radiation/quality/duration/timing/severity/associated sxs/prior Treatment) HPI Comments: Pt comes in with cc of nausea, chills, pain with urination, urge to urinate and diarrhea. He has hx of aortic stenosis, CAD, diverticular disease. Pt reports that he woke up feeling a bit sick, he had sudden and severe urge to defecate and urinate - went to the bathroom and started having chills and bloody stools and pain with urination. Pt felt really weak as well. He reports about 4-5 episodes of emesis and bloody BM. Hr has no abd pain. Pt is feeling cold. His daughter reports that pt has had uti and colitis with similar presentation. Pt presented with a BP of 140, but his SBP dropped to 90s by the time i saw him. He has no risk for Cdiff. He lives independently and is able to take care of himself.   ROS 10 Systems reviewed and are negative for acute change except as noted in the HPI.      Patient is a 81 y.o. male presenting with vomiting, diarrhea, and dysuria. The history is provided by the patient.  Emesis Associated symptoms: diarrhea   Diarrhea Associated symptoms: vomiting   Dysuria    Past Medical History  Diagnosis Date  . Coronary artery disease   . Stroke   . Benign prostatic hypertrophy   . Myocardial infarction   . Shortness of breath   . Dysrhythmia   . Colitis 12/16/2011  . Irregular heart beat   . Pneumonia 02/03/2013  . DIVERTICULOSIS, COLON 06/17/2006   Past Surgical History  Procedure Laterality Date  . Appendectomy    . Coronary artery bypass graft    . Hemorrhoid surgery    . Knee arthroscopy    . Elbow surgery    . Cataract extraction      x2  . Carotid endarterectomy Left 08-11-04    cea  . Carotid endarterectomy Right 10-08-04    cea    Family History  Problem Relation Age of Onset  . Stroke Father   . Heart attack Father   . Heart attack Mother   . Heart attack Brother   . Peripheral vascular disease Brother    Social History  Substance Use Topics  . Smoking status: Former Smoker -- 1.00 packs/day for 40 years    Types: Cigarettes    Quit date: 01/12/1988  . Smokeless tobacco: Never Used  . Alcohol Use: No    Review of Systems  Gastrointestinal: Positive for vomiting and diarrhea.  Genitourinary: Positive for dysuria.  All other systems reviewed and are negative.     Allergies  Nsaids  Home Medications   Prior to Admission medications   Medication Sig Start Date End Date Taking? Authorizing Provider  allopurinol (ZYLOPRIM) 300 MG tablet TAKE 1 TABLET DAILY Patient taking differently: Take 1 tablet daily. 04/15/14  Yes Shelva Majestic, MD  aspirin EC 81 MG EC tablet Take 1 tablet (81 mg total) by mouth daily. 02/06/13  Yes Belkys A Regalado, MD  carvedilol (COREG) 25 MG tablet TAKE 1 TABLET TWICE A DAY WITH A MEAL Patient taking differently: Take 1 tablet twice a day with a meal.   Yes Lindley Magnus, MD  finasteride (PROSCAR) 5 MG tablet Take 5 mg by  mouth daily.   Yes Historical Provider, MD  furosemide (LASIX) 40 MG tablet TAKE 1 TABLET DAILY Patient taking differently: Take 1 tablet daily. 08/30/14  Yes Shelva Majestic, MD  gabapentin (NEURONTIN) 300 MG capsule Take 300 mg every morning & 600 mg at bedtime 02/07/14  Yes Shelva Majestic, MD  isosorbide mononitrate (IMDUR) 60 MG 24 hr tablet TAKE 1 TABLET DAILY Patient taking differently: Take 1 tablet daily. 08/30/14  Yes Shelva Majestic, MD  lisinopril (PRINIVIL,ZESTRIL) 20 MG tablet TAKE 1 TABLET DAILY Patient taking differently: Take 1 tablet daily. 07/22/14  Yes Shelva Majestic, MD  LORazepam (ATIVAN) 0.5 MG tablet TAKE ONE TABLET BY MOUTH NIGHTLY AT BEDTIME AS NEEDED Patient taking differently: Take 1 tablet by mouth nightly at bedtime as  needed for anxiety or sleep. 06/18/14  Yes Shelva Majestic, MD  nitroGLYCERIN (NITROSTAT) 0.4 MG SL tablet Place 1 tablet (0.4 mg total) under the tongue every 5 (five) minutes as needed for chest pain. 01/21/14  Yes Shelva Majestic, MD  tamsulosin (FLOMAX) 0.4 MG CAPS capsule Take 0.4 mg by mouth daily. 12/20/11  Yes Zannie Cove, MD  VYTORIN 10-40 MG per tablet TAKE 1 TABLET NIGHTLY AT BEDTIME Patient taking differently: Take 1 tablet nightly at bedtime. 04/01/14  Yes Shelva Majestic, MD  predniSONE (DELTASONE) 5 MG tablet TAKE ONE-HALF (1/2) TABLET (2.5 MG TOTAL) EVERY OTHER DAY Patient not taking: Reported on 07/24/2014 04/01/14   Shelva Majestic, MD   BP 103/59 mmHg  Pulse 91  Temp(Src) 98.1 F (36.7 C) (Oral)  Resp 14  Ht 6' (1.829 m)  Wt 160 lb (72.576 kg)  BMI 21.70 kg/m2  SpO2 90% Physical Exam  Constitutional: He is oriented to person, place, and time. He appears well-developed.  HENT:  Head: Normocephalic and atraumatic.  Eyes: Conjunctivae and EOM are normal. Pupils are equal, round, and reactive to light.  Neck: Normal range of motion. Neck supple.  Cardiovascular: Regular rhythm.   Murmur heard. Pulmonary/Chest: Effort normal and breath sounds normal. No respiratory distress. He has no wheezes.  Abdominal: Soft. Bowel sounds are normal. He exhibits no distension. There is no tenderness. There is no rebound and no guarding.  Neurological: He is alert and oriented to person, place, and time.  Skin: Skin is warm.  Nursing note and vitals reviewed.   ED Course  Procedures (including critical care time) Labs Review Labs Reviewed  CBC WITH DIFFERENTIAL/PLATELET - Abnormal; Notable for the following:    RBC 3.79 (*)    Hemoglobin 12.1 (*)    HCT 38.6 (*)    MCV 101.8 (*)    Neutrophils Relative % 90 (*)    Neutro Abs 9.0 (*)    Lymphocytes Relative 5 (*)    Lymphs Abs 0.5 (*)    All other components within normal limits  COMPREHENSIVE METABOLIC PANEL - Abnormal;  Notable for the following:    Chloride 112 (*)    CO2 19 (*)    BUN 43 (*)    Creatinine, Ser 2.65 (*)    Calcium 8.8 (*)    ALT 13 (*)    Total Bilirubin 1.4 (*)    GFR calc non Af Amer 20 (*)    GFR calc Af Amer 23 (*)    All other components within normal limits  URINALYSIS, ROUTINE W REFLEX MICROSCOPIC (NOT AT [facility]) - Abnormal; Notable for the following:    Color, Urine RED (*)    APPearance TURBID (*)  Hgb urine dipstick LARGE (*)    Bilirubin Urine SMALL (*)    Protein, ur >300 (*)    Leukocytes, UA LARGE (*)    All other components within normal limits  URINE MICROSCOPIC-ADD ON - Abnormal; Notable for the following:    Bacteria, UA MANY (*)    All other components within normal limits  I-STAT CG4 LACTIC ACID, ED - Abnormal; Notable for the following:    Lactic Acid, Venous 3.70 (*)    All other components within normal limits  POC OCCULT BLOOD, ED - Abnormal; Notable for the following:    Fecal Occult Bld POSITIVE (*)    All other components within normal limits  CULTURE, BLOOD (ROUTINE X 2)  C DIFFICILE QUICK SCREEN W PCR REFLEX  URINE CULTURE  CULTURE, BLOOD (ROUTINE X 2)  STOOL CULTURE  LIPASE, BLOOD  MAGNESIUM    Imaging Review Dg Chest 2 View  09/05/2014   CLINICAL DATA:  Diarrhea and vomiting today, difficulty urinating for 1.5 weeks, history UTIs, coronary artery disease post MI, stroke, pneumonia  EXAM: CHEST  2 VIEW  COMPARISON:  02/03/2013  FINDINGS: Enlargement of cardiac silhouette post CABG.  Slight pulmonary vascular congestion.  Chronic interstitial changes in mid to lower lungs with RIGHT basilar atelectasis and decreased lung volumes.  No definite infiltrate, pleural effusion or pneumothorax.  IMPRESSION: Enlargement of cardiac silhouette post CABG.  Chronic inches disease and RIGHT basilar atelectasis.   Electronically Signed   By: Ulyses Southward M.D.   On: 09/05/2014 15:07   I have personally reviewed and evaluated these images and lab results as  part of my medical decision-making.   EKG Interpretation None      MDM   Final diagnoses:  Lactic acidosis  BRBPR (bright red blood per rectum)  Cystitis    Pt comes in with some emesis, diarrhea and uti like sx. He has bloody stools. His BP dropped significantly from what the EMS reported. With chills, dropping BP, hx of recurrent uti - initial concerns were for undeclared sepsis, bacteremia, uti, and colitis. Aggressive hydration started as per sepsis protocol and early broad spectrum antibiotics started. No abd pain -and Ct not indicated. UA is + for wbc, rbc. - and we think pt has a uti. Bloody stools - likely diverticular bleed and not colitis.  Admitting to step down - if pt doesn't improve, we might need to get Ct and/or prostate evaluation as well to ensure there is no prostatitis.  Dr. Rito Ehrlich raised concerns over O2 sats. Pt on 2 Liters and is saturating in the upper 90s and in no distress, and in no orthopnea as he is laying flat. He has severe AS - so i agree, stepdown bed appropriate to watch him carefully for the GI bleed and also the resp status.   Derwood Kaplan, MD 09/05/14 [phone]

## 2014-09-05 NOTE — Progress Notes (Signed)
CRITICAL VALUE ALERT  Critical value received:  Lactic acid 2.8  Troponins 1.24  Date of notification:  09/05/14  Time of notification:  2030  Critical value read back:Yes.    Nurse who received alert:  Max Sane, RN  MD notified (1st page):  [facility] on call   Time of first page:  2045   Time MD responded:  2050

## 2014-09-05 NOTE — H&P (Signed)
History and Physical  [name] ZOX:[id number] DOB: 04/24/1924 DOA: 09/05/2014  Referring physician: Derwood Kaplan, ER physician PCP: Tana Conch, MD   Chief Complaint: Chills  HPI: [name] is a 81 y.o. male  With past medical history of CAD status post CABG, diastolic heart failure and severe aortic stenosis who was in his usual health when he woke up this morning, but then several hours later when he went to use the bathroom and while sitting on the commode started feeling extremely lightheaded, and started having chills and rigors.  Patient then had several loose bowel movements which were reported bloody. He was brought into the emergency room where he was found to have a very large urinary tract infection, a lactic acid level of 3.7 and a creatinine of 2.65 (was 2.07 in March).  Patient started on sepsis protocol the emergency room. Following blood cultures, given broad-spectrum antibiotics as well as aggressive volume resuscitation. Hospitalist called for further evaluation and admission.  Upon further discussion, patient did recall having several days of burning sensation when urinating   Review of Systems:  Patient seen after arrival in stepdown unit. Pt complains of feeling very weak, chronic back, shoulder and knee pain. He also reports some burning with urination  Pt denies any headaches, vision changes, dysphagia, chest pain, palpitations, shortness of breath, wheeze, cough, abdominal pain, hematuria, constipation or diarrhea. Denies any focal extremity numbness weakness or pain other than described above. Denies any nausea or vomiting.  Review of systems are otherwise negative  Past Medical History  Diagnosis Date  . Coronary artery disease   . Stroke   . Benign prostatic hypertrophy   . Myocardial infarction   . Shortness of breath   . Dysrhythmia   . Colitis 12/16/2011  . Irregular heart beat   . Pneumonia 02/03/2013  . DIVERTICULOSIS, COLON 06/17/2006   Past  Surgical History  Procedure Laterality Date  . Appendectomy    . Coronary artery bypass graft    . Hemorrhoid surgery    . Knee arthroscopy    . Elbow surgery    . Cataract extraction      x2  . Carotid endarterectomy Left 08-11-04    cea  . Carotid endarterectomy Right 10-08-04    cea   Social History:  reports that he quit smoking about 26 years ago. His smoking use included Cigarettes. He has a 40 pack-year smoking history. He has never used smokeless tobacco. He reports that he does not drink alcohol or use illicit drugs. Patient lives at home by himself & is able to participate in activities of daily living with use of a walker and cane  Allergies  Allergen Reactions  . Nsaids     REACTION: renal failure    Family History  Problem Relation Age of Onset  . Stroke Father   . Heart attack Father   . Heart attack Mother   . Heart attack Brother   . Peripheral vascular disease Brother       Prior to Admission medications   Medication Sig Start Date End Date Taking? Authorizing Provider  allopurinol (ZYLOPRIM) 300 MG tablet TAKE 1 TABLET DAILY Patient taking differently: Take 1 tablet daily. 04/15/14  Yes Shelva Majestic, MD  aspirin EC 81 MG EC tablet Take 1 tablet (81 mg total) by mouth daily. 02/06/13  Yes Belkys A Regalado, MD  carvedilol (COREG) 25 MG tablet TAKE 1 TABLET TWICE A DAY WITH A MEAL Patient taking differently: Take 1  tablet twice a day with a meal.   Yes Lindley Magnus, MD  finasteride (PROSCAR) 5 MG tablet Take 5 mg by mouth daily.   Yes Historical Provider, MD  furosemide (LASIX) 40 MG tablet TAKE 1 TABLET DAILY Patient taking differently: Take 1 tablet daily. 08/30/14  Yes Shelva Majestic, MD  gabapentin (NEURONTIN) 300 MG capsule Take 300 mg every morning & 600 mg at bedtime 02/07/14  Yes Shelva Majestic, MD  isosorbide mononitrate (IMDUR) 60 MG 24 hr tablet TAKE 1 TABLET DAILY Patient taking differently: Take 1 tablet daily. 08/30/14  Yes Shelva Majestic,  MD  lisinopril (PRINIVIL,ZESTRIL) 20 MG tablet TAKE 1 TABLET DAILY Patient taking differently: Take 1 tablet daily. 07/22/14  Yes Shelva Majestic, MD  LORazepam (ATIVAN) 0.5 MG tablet TAKE ONE TABLET BY MOUTH NIGHTLY AT BEDTIME AS NEEDED Patient taking differently: Take 1 tablet by mouth nightly at bedtime as needed for anxiety or sleep. 06/18/14  Yes Shelva Majestic, MD  nitroGLYCERIN (NITROSTAT) 0.4 MG SL tablet Place 1 tablet (0.4 mg total) under the tongue every 5 (five) minutes as needed for chest pain. 01/21/14  Yes Shelva Majestic, MD  tamsulosin (FLOMAX) 0.4 MG CAPS capsule Take 0.4 mg by mouth daily. 12/20/11  Yes Zannie Cove, MD  VYTORIN 10-40 MG per tablet TAKE 1 TABLET NIGHTLY AT BEDTIME Patient taking differently: Take 1 tablet nightly at bedtime. 04/01/14  Yes Shelva Majestic, MD    Physical Exam: BP 109/55 mmHg  Pulse 93  Temp(Src) 98.1 F (36.7 C) (Oral)  Resp 18  Ht 6' (1.829 m)  Wt 73.8 kg (162 lb 11.2 oz)  BMI 22.06 kg/m2  SpO2 96%  General:  Alert and oriented 3, no acute distress Eyes: Sclera nonicteric, extraocular movements are intact ENT: Normocephalic, atraumatic, mucous memory and dry Neck: No JVD Cardiovascular: Regular rhythm, 3/6 holosystolic murmur Respiratory: Clear to auscultation bilaterally Abdomen: Soft, nontender, nondistended, positive bowel sounds Skin: No skin breaks, tears or lesions Musculoskeletal: No clubbing or cyanosis or edema Psychiatric: Patient is appropriate, no evidence of psychoses Neurologic: No focal deficits           Labs on Admission:  Basic Metabolic Panel:  Recent Labs Lab 09/05/14 1347  NA 142  K 4.4  CL 112*  CO2 19*  GLUCOSE 86  BUN 43*  CREATININE 2.65*  CALCIUM 8.8*  MG 1.8   Liver Function Tests:  Recent Labs Lab 09/05/14 1347  AST 23  ALT 13*  ALKPHOS 91  BILITOT 1.4*  PROT 6.7  ALBUMIN 4.0    Recent Labs Lab 09/05/14 1347  LIPASE 24   No results for input(s): AMMONIA in the last  168 hours. CBC:  Recent Labs Lab 09/05/14 1347  WBC 10.0  NEUTROABS 9.0*  HGB 12.1*  HCT 38.6*  MCV 101.8*  PLT 173   Cardiac Enzymes: No results for input(s): CKTOTAL, CKMB, CKMBINDEX, TROPONINI in the last 168 hours.  BNP (last 3 results) No results for input(s): BNP in the last 8760 hours.  ProBNP (last 3 results) No results for input(s): PROBNP in the last 8760 hours.  CBG: No results for input(s): GLUCAP in the last 168 hours.  Radiological Exams on Admission: Dg Chest 2 View  09/05/2014   CLINICAL DATA:  Diarrhea and vomiting today, difficulty urinating for 1.5 weeks, history UTIs, coronary artery disease post MI, stroke, pneumonia  EXAM: CHEST  2 VIEW  COMPARISON:  02/03/2013  FINDINGS: Enlargement of cardiac silhouette post  CABG.  Slight pulmonary vascular congestion.  Chronic interstitial changes in mid to lower lungs with RIGHT basilar atelectasis and decreased lung volumes.  No definite infiltrate, pleural effusion or pneumothorax.  IMPRESSION: Enlargement of cardiac silhouette post CABG.  Chronic inches disease and RIGHT basilar atelectasis.   Electronically Signed   By: Ulyses Southward M.D.   On: 09/05/2014 15:07    EKG: Ordered and is pending  Assessment/Plan Present on Admission:  . BRBPR (bright red blood per rectum): Suspect mild diverticular versus loose stooling from hypotension. Enteric precautions and checking stool cultures, but doubt little will come from this. Do not think that he has a GI source given benign abdomen.  . Sepsis secondary to UTI: Urinary source. Patient meets criteria given lactic acidosis, hypotension: Aggressively hydrating. Septic protocol. IV Rocephin.  Marland Kitchen PVD (peripheral vascular disease): Stable continue aspirin  . Essential hypertension: Holding antihypertensives  . acute kidney injury in the setting of CKD (chronic kidney disease), stage III colon secondary to volume depletion from sepsis. Aggressively hydrating.   . Chronic  diastolic heart failure/history of severe aortic stenosis/CAD status post CABG: Appears stable. Checking troponins with expectation that they will have some mild elevation due to sepsis. EKG ordered and is also pending.  Biggest concern is the given sepsis, we aggressively hydrating which may lead to flash pulmonary edema. Have discussed this with critical care and nursing and given that patient is full code, will have low tolerance for intubation  Consultants: None  Code Status: Full code as confirmed with patient  Family Communication: Left message with daughter   Disposition Plan: Hopefully will rally, expect hospitalization at least for several days  Time spent: 45 minutes  Hollice Espy [facility] Pager [phone]

## 2014-09-06 ENCOUNTER — Inpatient Hospital Stay (HOSPITAL_COMMUNITY): Payer: Medicare Other

## 2014-09-06 ENCOUNTER — Encounter (HOSPITAL_COMMUNITY): Payer: Self-pay | Admitting: Cardiology

## 2014-09-06 DIAGNOSIS — I350 Nonrheumatic aortic (valve) stenosis: Secondary | ICD-10-CM

## 2014-09-06 DIAGNOSIS — I214 Non-ST elevation (NSTEMI) myocardial infarction: Secondary | ICD-10-CM

## 2014-09-06 LAB — TROPONIN I
TROPONIN I: 20.51 ng/mL — AB (ref <0.031)
TROPONIN I: 48.92 ng/mL — AB (ref <0.031)
Troponin I: 84.75 ng/mL (ref <0.031)

## 2014-09-06 LAB — CBC
HCT: 36.6 % — ABNORMAL LOW (ref 39.0–52.0)
Hemoglobin: 11.8 g/dL — ABNORMAL LOW (ref 13.0–17.0)
MCH: 33 pg (ref 26.0–34.0)
MCHC: 32.2 g/dL (ref 30.0–36.0)
MCV: 102.2 fL — ABNORMAL HIGH (ref 78.0–100.0)
Platelets: 189 10*3/uL (ref 150–400)
RBC: 3.58 MIL/uL — ABNORMAL LOW (ref 4.22–5.81)
RDW: 14.5 % (ref 11.5–15.5)
WBC: 15 10*3/uL — ABNORMAL HIGH (ref 4.0–10.5)

## 2014-09-06 LAB — COMPREHENSIVE METABOLIC PANEL
ALT: 30 U/L (ref 17–63)
AST: 121 U/L — ABNORMAL HIGH (ref 15–41)
Albumin: 3.3 g/dL — ABNORMAL LOW (ref 3.5–5.0)
Alkaline Phosphatase: 70 U/L (ref 38–126)
Anion gap: 7 (ref 5–15)
BUN: 35 mg/dL — ABNORMAL HIGH (ref 6–20)
CO2: 16 mmol/L — ABNORMAL LOW (ref 22–32)
Calcium: 8 mg/dL — ABNORMAL LOW (ref 8.9–10.3)
Chloride: 119 mmol/L — ABNORMAL HIGH (ref 101–111)
Creatinine, Ser: 1.89 mg/dL — ABNORMAL HIGH (ref 0.61–1.24)
GFR calc Af Amer: 35 mL/min — ABNORMAL LOW (ref >60)
GFR calc non Af Amer: 30 mL/min — ABNORMAL LOW (ref >60)
Glucose, Bld: 116 mg/dL — ABNORMAL HIGH (ref 65–99)
Potassium: 4.9 mmol/L (ref 3.5–5.1)
Sodium: 142 mmol/L (ref 135–145)
Total Bilirubin: 0.7 mg/dL (ref 0.3–1.2)
Total Protein: 6.2 g/dL — ABNORMAL LOW (ref 6.5–8.1)

## 2014-09-06 LAB — URINE CULTURE: CULTURE: NO GROWTH

## 2014-09-06 LAB — LACTIC ACID, PLASMA
Lactic Acid, Venous: 2.2 mmol/L (ref 0.5–2.0)
Lactic Acid, Venous: 2.9 mmol/L (ref 0.5–2.0)

## 2014-09-06 MED ORDER — SODIUM CHLORIDE 0.9 % IV BOLUS (SEPSIS)
1000.0000 mL | Freq: Once | INTRAVENOUS | Status: AC
  Administered 2014-09-06: 1000 mL via INTRAVENOUS

## 2014-09-06 MED ORDER — LORAZEPAM 0.5 MG PO TABS
0.5000 mg | ORAL_TABLET | Freq: Every day | ORAL | Status: DC
  Administered 2014-09-06 – 2014-09-11 (×6): 0.5 mg via ORAL
  Filled 2014-09-06 (×6): qty 1

## 2014-09-06 MED ORDER — NITROGLYCERIN 2 % TD OINT
0.5000 [in_us] | TOPICAL_OINTMENT | Freq: Four times a day (QID) | TRANSDERMAL | Status: DC
  Administered 2014-09-06 – 2014-09-09 (×12): 0.5 [in_us] via TOPICAL
  Filled 2014-09-06 (×2): qty 30

## 2014-09-06 NOTE — Progress Notes (Signed)
PROGRESS NOTE  Leston Schueller Schwalb ZOX:[id number] DOB: 05/12/1924 DOA: 09/05/2014 PCP: Tana Conch, MD  HPI/Recap of past 24 hours: Patient is an 81 year old male past mental history of CAD status post CABG, stage III chronic kidney disease and severe aortic stenosis admitted on 8/25 with septic shock secondary to UTI. Initial lab work significant for acute kidney injury with creatinine at 2.65 felt to be secondary to hypovolemia. Patient started on IV antibiotics and IV fluids. Initial troponin elevated at 1.24. Second troponin came back markedly elevated at 20. Patient himself chest pain-free. Cardiology consulted.  Patient this morning feels somewhat better than yesterday. No complaints. Denies any shortness of breath or chest pain.  Patient's troponin continued to trend up and by 7 AM as high as 49. Patient's white blood cell count went from normal to 15.  His lactic acid level initially of 3.7 has continued trend down and currently at 2.2. Creatinine down to 1.89.  Assessment/Plan: Principal Problem:   Sepsis secondary UTI: Patient meets criteria given lactic acidosis, fever and tachycardia with urinary source. Suspect increase in white count may be more due to stress margination from non-ST elevated MI. Awaiting urine cultures. No other source of infection noted.  Active Problems:   Essential hypertension: Holding antihypertensives given borderline low pressures   Acute kidney injury in the setting of CKD (chronic kidney disease), stage III: Improved. Continue IV fluids.    NSTEMI (non-ST elevated myocardial infarction): Likely started prior to admission. Secondary to sepsis. Being followed by cardiology. Have added nitroglycerin patch. Recommendation is to try to manage conservatively and treat sepsis.  Echocardiogram pending.    Severe aortic stenosis/CAD/chronic diastolic heart failure: Unfortunately with aggressive fluid resuscitation likely leading to volume overload. Monitor for flash  pulmonary edema. Diurese once patient is stable from sepsis  Bright red blood per rectum: Patient initial presentation with sudden onset chills and rigors while using the commode. He had several loose bowel movements and blood. No evidence of blood loss anemia suspect that this may be more body response to hypotension from sepsis. No signs of any abdominal infection. Patient is not having any abdominal pain.  Code Status: Full code  Family Communication: Left msg with daughter  Disposition Plan: Cont in stepdown until sepsis, NSTEMI better stable   Consultants:  Cardiology  Procedures:  Echocardiogram results pending  Antibiotics:  IV vancomycin 8/251 dose  IV Zosyn 8/251 dose  IV Rocephin 8/25-present   Objective: BP 99/53 mmHg  Pulse 65  Temp(Src) 98 F (36.7 C) (Oral)  Resp 18  Ht 6' (1.829 m)  Wt 78.8 kg (173 lb 11.6 oz)  BMI 23.56 kg/m2  SpO2 93%  Intake/Output Summary (Last 24 hours) at 09/06/14 1400 Last data filed at 09/06/14 1037  Gross per 24 hour  Intake 3561.66 ml  Output    115 ml  Net 3446.66 ml   Filed Weights   09/05/14 1436 09/05/14 1825 09/06/14 0500  Weight: 72.576 kg (160 lb) 73.8 kg (162 lb 11.2 oz) 78.8 kg (173 lb 11.6 oz)    Exam:   General:  Alert and oriented 3, no acute distress  Cardiovascular: Regular rate and rhythm, 3/6 holosystolic murmur  Respiratory: Clear to auscultation bilaterally  Abdomen: Soft, nontender, nondistended, positive bowel sounds  Musculoskeletal: No clubbing or cyanosis or edema   Data Reviewed: Basic Metabolic Panel:  Recent Labs Lab 09/05/14 1347 09/06/14 0405  NA 142 142  K 4.4 4.9  CL 112* 119*  CO2 19* 16*  GLUCOSE  86 116*  BUN 43* 35*  CREATININE 2.65* 1.89*  CALCIUM 8.8* 8.0*  MG 1.8  --    Liver Function Tests:  Recent Labs Lab 09/05/14 1347 09/06/14 0405  AST 23 121*  ALT 13* 30  ALKPHOS 91 70  BILITOT 1.4* 0.7  PROT 6.7 6.2*  ALBUMIN 4.0 3.3*    Recent  Labs Lab 09/05/14 1347  LIPASE 24   No results for input(s): AMMONIA in the last 168 hours. CBC:  Recent Labs Lab 09/05/14 1347 09/06/14 0405  WBC 10.0 15.0*  NEUTROABS 9.0*  --   HGB 12.1* 11.8*  HCT 38.6* 36.6*  MCV 101.8* 102.2*  PLT 173 189   Cardiac Enzymes:    Recent Labs Lab 09/05/14 1905 09/06/14 0100 09/06/14 0717  TROPONINI 1.24* 20.51* 48.92*   BNP (last 3 results) No results for input(s): BNP in the last 8760 hours.  ProBNP (last 3 results) No results for input(s): PROBNP in the last 8760 hours.  CBG: No results for input(s): GLUCAP in the last 168 hours.  Recent Results (from the past 240 hour(s))  Blood culture (routine x 2)     Status: None (Preliminary result)   Collection Time: 09/05/14  1:38 PM  Result Value Ref Range Status   Specimen Description   Final    BLOOD RIGHT FOREARM Performed at [facility]    Special Requests BOTTLES DRAWN AEROBIC AND ANAEROBIC  Final   Culture PENDING  Incomplete   Report Status PENDING  Incomplete  Urine culture     Status: None (Preliminary result)   Collection Time: 09/05/14  2:42 PM  Result Value Ref Range Status   Specimen Description URINE, CATHETERIZED  Final   Special Requests NONE  Final   Culture   Final    NO GROWTH < 24 HOURS Performed at [name]    Report Status PENDING  Incomplete  C difficile quick scan w PCR reflex     Status: None   Collection Time: 09/05/14  4:12 PM  Result Value Ref Range Status   C Diff antigen NEGATIVE NEGATIVE Final   C Diff toxin NEGATIVE NEGATIVE Final   C Diff interpretation Negative for toxigenic C. difficile  Final  MRSA PCR Screening     Status: None   Collection Time: 09/05/14  8:59 PM  Result Value Ref Range Status   MRSA by PCR NEGATIVE NEGATIVE Final    Comment:        The GeneXpert MRSA Assay (FDA approved for NASAL specimens only), is one component of a comprehensive MRSA colonization surveillance program. It is  not intended to diagnose MRSA infection nor to guide or monitor treatment for MRSA infections.      Studies: Dg Chest 2 View  09/05/2014   CLINICAL DATA:  Diarrhea and vomiting today, difficulty urinating for 1.5 weeks, history UTIs, coronary artery disease post MI, stroke, pneumonia  EXAM: CHEST  2 VIEW  COMPARISON:  02/03/2013  FINDINGS: Enlargement of cardiac silhouette post CABG.  Slight pulmonary vascular congestion.  Chronic interstitial changes in mid to lower lungs with RIGHT basilar atelectasis and decreased lung volumes.  No definite infiltrate, pleural effusion or pneumothorax.  IMPRESSION: Enlargement of cardiac silhouette post CABG.  Chronic inches disease and RIGHT basilar atelectasis.   Electronically Signed   By: Ulyses Southward M.D.   On: 09/05/2014 15:07    Scheduled Meds: . aspirin EC  81 mg Oral Daily  . cefTRIAXone (ROCEPHIN)  IV  1  g Intravenous Q24H  . enoxaparin (LOVENOX) injection  30 mg Subcutaneous Q24H  . LORazepam  0.5 mg Oral QHS  . nitroGLYCERIN  0.5 inch Topical 4 times per day  . sodium chloride  3 mL Intravenous Q12H  . tamsulosin  0.4 mg Oral Daily    Continuous Infusions: . sodium chloride 100 mL/hr at 09/05/14 1610     Time spent: 45 minutes  Hollice Espy  [facility] Pager [phone]. If 7PM-7AM, please contact night-coverage at [email or web address], password [facility] 09/06/2014, 2:00 PM  LOS: 1 day

## 2014-09-06 NOTE — Care Management Note (Signed)
Case Management Note  Patient Details  Name: VEGA WITHROW MRN: [id number] Date of Birth: 1924/01/29  Subjective/Objective:         sepsis           Action/Plan:Date:  September 06, 2014 U.R. performed for needs and level of care. Will continue to follow for Case Management needs.  Marcelle Smiling, RN, BSN, [place]   [phone]   Expected Discharge Date:   (unknown)               Expected Discharge Plan:  Home/Self Care  In-House Referral:  NA  Discharge planning Services  CM Consult  Post Acute Care Choice:  NA Choice offered to:  NA  DME Arranged:    DME Agency:     HH Arranged:    HH Agency:     Status of Service:  Completed, signed off  Medicare Important Message Given:    Date Medicare IM Given:    Medicare IM give by:    Date Additional Medicare IM Given:    Additional Medicare Important Message give by:     If discussed at Long Length of Stay Meetings, dates discussed:    Additional Comments:  Golda Acre, RN 09/06/2014, 10:27 AM

## 2014-09-06 NOTE — Progress Notes (Signed)
Echocardiogram 2D Echocardiogram has been performed.  Nolon Rod 09/06/2014, 1:34 PM

## 2014-09-06 NOTE — Progress Notes (Signed)
CRITICAL VALUE ALERT  Critical value received:  Troponin 84.75  Date of notification:  09/06/14  Time of notification:  1600  Critical value read back:Yes.    Nurse who received alert:  Shanda Bumps RN  MD notified (1st page):  Rito Ehrlich  Time of first page:  1605  MD notified (2nd page):  Time of second page:  Responding MD:    Time MD responded:

## 2014-09-06 NOTE — Progress Notes (Signed)
CRITICAL VALUE ALERT  Critical value received:  Lactic 2.9  Date of notification:  8/26  Time of notification:  0020  Critical value read back:Yes.    Nurse who received alert:  Max Sane, RN  MD notified (1st page):  [facility]

## 2014-09-06 NOTE — Progress Notes (Signed)
Called by IM for rising troponin. Case discussed with Dr. Elease Hashimoto. Troponin continues to rise but patient is without chest discomfort. Hemodynamics are improving from sepsis standpoint. However, given recent BRBPR (possibly due to sepsis or could be ischemic bowel - + FOBT), AKI on CKD, infection, hematuria by UA, he remains a poor candidate for invasive evaluation at present time. Will need to follow hemoglobin to ensure stability. Hard to know where Hgb will settle as he has since received IV fluids since last check. At present time will hold off IV heparin as there is risk for worsening bleeding. Continue aspirin as tolerated. If pressure remains stable we could consider addition of beta blocker therapy. 2D echo shows EF 40-45% which is stable compared to 01/2013, also with significant AS (not previously felt to be a candidate for traditional valve repair but could consider TAVR down the road).  Dayna Dunn PA-C

## 2014-09-06 NOTE — Consult Note (Signed)
CARDIOLOGY CONSULT NOTE  Patient ID: [name] MRN: [id number] DOB/AGE: 79-Nov-1926 79 y.o.  Admit date: 09/05/2014 Primary Physician [name] Conch, MD Primary Cardiologist Dr. Anne Chambers Chief Complaint(reason for consult)  Elevated troponin  HPI:  The patient had distant cardiac cath in 2004 with LAD 70% proximal stenosis, D1 60% stenosis, D2 70% stenosis, OM1 90% ostial and 99% mid stenosis, RCA is occluded.  He underwent CABG (although I cannot find a description of the grafts in [facility])  There are no recent stress tests in our system.  His last echo was in April of this year.  He has severe AS medically managed.  His EF was 50% with inferior hypokinesis. He also has PVD with CEAs and subclavian stenting.   He presented with chills and rigors.  He is found to have a urinary tract infection with sepsis syndrome.  He did have a mild troponin elevation on admission but no acute EKG changes and no chest pain.  However, this AM his troponin is now 20.51.  Complicating this picture is has acute on chronic CKD with a current creat of 2.65.  In addition he has had BRBPR.  He has been managed with antibiotics and fluids for hypotension.    The patient reports that his symptoms started at about 9 am yesterday.  He had chills and weakness in the lower extremities.  He has had no cardiac symptoms.  The patient denies any new symptoms such as chest discomfort, neck or arm discomfort. There has been no new shortness of breath, PND or orthopnea. There have been no reported palpitations, presyncope or syncope.  He is active with his chores of daily living.   Past Medical History  Diagnosis Date  . Coronary artery disease   . Stroke   . Benign prostatic hypertrophy   . Myocardial infarction   . Shortness of breath   . Dysrhythmia   . Colitis 12/16/2011  . Irregular heart beat   . Pneumonia 02/03/2013  . DIVERTICULOSIS, COLON 06/17/2006    Past Surgical History  Procedure Laterality Date  . Appendectomy     . Coronary artery bypass graft    . Hemorrhoid surgery    . Knee arthroscopy    . Elbow surgery    . Cataract extraction      x2  . Carotid endarterectomy Left 08-11-04    cea  . Carotid endarterectomy Right 10-08-04    cea    Allergies  Allergen Reactions  . Nsaids     REACTION: renal failure   Prescriptions prior to admission  Medication Sig Dispense Refill Last Dose  . allopurinol (ZYLOPRIM) 300 MG tablet TAKE 1 TABLET DAILY (Patient taking differently: Take 1 tablet daily.) 90 tablet 2 09/04/2014 at Unknown time  . aspirin EC 81 MG EC tablet Take 1 tablet (81 mg total) by mouth daily. 30 tablet 0 09/04/2014 at Unknown time  . carvedilol (COREG) 25 MG tablet TAKE 1 TABLET TWICE A DAY WITH A MEAL (Patient taking differently: Take 1 tablet twice a day with a meal.) 90 tablet 2 09/04/2014 at Unknown time  . finasteride (PROSCAR) 5 MG tablet Take 5 mg by mouth daily.   09/04/2014 at Unknown time  . furosemide (LASIX) 40 MG tablet TAKE 1 TABLET DAILY (Patient taking differently: Take 1 tablet daily.) 90 tablet 2 09/04/2014 at Unknown time  . gabapentin (NEURONTIN) 300 MG capsule Take 300 mg every morning & 600 mg at bedtime 270 capsule 3 09/04/2014 at Unknown time  .  isosorbide mononitrate (IMDUR) 60 MG 24 hr tablet TAKE 1 TABLET DAILY (Patient taking differently: Take 1 tablet daily.) 90 tablet 2 09/04/2014 at Unknown time  . lisinopril (PRINIVIL,ZESTRIL) 20 MG tablet TAKE 1 TABLET DAILY (Patient taking differently: Take 1 tablet daily.) 90 tablet 2 09/04/2014 at Unknown time  . LORazepam (ATIVAN) 0.5 MG tablet TAKE ONE TABLET BY MOUTH NIGHTLY AT BEDTIME AS NEEDED (Patient taking differently: Take 1 tablet by mouth nightly at bedtime as needed for anxiety or sleep.) 30 tablet 2 09/04/2014 at Unknown time  . nitroGLYCERIN (NITROSTAT) 0.4 MG SL tablet Place 1 tablet (0.4 mg total) under the tongue every 5 (five) minutes as needed for chest pain. 50 tablet 5 09/05/2014  . tamsulosin (FLOMAX) 0.4 MG CAPS  capsule Take 0.4 mg by mouth daily.   09/04/2014 at Unknown time  . VYTORIN 10-40 MG per tablet TAKE 1 TABLET NIGHTLY AT BEDTIME (Patient taking differently: Take 1 tablet nightly at bedtime.) 90 tablet 2 09/04/2014 at Unknown time   Family History  Problem Relation Age of Onset  . Stroke Father   . Heart attack Father   . Heart attack Mother   . Heart attack Brother   . Peripheral vascular disease Brother     Social History   Social History  . Marital Status: Widowed    Spouse Name: N/A  . Number of Children: N/A  . Years of Education: N/A   Occupational History  . Not on file.   Social History Main Topics  . Smoking status: Former Smoker -- 1.00 packs/day for 40 years    Types: Cigarettes    Quit date: 01/12/1988  . Smokeless tobacco: Never Used  . Alcohol Use: No  . Drug Use: No  . Sexual Activity: No   Other Topics Concern  . Not on file   Social History Narrative   Widowed for 6 years. Married x2, first wife (committed suicide)and patient had a mentally handicapped son. Destroyed marriage. Married 16 years to second. Daughter from 2nd marriage. No grandkids.       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  Otherwise,as stated in the HPI and negative for all other systems.  Physical Exam: Blood pressure 111/54, pulse 85, temperature 98 F (36.7 C), temperature source Oral, resp. rate 17, height 6' (1.829 m), weight 162 lb 11.2 oz (73.8 kg), SpO2 100 %.  GENERAL:  Well appearing and looks younger than stated age. HEENT:  Pupils equal round and reactive, fundi not visualized, oral mucosa unremarkable NECK:  No jugular venous distention, waveform within normal limits,  carotid upstroke brisk and symmetric, no bruits, no thyromegaly LYMPHATICS:  No cervical, inguinal adenopathy LUNGS:  Clear to auscultation bilaterally BACK:  No CVA tenderness CHEST:  Unremarkable HEART:  PMI not displaced or sustained,S1 and S2 within normal limits, no S3, no S4, no clicks, no rubs, 3/6 mid to late peaking systolic murmur radiating out the carotids with no diastolic murmurs ABD:  Flat, positive bowel sounds normal in frequency in pitch, no bruits, no rebound, no guarding, no midline pulsatile mass, no hepatomegaly, no splenomegaly EXT:  2 plus pulses upper, decreased DP/PT  bilateral, no edema, no cyanosis no clubbing SKIN:  No rashes no nodules NEURO:  Cranial nerves II through XII grossly intact, motor grossly intact throughout PSYCH:  Cognitively intact, oriented to person place and time   Labs: Lab Results  Component Value Date   BUN 43* 09/05/2014   Lab Results  Component Value Date   CREATININE 2.65* 09/05/2014   Lab Results  Component Value Date   NA 142 09/05/2014   K 4.4 09/05/2014   CL 112* 09/05/2014   CO2 19* 09/05/2014   Lab Results  Component Value Date   TROPONINI 20.51* 09/06/2014   Lab Results  Component Value Date   WBC 10.0 09/05/2014   HGB 12.1* 09/05/2014   HCT 38.6* 09/05/2014   MCV 101.8* 09/05/2014   PLT 173 09/05/2014    Lab Results  Component Value Date   ALT 13* 09/05/2014   AST 23 09/05/2014   ALKPHOS 91 09/05/2014   BILITOT 1.4* 09/05/2014      Radiology:   CXR: Enlargement of cardiac silhouette post CABG. Slight pulmonary vascular congestion. Chronic interstitial changes in mid to lower lungs with RIGHT basilar atelectasis and decreased lung volumes. No definite infiltrate, pleural effusion or pneumothorax.   EKG:  NSR, rate 90, RBBB, premature ectopic complexes, no acute ST T wave changes.  No change from previous.  09/06/2014     ASSESSMENT AND PLAN:   NSTEMI:  The patient has had an NSTEMI.  However,  this is in the face of an acute illness (sepsis) and severe comorbid conditions including CKD and apparent rectal bleeding.  The MI probably happened greater than 12 hours ago with his acute sepsis presentation.  He has no symptoms.  At this point, cardiac cath is not indicated.  I would continue with ASA and I will order an echocardiogram.  If he has preserved EF (relatively unchanged from previous) I would suggest conservative management with possibly an out patient Lexiscan Myoview for risk stratification. I discussed this at length with him.   AS:  This is severe and is being followed by Dr. Anne Chambers.    SignedRollene Rotunda 09/06/2014, 3:56 AM

## 2014-09-06 NOTE — Progress Notes (Signed)
CRITICAL VALUE ALERT  Critical value received:  Troponin 48.92  Date of notification:  09/06/2014   Time of notification:  0758  Critical value read back:Yes.    Nurse who received alert:  Morene Crocker, RN  MD notified (1st page):  Rito Ehrlich  Time of first page:  0800  MD notified (2nd page):  Time of second page:  Responding MD:   Time MD responded:

## 2014-09-06 NOTE — Progress Notes (Signed)
Follow-up:  Notified of results for 2nd troponin at 20.51!Marland Kitchen Pt continues to deny CP. EKG repeated and noted w/o acute changes. At bedside pt is alert, oriented x 3 and reports he feels "so much better than when he came in to hospital"! VS have remained stable. BBS CTA. HR-80's w/ RRR and + systolic murmur. Discussed w/ pt fact that his blood work reveals he may have had a heart attack and pt stated "no I did not have a heart attack" though he admits to having no CP w/ his first MI prior to his CABG. Explained that cardiologist would be in to see him and he was agreeable w/ this plan.  Assessment/Plan: 1. Elevated troponin: Likely NSTEMI. Consulted with Dr Antoine Poche w/ cardiology service who agreed to come see pt. Will defer further changes in care to cardiology recommendations. Will continue to monitor closely in SDU.   Leanne Chang, NP-C [facility] Pager [phone]

## 2014-09-07 DIAGNOSIS — N183 Chronic kidney disease, stage 3 (moderate): Secondary | ICD-10-CM

## 2014-09-07 DIAGNOSIS — K625 Hemorrhage of anus and rectum: Secondary | ICD-10-CM

## 2014-09-07 DIAGNOSIS — I5032 Chronic diastolic (congestive) heart failure: Secondary | ICD-10-CM

## 2014-09-07 DIAGNOSIS — A419 Sepsis, unspecified organism: Principal | ICD-10-CM

## 2014-09-07 DIAGNOSIS — I739 Peripheral vascular disease, unspecified: Secondary | ICD-10-CM

## 2014-09-07 DIAGNOSIS — N179 Acute kidney failure, unspecified: Secondary | ICD-10-CM

## 2014-09-07 DIAGNOSIS — I10 Essential (primary) hypertension: Secondary | ICD-10-CM

## 2014-09-07 DIAGNOSIS — I350 Nonrheumatic aortic (valve) stenosis: Secondary | ICD-10-CM

## 2014-09-07 LAB — BASIC METABOLIC PANEL
ANION GAP: 6 (ref 5–15)
BUN: 29 mg/dL — AB (ref 6–20)
CHLORIDE: 119 mmol/L — AB (ref 101–111)
CO2: 14 mmol/L — ABNORMAL LOW (ref 22–32)
Calcium: 7.9 mg/dL — ABNORMAL LOW (ref 8.9–10.3)
Creatinine, Ser: 1.43 mg/dL — ABNORMAL HIGH (ref 0.61–1.24)
GFR calc Af Amer: 49 mL/min — ABNORMAL LOW (ref >60)
GFR calc non Af Amer: 42 mL/min — ABNORMAL LOW (ref >60)
GLUCOSE: 108 mg/dL — AB (ref 65–99)
POTASSIUM: 4.6 mmol/L (ref 3.5–5.1)
Sodium: 139 mmol/L (ref 135–145)

## 2014-09-07 LAB — CBC
HEMATOCRIT: 35.3 % — AB (ref 39.0–52.0)
HEMOGLOBIN: 11.5 g/dL — AB (ref 13.0–17.0)
MCH: 33 pg (ref 26.0–34.0)
MCHC: 32.6 g/dL (ref 30.0–36.0)
MCV: 101.1 fL — AB (ref 78.0–100.0)
Platelets: 161 10*3/uL (ref 150–400)
RBC: 3.49 MIL/uL — ABNORMAL LOW (ref 4.22–5.81)
RDW: 14.8 % (ref 11.5–15.5)
WBC: 10.5 10*3/uL (ref 4.0–10.5)

## 2014-09-07 LAB — LACTIC ACID, PLASMA: Lactic Acid, Venous: 2 mmol/L (ref 0.5–2.0)

## 2014-09-07 LAB — TROPONIN I: Troponin I: 50.08 ng/mL (ref <0.031)

## 2014-09-07 MED ORDER — ENOXAPARIN SODIUM 40 MG/0.4ML ~~LOC~~ SOLN
40.0000 mg | SUBCUTANEOUS | Status: DC
  Administered 2014-09-07 – 2014-09-11 (×5): 40 mg via SUBCUTANEOUS
  Filled 2014-09-07 (×6): qty 0.4

## 2014-09-07 NOTE — Progress Notes (Signed)
PROGRESS NOTE  [name] ZOX:[id number] DOB: Oct 25, 1924 DOA: 09/05/2014 PCP: Tana Conch, MD  HPI/Recap of past 24 hours: Patient is an 81 year old male past mental history of CAD status post CABG, stage III chronic kidney disease and severe aortic stenosis admitted on 8/25 with septic shock secondary to UTI. Initial lab work significant for acute kidney injury with creatinine at 2.65 felt to be secondary to hypovolemia. Patient started on IV antibiotics and IV fluids. Initial troponin elevated at 1.24. Second troponin came back markedly elevated at 20. Patient himself chest pain-free. Cardiology consulted.  Patient this morning feels somewhat better than yesterday. No complaints. Denies any shortness of breath or chest pain.  Patient's troponin continued to trend up and peaked as high as 84, before trending down on 8/27. Patient's white blood cell count went from normal to 15 and now back to normal.  His lactic acid level initially of 3.7 has continued to slowly trend down. Creatinine also normalizing.  Assessment/Plan: Principal Problem:   Sepsis secondary UTI: Patient meets criteria given lactic acidosis, fever and tachycardia with urinary source. Suspect increase in white count may be more due to stress margination from non-ST elevated MI. Awaiting urine cultures. No other source of infection noted.  Active Problems:   Essential hypertension: Holding antihypertensives given borderline low pressures. When stable, cardiology recommends adding beta blocker     Acute kidney injury in the setting of CKD (chronic kidney disease), stage WJX:[id number] to improve, near baseline  Continue IV fluids.    NSTEMI (non-ST elevated myocardial infarction): Likely started prior to admission. Secondary to sepsis. Being followed by cardiology. Have added nitroglycerin patch. Recommendation is to try to manage conservatively and treat sepsis.  Echocardiogrammildly decreased ejection fraction, diastolic  dysfunction and stenosed aortic valve.   Severe aortic stenosis/CAD/chronic diastolic heart failure: Unfortunately with aggressive fluid resuscitation likely leading to volume overload. Monitor for flash pulmonary edema. Diurese once patient is stable from sepsis  Bright red blood per rectum: Patient initial presentation with sudden onset chills and rigors while using the commode. He had several loose bowel movements and blood. No evidence of blood loss anemia suspect that this may be more body response to hypotension from sepsis. No signs of any abdominal infection. Patient is not having any abdominal pain.  Code Status: Full code  Family Communication: Left msg with daughter  Disposition Plan: Transfer to floor, will continue to be inpatient at least for several more days   Consultants:  Cardiology  Procedures: Echocardiogram done 8/26: Sodium decreased ejection fraction of 45%, grade 1 diastolic dysfunction and severe aortic valve stenosis  Antibiotics:  IV vancomycin 8/251 dose  IV Zosyn 8/251 dose  IV Rocephin 8/25-present   Objective: BP 120/68 mmHg  Pulse 84  Temp(Src) 98.2 F (36.8 C) (Oral)  Resp 17  Ht 6' (1.829 m)  Wt 78.3 kg (172 lb 9.9 oz)  BMI 23.41 kg/m2  SpO2 96%  Intake/Output Summary (Last 24 hours) at 09/07/14 1223 Last data filed at 09/07/14 1200  Gross per 24 hour  Intake 3211.67 ml  Output    400 ml  Net 2811.67 ml   Filed Weights   09/05/14 1825 09/06/14 0500 09/07/14 0600  Weight: 73.8 kg (162 lb 11.2 oz) 78.8 kg (173 lb 11.6 oz) 78.3 kg (172 lb 9.9 oz)    Exam:   General:  Alert and oriented 3, no acute distress  Cardiovascular: Regular rate and rhythm, 3/6 holosystolic murmur  Respiratory: Clear to auscultation bilaterally, no crackles  Abdomen: Soft, nontender, nondistended, hypoactive bowel sounds  Musculoskeletal: No clubbing or cyanosis or edema   Data Reviewed: Basic Metabolic Panel:  Recent Labs Lab 09/05/14 1347  09/06/14 0405 09/07/14 0408  NA 142 142 139  K 4.4 4.9 4.6  CL 112* 119* 119*  CO2 19* 16* 14*  GLUCOSE 86 116* 108*  BUN 43* 35* 29*  CREATININE 2.65* 1.89* 1.43*  CALCIUM 8.8* 8.0* 7.9*  MG 1.8  --   --    Liver Function Tests:  Recent Labs Lab 09/05/14 1347 09/06/14 0405  AST 23 121*  ALT 13* 30  ALKPHOS 91 70  BILITOT 1.4* 0.7  PROT 6.7 6.2*  ALBUMIN 4.0 3.3*    Recent Labs Lab 09/05/14 1347  LIPASE 24   No results for input(s): AMMONIA in the last 168 hours. CBC:  Recent Labs Lab 09/05/14 1347 09/06/14 0405 09/07/14 0408  WBC 10.0 15.0* 10.5  NEUTROABS 9.0*  --   --   HGB 12.1* 11.8* 11.5*  HCT 38.6* 36.6* 35.3*  MCV 101.8* 102.2* 101.1*  PLT 173 189 161   Cardiac Enzymes:    Recent Labs Lab 09/05/14 1905 09/06/14 0100 09/06/14 0717 09/06/14 1450 09/07/14 0408  TROPONINI 1.24* 20.51* 48.92* 84.75* 50.08*   BNP (last 3 results) No results for input(s): BNP in the last 8760 hours.  ProBNP (last 3 results) No results for input(s): PROBNP in the last 8760 hours.  CBG: No results for input(s): GLUCAP in the last 168 hours.  Recent Results (from the past 240 hour(s))  Blood culture (routine x 2)     Status: None (Preliminary result)   Collection Time: 09/05/14  1:38 PM  Result Value Ref Range Status   Specimen Description BLOOD RIGHT FOREARM  Final   Special Requests BOTTLES DRAWN AEROBIC AND ANAEROBIC  Final   Culture   Final    NO GROWTH < 24 HOURS Performed at [facility]    Report Status PENDING  Incomplete  Blood culture (routine x 2)     Status: None (Preliminary result)   Collection Time: 09/05/14  1:46 PM  Result Value Ref Range Status   Specimen Description BLOOD RIGHT ANTECUBITAL  Final   Special Requests BOTTLES DRAWN AEROBIC AND ANAEROBIC  Final   Culture   Final    NO GROWTH < 24 HOURS Performed at [facility]    Report Status PENDING  Incomplete  Urine culture     Status: None   Collection  Time: 09/05/14  2:42 PM  Result Value Ref Range Status   Specimen Description URINE, CATHETERIZED  Final   Special Requests NONE  Final   Culture   Final    NO GROWTH 1 DAY Performed at [facility]    Report Status 09/06/2014 FINAL  Final  C difficile quick scan w PCR reflex     Status: None   Collection Time: 09/05/14  4:12 PM  Result Value Ref Range Status   C Diff antigen NEGATIVE NEGATIVE Final   C Diff toxin NEGATIVE NEGATIVE Final   C Diff interpretation Negative for toxigenic C. difficile  Final  MRSA PCR Screening     Status: None   Collection Time: 09/05/14  8:59 PM  Result Value Ref Range Status   MRSA by PCR NEGATIVE NEGATIVE Final    Comment:        The GeneXpert MRSA Assay (FDA approved for NASAL specimens only), is one component of a comprehensive MRSA colonization  surveillance program. It is not intended to diagnose MRSA infection nor to guide or monitor treatment for MRSA infections.      Studies: No results found.  Scheduled Meds: . aspirin EC  81 mg Oral Daily  . cefTRIAXone (ROCEPHIN)  IV  1 g Intravenous Q24H  . enoxaparin (LOVENOX) injection  30 mg Subcutaneous Q24H  . LORazepam  0.5 mg Oral QHS  . nitroGLYCERIN  0.5 inch Topical 4 times per day  . sodium chloride  3 mL Intravenous Q12H  . tamsulosin  0.4 mg Oral Daily    Continuous Infusions: . sodium chloride 100 mL/hr at 09/07/14 1610     Time spent: 25 minutes  Hollice Espy  [facility] Pager [phone]. If 7PM-7AM, please contact night-coverage at [email or web address], password [facility] 09/07/2014, 12:23 PM  LOS: 2 days

## 2014-09-07 NOTE — Progress Notes (Signed)
SUBJECTIVE: Denies chest pain and shortness of breath. Frustrated he hasn't seen [name], his cardiologist.     Intake/Output Summary (Last 24 hours) at 09/07/14 0833 Last data filed at 09/07/14 0700  Gross per 24 hour  Intake   2990 ml  Output    500 ml  Net   2490 ml    Current Facility-Administered Medications  Medication Dose Route Frequency Provider Last Rate Last Dose  . 0.9 %  sodium chloride infusion   Intravenous Continuous Hollice Espy, MD 100 mL/hr at 09/07/14 0035 1,000 mL at 09/07/14 0035  . acetaminophen (TYLENOL) tablet 650 mg  650 mg Oral Q6H PRN Hollice Espy, MD       Or  . acetaminophen (TYLENOL) suppository 650 mg  650 mg Rectal Q6H PRN Hollice Espy, MD      . aspirin EC tablet 81 mg  81 mg Oral Daily Hollice Espy, MD   81 mg at 09/06/14 1038  . cefTRIAXone (ROCEPHIN) 1 g in dextrose 5 % 50 mL IVPB  1 g Intravenous Q24H Anh P Pham, RPH   1 g at 09/06/14 2329  . enoxaparin (LOVENOX) injection 30 mg  30 mg Subcutaneous Q24H Hollice Espy, MD   30 mg at 09/06/14 2107  . LORazepam (ATIVAN) tablet 0.5 mg  0.5 mg Oral QHS Hollice Espy, MD   0.5 mg at 09/06/14 2107  . nitroGLYCERIN (NITROGLYN) 2 % ointment 0.5 inch  0.5 inch Topical 4 times per day Hollice Espy, MD   0.5 inch at 09/07/14 0723  . ondansetron (ZOFRAN) tablet 4 mg  4 mg Oral Q6H PRN Hollice Espy, MD       Or  . ondansetron [facility]) injection 4 mg  4 mg Intravenous Q6H PRN Hollice Espy, MD      . sodium chloride 0.9 % injection 3 mL  3 mL Intravenous Q12H Hollice Espy, MD   3 mL at 09/06/14 2107  . tamsulosin (FLOMAX) capsule 0.4 mg  0.4 mg Oral Daily Hollice Espy, MD   0.4 mg at 09/06/14 1038    Filed Vitals:   09/07/14 0500 09/07/14 0600 09/07/14 0700 09/07/14 0800  BP: 116/70 134/67 120/58   Pulse: 85 89 93   Temp:    97.6 F (36.4 C)  TempSrc:    Oral  Resp: Height:      Weight:  172 lb 9.9 oz (78.3 kg)    SpO2: 92%  100% 99%     PHYSICAL EXAM General: NAD HEENT: Normal. Neck: No JVD, no thyromegaly.  Lungs: Clear to auscultation bilaterally with normal respiratory effort. CV: Nondisplaced PMI.  Regular rate and rhythm, normal S1/S2, no S3/S4, 3/6 late peaking ejection systolic murmur heard throughout precordium.  No pretibial edema.   Abdomen: Soft, nontender, no distention.  Neurologic: Alert and oriented x 3.  Psych: Normal affect. Musculoskeletal: No gross deformities. Extremities: No clubbing or cyanosis.   TELEMETRY: Reviewed telemetry pt in sinus rhythm with PVC's.  LABS: Basic Metabolic Panel:  Recent Labs  16/10/96 1347 09/06/14 0405 09/07/14 0408  NA 142 142 139  K 4.4 4.9 4.6  CL 112* 119* 119*  CO2 19* 16* 14*  GLUCOSE 86 116* 108*  BUN 43* 35* 29*  CREATININE 2.65* 1.89* 1.43*  CALCIUM 8.8* 8.0* 7.9*  MG 1.8  --   --    Liver Function Tests:  Recent Labs  09/05/14 1347 09/06/14 0405  AST 23 121*  ALT 13* 30  ALKPHOS 91 70  BILITOT 1.4* 0.7  PROT 6.7 6.2*  ALBUMIN 4.0 3.3*    Recent Labs  09/05/14 1347  LIPASE 24   CBC:  Recent Labs  09/05/14 1347 09/06/14 0405 09/07/14 0408  WBC 10.0 15.0* 10.5  NEUTROABS 9.0*  --   --   HGB 12.1* 11.8* 11.5*  HCT 38.6* 36.6* 35.3*  MCV 101.8* 102.2* 101.1*  PLT 173 189 161   Cardiac Enzymes:  Recent Labs  09/06/14 0717 09/06/14 1450 09/07/14 0408  TROPONINI 48.92* 84.75* 50.08*   BNP: Invalid input(s): POCBNP D-Dimer: No results for input(s): DDIMER in the last 72 hours. Hemoglobin A1C: No results for input(s): HGBA1C in the last 72 hours. Fasting Lipid Panel: No results for input(s): CHOL, HDL, LDLCALC, TRIG, CHOLHDL, LDLDIRECT in the last 72 hours. Thyroid Function Tests: No results for input(s): TSH, T4TOTAL, T3FREE, THYROIDAB in the last 72 hours.  Invalid input(s): FREET3 Anemia Panel: No results for input(s): VITAMINB12, FOLATE, FERRITIN, TIBC, IRON, RETICCTPCT in the last 72  hours.  RADIOLOGY: Dg Chest 2 View  09/05/2014   CLINICAL DATA:  Diarrhea and vomiting today, difficulty urinating for 1.5 weeks, history UTIs, coronary artery disease post MI, stroke, pneumonia  EXAM: CHEST  2 VIEW  COMPARISON:  02/03/2013  FINDINGS: Enlargement of cardiac silhouette post CABG.  Slight pulmonary vascular congestion.  Chronic interstitial changes in mid to lower lungs with RIGHT basilar atelectasis and decreased lung volumes.  No definite infiltrate, pleural effusion or pneumothorax.  IMPRESSION: Enlargement of cardiac silhouette post CABG.  Chronic inches disease and RIGHT basilar atelectasis.   Electronically Signed   By: Ulyses Southward M.D.   On: 09/05/2014 15:07      ASSESSMENT AND PLAN: 1. NSTEMI: Troponins trending down to 50. EF 40-45%. Continue ASA. BP stable today, low yesterday in context of sepsis. If remains stable would consider adding low dose metoprolol on 8/28. Continue ASA. Asymptomatic. Outpatient nuclear stress test to see if large ischemic territories. Not a candidate for cath at present.  2. Moderate to severe aortic stenosis: Possible TAVR candidate in future. Moderate by echoDefer to his cardiologist, [name].   Prentice Docker, M.D., F.A.C.C.

## 2014-09-08 DIAGNOSIS — I5042 Chronic combined systolic (congestive) and diastolic (congestive) heart failure: Secondary | ICD-10-CM

## 2014-09-08 DIAGNOSIS — N390 Urinary tract infection, site not specified: Secondary | ICD-10-CM

## 2014-09-08 LAB — CBC
HEMATOCRIT: 32.1 % — AB (ref 39.0–52.0)
HEMOGLOBIN: 10.7 g/dL — AB (ref 13.0–17.0)
MCH: 33.3 pg (ref 26.0–34.0)
MCHC: 33.3 g/dL (ref 30.0–36.0)
MCV: 100 fL (ref 78.0–100.0)
Platelets: 159 10*3/uL (ref 150–400)
RBC: 3.21 MIL/uL — ABNORMAL LOW (ref 4.22–5.81)
RDW: 14.6 % (ref 11.5–15.5)
WBC: 7.6 10*3/uL (ref 4.0–10.5)

## 2014-09-08 LAB — LACTIC ACID, PLASMA: Lactic Acid, Venous: 1 mmol/L (ref 0.5–2.0)

## 2014-09-08 LAB — TROPONIN I: Troponin I: 25.07 ng/mL (ref <0.031)

## 2014-09-08 LAB — BASIC METABOLIC PANEL
Anion gap: 6 (ref 5–15)
BUN: 26 mg/dL — AB (ref 6–20)
CHLORIDE: 120 mmol/L — AB (ref 101–111)
CO2: 14 mmol/L — AB (ref 22–32)
Calcium: 7.9 mg/dL — ABNORMAL LOW (ref 8.9–10.3)
Creatinine, Ser: 1.34 mg/dL — ABNORMAL HIGH (ref 0.61–1.24)
GFR calc Af Amer: 53 mL/min — ABNORMAL LOW (ref >60)
GFR calc non Af Amer: 46 mL/min — ABNORMAL LOW (ref >60)
GLUCOSE: 101 mg/dL — AB (ref 65–99)
POTASSIUM: 4.3 mmol/L (ref 3.5–5.1)
Sodium: 140 mmol/L (ref 135–145)

## 2014-09-08 MED ORDER — OXYCODONE-ACETAMINOPHEN 5-325 MG PO TABS
1.0000 | ORAL_TABLET | ORAL | Status: DC | PRN
  Administered 2014-09-08 – 2014-09-11 (×5): 1 via ORAL
  Filled 2014-09-08 (×5): qty 1

## 2014-09-08 MED ORDER — METOPROLOL TARTRATE 25 MG PO TABS
12.5000 mg | ORAL_TABLET | Freq: Two times a day (BID) | ORAL | Status: DC
  Administered 2014-09-08 – 2014-09-09 (×3): 12.5 mg via ORAL
  Filled 2014-09-08 (×3): qty 1

## 2014-09-08 MED ORDER — FUROSEMIDE 10 MG/ML IJ SOLN
20.0000 mg | Freq: Two times a day (BID) | INTRAMUSCULAR | Status: DC
  Administered 2014-09-08 – 2014-09-10 (×5): 20 mg via INTRAVENOUS
  Filled 2014-09-08 (×5): qty 2

## 2014-09-08 NOTE — Progress Notes (Signed)
PROGRESS NOTE  [name] ZOX:[id number] DOB: May 20, 1924 DOA: 09/05/2014 PCP: Tana Conch, MD  HPI/Recap of past 24 hours: Patient is an 81 year old male past mental history of CAD status post CABG, stage III chronic kidney disease and severe aortic stenosis admitted on 8/25 with septic shock secondary to UTI. Initial lab work significant for acute kidney injury with creatinine at 2.65 felt to be secondary to hypovolemia. Patient started on IV antibiotics and IV fluids. Initial troponin elevated at 1.24. Second troponin came back markedly elevated at 20 and continued to rise peaking at 84, before trending downward. Patient himself has remained chest pain-free. As lactic acid level resolved, patient stabilized and transferred to floor.  This morning, patient worn out, just trying to get up and sit on bedside commode. Denies any shortness of breath. At this point, creatinine near baseline. White count normalized.  Assessment/Plan: Principal Problem:   Sepsis secondary UTI: Patient meets criteria given lactic acidosis, fever and tachycardia with urinary source. Suspect increase in white count may be more due to stress margination from non-ST elevated MI. Urine cultures show no growth yet No other source of infection noted.   Active Problems:   Essential hypertension: Holding antihypertensives given borderline low pressures. When stable, cardiology recommends adding beta blocker     Acute kidney injury in the setting of CKD (chronic kidney disease), stage WJX:[id number] to improve, near baseline  Continue IV fluids.    NSTEMI (non-ST elevated myocardial infarction): Likely started prior to admission. Secondary to sepsis. Being followed by cardiology. Have added nitroglycerin patch. Recommendation is to try to manage conservatively and treat sepsis.  Echocardiogram notes mildly decreased ejection fraction, diastolic dysfunction and stenosed aortic valve. Metoprolol started. Outpatient nuclear  stress test. Not a candidate for catheterization    Severe aortic stenosis/CAD/chronic diastolic heart failure: Unfortunately with aggressive fluid resuscitation likely leading to volume overload. Monitor for flash pulmonary edema. Another renal function normalized and he is post-sepsis, we'll start gentle diuresis  Bright red blood per rectum: Patient initial presentation with sudden onset chills and rigors while using the commode. He had several loose bowel movements and blood. No evidence of blood loss anemia suspect that this may be more body response to hypotension from sepsis. No signs of any abdominal infection. Patient is not having any abdominal pain.  Code Status: Full code  Family Communication: Left msg with daughter  Disposition Plan: Likely will need several days of diuresis, not counting any plans for further cardiac evaluation   Consultants:  Cardiology  Procedures: Echocardiogram done 8/26: Sodium decreased ejection fraction of 45%, grade 1 diastolic dysfunction and severe aortic valve stenosis  Antibiotics:  IV vancomycin 8/251 dose  IV Zosyn 8/251 dose  IV Rocephin 8/25-present   Objective: BP 111/61 mmHg  Pulse 78  Temp(Src) 97.6 F (36.4 C) (Oral)  Resp 20  Ht 6' (1.829 m)  Wt 79.8 kg (175 lb 14.8 oz)  BMI 23.85 kg/m2  SpO2 98%  Intake/Output Summary (Last 24 hours) at 09/08/14 1047 Last data filed at 09/08/14 0700  Gross per 24 hour  Intake   2570 ml  Output    600 ml  Net   1970 ml   Filed Weights   09/06/14 0500 09/07/14 0600 09/08/14 0500  Weight: 78.8 kg (173 lb 11.6 oz) 78.3 kg (172 lb 9.9 oz) 79.8 kg (175 lb 14.8 oz)    Exam:   General:  Alert and oriented 3, no acute distress  Cardiovascular: Regular rate and rhythm, 3/6  holosystolic murmur  Respiratory: Clear to auscultation bilaterally, no crackles  Abdomen: Soft, nontender, nondistended, hypoactive bowel sounds  Musculoskeletal: No clubbing or cyanosis or edema   Data  Reviewed: Basic Metabolic Panel:  Recent Labs Lab 09/05/14 1347 09/06/14 0405 09/07/14 0408 09/08/14 0553  NA 142 142 139 140  K 4.4 4.9 4.6 4.3  CL 112* 119* 119* 120*  CO2 19* 16* 14* 14*  GLUCOSE 86 116* 108* 101*  BUN 43* 35* 29* 26*  CREATININE 2.65* 1.89* 1.43* 1.34*  CALCIUM 8.8* 8.0* 7.9* 7.9*  MG 1.8  --   --   --    Liver Function Tests:  Recent Labs Lab 09/05/14 1347 09/06/14 0405  AST 23 121*  ALT 13* 30  ALKPHOS 91 70  BILITOT 1.4* 0.7  PROT 6.7 6.2*  ALBUMIN 4.0 3.3*    Recent Labs Lab 09/05/14 1347  LIPASE 24   No results for input(s): AMMONIA in the last 168 hours. CBC:  Recent Labs Lab 09/05/14 1347 09/06/14 0405 09/07/14 0408 09/08/14 0553  WBC 10.0 15.0* 10.5 7.6  NEUTROABS 9.0*  --   --   --   HGB 12.1* 11.8* 11.5* 10.7*  HCT 38.6* 36.6* 35.3* 32.1*  MCV 101.8* 102.2* 101.1* 100.0  PLT 173 189 161 159   Cardiac Enzymes:    Recent Labs Lab 09/06/14 0100 09/06/14 0717 09/06/14 1450 09/07/14 0408 09/08/14 0906  TROPONINI 20.51* 48.92* 84.75* 50.08* 25.07*   BNP (last 3 results) No results for input(s): BNP in the last 8760 hours.  ProBNP (last 3 results) No results for input(s): PROBNP in the last 8760 hours.  CBG: No results for input(s): GLUCAP in the last 168 hours.  Recent Results (from the past 240 hour(s))  Blood culture (routine x 2)     Status: None (Preliminary result)   Collection Time: 09/05/14  1:38 PM  Result Value Ref Range Status   Specimen Description BLOOD RIGHT FOREARM  Final   Special Requests BOTTLES DRAWN AEROBIC AND ANAEROBIC  Final   Culture   Final    NO GROWTH 2 DAYS Performed at [facility]    Report Status PENDING  Incomplete  Blood culture (routine x 2)     Status: None (Preliminary result)   Collection Time: 09/05/14  1:46 PM  Result Value Ref Range Status   Specimen Description BLOOD RIGHT ANTECUBITAL  Final   Special Requests BOTTLES DRAWN AEROBIC AND ANAEROBIC   Final   Culture   Final    NO GROWTH 2 DAYS Performed at [facility]    Report Status PENDING  Incomplete  Urine culture     Status: None   Collection Time: 09/05/14  2:42 PM  Result Value Ref Range Status   Specimen Description URINE, CATHETERIZED  Final   Special Requests NONE  Final   Culture   Final    NO GROWTH 1 DAY Performed at [facility]    Report Status 09/06/2014 FINAL  Final  Stool culture     Status: None (Preliminary result)   Collection Time: 09/05/14  4:12 PM  Result Value Ref Range Status   Specimen Description STOOL  Final   Special Requests Normal  Final   Culture   Final    Culture reincubated for better growth Performed at [facility]    Report Status PENDING  Incomplete  C difficile quick scan w PCR reflex     Status: None   Collection Time: 09/05/14  4:12 PM  Result Value Ref Range Status   C Diff antigen NEGATIVE NEGATIVE Final   C Diff toxin NEGATIVE NEGATIVE Final   C Diff interpretation Negative for toxigenic C. difficile  Final  MRSA PCR Screening     Status: None   Collection Time: 09/05/14  8:59 PM  Result Value Ref Range Status   MRSA by PCR NEGATIVE NEGATIVE Final    Comment:        The GeneXpert MRSA Assay (FDA approved for NASAL specimens only), is one component of a comprehensive MRSA colonization surveillance program. It is not intended to diagnose MRSA infection nor to guide or monitor treatment for MRSA infections.      Studies: No results found.  Scheduled Meds: . aspirin EC  81 mg Oral Daily  . cefTRIAXone (ROCEPHIN)  IV  1 g Intravenous Q24H  . enoxaparin (LOVENOX) injection  40 mg Subcutaneous Q24H  . furosemide  20 mg Intravenous BID  . LORazepam  0.5 mg Oral QHS  . metoprolol tartrate  12.5 mg Oral BID  . nitroGLYCERIN  0.5 inch Topical 4 times per day  . sodium chloride  3 mL Intravenous Q12H  . tamsulosin  0.4 mg Oral Daily    Continuous Infusions:     Time spent: 25  minutes  Hollice Espy  [facility] Pager [phone]. If 7PM-7AM, please contact night-coverage at [email or web address], password [facility] 09/08/2014, 10:47 AM  LOS: 3 days

## 2014-09-08 NOTE — Progress Notes (Signed)
Patient wanted to get up to bedside commode. Patient has good strength in legs but got very dizzy and short of breath. Immediately sat back down. Waiting for PT evaluation. Patients understands that he is not to get out of bed without assistance.

## 2014-09-08 NOTE — Progress Notes (Signed)
SUBJECTIVE: Tried getting up on his own earlier to use commode and became dizzy and short of breath. Denies chest pain, dizziness, and shortness of breath at present.  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    Intake/Output Summary (Last 24 hours) at 09/08/14 0947 Last data filed at 09/08/14 0700  Gross per 24 hour  Intake   2790 ml  Output    600 ml  Net   2190 ml    Current Facility-Administered Medications  Medication Dose Route Frequency Provider Last Rate Last Dose  . acetaminophen (TYLENOL) tablet 650 mg  650 mg Oral Q6H PRN Hollice Espy, MD       Or  . acetaminophen (TYLENOL) suppository 650 mg  650 mg Rectal Q6H PRN Hollice Espy, MD      . aspirin EC tablet 81 mg  81 mg Oral Daily Hollice Espy, MD   81 mg at 09/07/14 0930  . cefTRIAXone (ROCEPHIN) 1 g in dextrose 5 % 50 mL IVPB  1 g Intravenous Q24H Anh P Pham, RPH   1 g at 09/07/14 2314  . enoxaparin (LOVENOX) injection 40 mg  40 mg Subcutaneous Q24H Hollice Espy, MD   40 mg at 09/07/14 2120  . furosemide (LASIX) injection 20 mg  20 mg Intravenous BID Hollice Espy, MD      . LORazepam (ATIVAN) tablet 0.5 mg  0.5 mg Oral QHS Hollice Espy, MD   0.5 mg at 09/07/14 2120  . nitroGLYCERIN (NITROGLYN) 2 % ointment 0.5 inch  0.5 inch Topical 4 times per day Hollice Espy, MD   0.5 inch at 09/08/14 0552  . ondansetron (ZOFRAN) tablet 4 mg  4 mg Oral Q6H PRN Hollice Espy, MD       Or  . ondansetron (ZOFRAN) injection 4 mg  4 mg Intravenous Q6H PRN Hollice Espy, MD      . oxyCODONE-acetaminophen (PERCOCET/ROXICET) 5-325 MG per tablet 1 tablet  1 tablet Oral Q4H PRN Alison Murray, MD   1 tablet at 09/08/14 0354  . sodium chloride 0.9 % injection 3 mL  3 mL Intravenous Q12H Hollice Espy, MD   3 mL at 09/07/14 2200  . tamsulosin (FLOMAX) capsule 0.4 mg  0.4 mg Oral Daily Hollice Espy, MD   0.4 mg at 09/07/14  0931    Filed Vitals:   09/07/14 1400 09/07/14 1959 09/08/14 0500 09/08/14 0548  BP: 138/72 118/77  111/61  Pulse:  85  78  Temp: 97.4 F (36.3 C) 97.6 F (36.4 C)  97.6 F (36.4 C)  TempSrc: Oral Oral  Oral  Resp: Height:      Weight:   175 lb 14.8 oz (79.8 kg)   SpO2: 92% 100%  98%    PHYSICAL EXAM General: NAD HEENT: Normal. Neck: No JVD, no thyromegaly.  Lungs: Clear to auscultation bilaterally with normal respiratory effort. CV: Nondisplaced PMI. Regular rate and rhythm, normal S1/S2, no S3/S4, 3/6 late peaking ejection systolic murmur heard throughout precordium. No pretibial edema.  Abdomen: Soft, nontender, no distention.  Neurologic: Alert and oriented x 3.  Psych: Normal affect. Musculoskeletal: No gross deformities. Extremities: No clubbing or cyanosis.   TELEMETRY: Reviewed telemetry pt in sinus rhythm.  LABS: Basic Metabolic Panel:  Recent Labs  16/10/96 1347  09/07/14 0408 09/08/14  0553  NA 142  < > 139 140  K 4.4  < > 4.6 4.3  CL 112*  < > 119* 120*  CO2 19*  < > 14* 14*  GLUCOSE 86  < > 108* 101*  BUN 43*  < > 29* 26*  CREATININE 2.65*  < > 1.43* 1.34*  CALCIUM 8.8*  < > 7.9* 7.9*  MG 1.8  --   --   --   < > = values in this interval not displayed. Liver Function Tests:  Recent Labs  09/05/14 1347 09/06/14 0405  AST 23 121*  ALT 13* 30  ALKPHOS 91 70  BILITOT 1.4* 0.7  PROT 6.7 6.2*  ALBUMIN 4.0 3.3*    Recent Labs  09/05/14 1347  LIPASE 24   CBC:  Recent Labs  09/05/14 1347  09/07/14 0408 09/08/14 0553  WBC 10.0  < > 10.5 7.6  NEUTROABS 9.0*  --   --   --   HGB 12.1*  < > 11.5* 10.7*  HCT 38.6*  < > 35.3* 32.1*  MCV 101.8*  < > 101.1* 100.0  PLT 173  < > 161 159  < > = values in this interval not displayed. Cardiac Enzymes:  Recent Labs  09/06/14 0717 09/06/14 1450 09/07/14 0408  TROPONINI 48.92* 84.75* 50.08*   BNP: Invalid input(s): POCBNP D-Dimer: No results for input(s): DDIMER in the  last 72 hours. Hemoglobin A1C: No results for input(s): HGBA1C in the last 72 hours. Fasting Lipid Panel: No results for input(s): CHOL, HDL, LDLCALC, TRIG, CHOLHDL, LDLDIRECT in the last 72 hours. Thyroid Function Tests: No results for input(s): TSH, T4TOTAL, T3FREE, THYROIDAB in the last 72 hours.  Invalid input(s): FREET3 Anemia Panel: No results for input(s): VITAMINB12, FOLATE, FERRITIN, TIBC, IRON, RETICCTPCT in the last 72 hours.  RADIOLOGY: Dg Chest 2 View  09/05/2014   CLINICAL DATA:  Diarrhea and vomiting today, difficulty urinating for 1.5 weeks, history UTIs, coronary artery disease post MI, stroke, pneumonia  EXAM: CHEST  2 VIEW  COMPARISON:  02/03/2013  FINDINGS: Enlargement of cardiac silhouette post CABG.  Slight pulmonary vascular congestion.  Chronic interstitial changes in mid to lower lungs with RIGHT basilar atelectasis and decreased lung volumes.  No definite infiltrate, pleural effusion or pneumothorax.  IMPRESSION: Enlargement of cardiac silhouette post CABG.  Chronic inches disease and RIGHT basilar atelectasis.   Electronically Signed   By: Ulyses Southward M.D.   On: 09/05/2014 15:07      ASSESSMENT AND PLAN: 1. NSTEMI: Troponins trending down, 50 on 8/27, pending today. EF 40-45%. Continue ASA. BP remains stable today. I will start low dose metoprolol 12.5 mg bid. Continue ASA. Outpatient nuclear stress test to see if large ischemic territories. Not a candidate for cath at present.  2. Moderate to severe aortic stenosis: Possible TAVR candidate in future. Moderate by echoDefer to his cardiologist, [name].   Prentice Docker, M.D., F.A.C.C.

## 2014-09-08 NOTE — Care Management Important Message (Signed)
Important Message  Patient Details  Name: [name] MRN: [id number] Date of Birth: 15-Nov-1924   Medicare Important Message Given:  Yes-second notification given    Antony Haste, RN 09/08/2014, 11:27 AM

## 2014-09-09 ENCOUNTER — Encounter (HOSPITAL_COMMUNITY): Payer: Self-pay | Admitting: Physician Assistant

## 2014-09-09 LAB — BASIC METABOLIC PANEL
Anion gap: 9 (ref 5–15)
BUN: 25 mg/dL — AB (ref 6–20)
CHLORIDE: 117 mmol/L — AB (ref 101–111)
CO2: 15 mmol/L — AB (ref 22–32)
CREATININE: 1.38 mg/dL — AB (ref 0.61–1.24)
Calcium: 8.2 mg/dL — ABNORMAL LOW (ref 8.9–10.3)
GFR calc Af Amer: 51 mL/min — ABNORMAL LOW (ref >60)
GFR calc non Af Amer: 44 mL/min — ABNORMAL LOW (ref >60)
Glucose, Bld: 96 mg/dL (ref 65–99)
Potassium: 3.9 mmol/L (ref 3.5–5.1)
Sodium: 141 mmol/L (ref 135–145)

## 2014-09-09 LAB — STOOL CULTURE: Special Requests: NORMAL

## 2014-09-09 MED ORDER — METOPROLOL SUCCINATE ER 25 MG PO TB24
12.5000 mg | ORAL_TABLET | Freq: Two times a day (BID) | ORAL | Status: DC
  Administered 2014-09-09 – 2014-09-12 (×6): 12.5 mg via ORAL
  Filled 2014-09-09 (×6): qty 1

## 2014-09-09 MED ORDER — ISOSORBIDE MONONITRATE 15 MG HALF TABLET
15.0000 mg | ORAL_TABLET | Freq: Every day | ORAL | Status: DC
  Administered 2014-09-09 – 2014-09-12 (×4): 15 mg via ORAL
  Filled 2014-09-09 (×4): qty 1

## 2014-09-09 NOTE — Care Management Note (Signed)
Case Management Note  Patient Details  Name: [name] MRN: [id number] Date of Birth: Nov 18, 1924  Subjective/Objective: PT recc HHPT. [facility] chosen for [facility]. TC [facility] rep Kristen rep aware of referral. Await HHPT order.   Noted 02 sats ra.                 Action/Plan:d/c plan home w/HHC.   Expected Discharge Date:   (unknown)               Expected Discharge Plan:  Home w Home Health Services  In-House Referral:  NA  Discharge planning Services  CM Consult  Post Acute Care Choice:  NA Choice offered to:  Patient  DME Arranged:    DME Agency:     HH Arranged:    HH Agency:  [facility]  Status of Service:  In process, will continue to follow  Medicare Important Message Given:  Yes-second notification given Date Medicare IM Given:    Medicare IM give by:    Date Additional Medicare IM Given:    Additional Medicare Important Message give by:     If discussed at Long Length of Stay Meetings, dates discussed:    Additional Comments:  Lanier Clam, RN 09/09/2014, 3:58 PM

## 2014-09-09 NOTE — Progress Notes (Signed)
Patient Name: [name] Date of Encounter: 09/09/2014  Principal Problem:   Sepsis Active Problems:   Essential hypertension   CKD (chronic kidney disease), stage III   NSTEMI (non-ST elevated myocardial infarction)   Severe aortic stenosis   PVD (peripheral vascular disease)   BRBPR (bright red blood per rectum)   UTI (lower urinary tract infection)   AKI (acute kidney injury)   Chronic combined systolic and diastolic congestive heart failure   Primary Cardiologist: Dr Anne Fu  Patient Profile: 81 yo male w/ hx CABG 2004 (LIMA-dLAD, SVG-OM1-OM2, SVG-AM), echo 04/2014 w/ EF 50%, L subclavian 80%, cath 2005 w/ SVG-OM1-OM2 occluded, other grafts patent, LIMA-dLAD jeopardized by subclavian stenosis, CKD, CVA, BPH, BRBPR. Admitted 08/25 w/ Urosepsis, cards following for NSTEMI.   SUBJECTIVE: Got light-headed when he got up for the first time yesterday. No chest pain. Denies SOB but resp rate increases w/ conversation  OBJECTIVE Filed Vitals:   09/08/14 0548 09/08/14 1500 09/08/14 2006 09/09/14 0514  BP: 111/61 136/79 121/70 116/79  Pulse: 78 77 98 77  Temp: 97.6 F (36.4 C) 97.4 F (36.3 C) 97.6 F (36.4 C) 98.6 F (37 C)  TempSrc: Oral Oral Oral Axillary  Resp: 20 18 18 20   Height:      Weight:    171 lb 8.3 oz (77.8 kg)  SpO2: 98% 100% 98% 100%    Intake/Output Summary (Last 24 hours) at 09/09/14 0910 Last data filed at 09/09/14 0513  Gross per 24 hour  Intake    410 ml  Output   2070 ml  Net  -1660 ml   Filed Weights   09/07/14 0600 09/08/14 0500 09/09/14 0514  Weight: 172 lb 9.9 oz (78.3 kg) 175 lb 14.8 oz (79.8 kg) 171 lb 8.3 oz (77.8 kg)    PHYSICAL EXAM General: Well developed, well nourished, male in no acute distress. Head: Normocephalic, atraumatic.  Neck: Supple without bruits, JVD 10 cm. Lungs:  Resp regular and unlabored, rales bases. Heart: RRR, S1, S2, no S3, S4, 2/6 murmur; no rub. Abdomen: Soft, non-tender, non-distended, BS + x 4.    Extremities: No clubbing, cyanosis, edema.  Neuro: Alert and oriented X 3. Moves all extremities spontaneously. Psych: Normal affect.  LABS: CBC: Recent Labs  09/07/14 0408 09/08/14 0553  WBC 10.5 7.6  HGB 11.5* 10.7*  HCT 35.3* 32.1*  MCV 101.1* 100.0  PLT 161 159   Basic Metabolic Panel: Recent Labs  09/08/14 0553 09/09/14 0423  NA 140 141  K 4.3 3.9  CL 120* 117*  CO2 14* 15*  GLUCOSE 101* 96  BUN 26* 25*  CREATININE 1.34* 1.38*  CALCIUM 7.9* 8.2*   Liver Function Tests: Lab Results  Component Value Date   ALT 30 09/06/2014   AST 121* 09/06/2014   ALKPHOS 70 09/06/2014   BILITOT 0.7 09/06/2014   Cardiac Enzymes: Recent Labs  09/06/14 1450 09/07/14 0408 09/08/14 0906  TROPONINI 84.75* 50.08* 25.07*   Fasting Lipid Panel:No results for input(s): CHOL, HDL, LDLCALC, TRIG, CHOLHDL, LDLDIRECT in the last 72 hours.  TELE: SR, frequent non-conducted PACs.       ECHO: 09/06/2014  - Left ventricle: The cavity size was normal. There was mild focal basal hypertrophy of the septum. Systolic function was mildly to moderately reduced. The estimated ejection fraction was in the range of 40% to 45%. There is akinesis of the basalinferior myocardium. There is akinesis of the entireinferolateral myocardium. There was an increased relative contribution of atrial contraction  to ventricular filling. Doppler parameters are consistent with abnormal left ventricular relaxation (grade 1 diastolic dysfunction). - Aortic valve: The AV appears at least moderately stenotic. By peak AV velocity and mean gradient there appears to be moderate stenosis but by calculated AVA it appears severely stenotic. The mean AV gradient and peak velocity may be underestimated due to reduced LVF. Consider dobutamine stress echo for further assessment of degree of AS. Severely calcified annulus. Trileaflet. Severe diffuse thickening and calcification.  Valve mobility was restricted. There was moderate stenosis. There was moderate regurgitation. Valve area (VTI): 0.7 cm^2. Valve area (Vmax): 0.72 cm^2. Valve area (Vmean): 0.69 cm^2.   Mean gradient (S): 31 mm Hg. Peak gradient (S): 49 mm Hg. - Mitral valve: Mild calcification, with mild involvement of chords. There was mild regurgitation. - Left atrium: The atrium was moderately dilated. - Right ventricle: The cavity size was mildly dilated. Wall thickness was normal.  Radiology/Studies: Dg Chest 2 View  09/05/2014   CLINICAL DATA:  Diarrhea and vomiting today, difficulty urinating for 1.5 weeks, history UTIs, coronary artery disease post MI, stroke, pneumonia  EXAM: CHEST  2 VIEW  COMPARISON:  02/03/2013  FINDINGS: Enlargement of cardiac silhouette post CABG.  Slight pulmonary vascular congestion.  Chronic interstitial changes in mid to lower lungs with RIGHT basilar atelectasis and decreased lung volumes.  No definite infiltrate, pleural effusion or pneumothorax.  IMPRESSION: Enlargement of cardiac silhouette post CABG.  Chronic inches disease and RIGHT basilar atelectasis.   Electronically Signed   By: Ulyses Southward M.D.   On: 09/05/2014 15:07    Current Medications:  . aspirin EC  81 mg Oral Daily  . cefTRIAXone (ROCEPHIN)  IV  1 g Intravenous Q24H  . enoxaparin (LOVENOX) injection  40 mg Subcutaneous Q24H  . furosemide  20 mg Intravenous BID  . LORazepam  0.5 mg Oral QHS  . metoprolol tartrate  12.5 mg Oral BID  . nitroGLYCERIN  0.5 inch Topical 4 times per day  . sodium chloride  3 mL Intravenous Q12H  . tamsulosin  0.4 mg Oral Daily      ASSESSMENT AND PLAN:  NSTEMI (non-ST elevated myocardial infarction) - Peak Troponin 84.75, was 25.07 08/28 - new WMA on echo w/ decreased EF - no ACE/ARB 2nd acute on chronic renal insufficiency - BB added 08/28 after BP improved - no statin as pt had elevated AST on admission - will ck lipids and LFTS in am, may be able to  restart Vytorin 10-40     Severe Aortic Stenosis - known for a long time - 04/2014 Echo w/ Mean gradient (S): 45 mm Hg. Peak gradient (S): 75 mm Hg. - gradients now  31 mm Hg and 49 mm Hg. less but EF is lower - f/u as OP, watch for orthostatic sx w/ diuresis    Chronic combined systolic and diastolic congestive heart failure   - currently on low-dose IV Lasix - still w/ volume overload by exam - weights PTA were in the low 160s or 150s - continue diuresis and follow renal function  Otherwise, per IM Principal Problem:   Sepsis Active Problems:   Essential hypertension   CKD (chronic kidney disease), stage III   PVD (peripheral vascular disease)   BRBPR (bright red blood per rectum)   UTI (lower urinary tract infection)   AKI (acute kidney injury)    Signed, Barrett, Rhonda , PA-C 9:10 AM 09/09/2014 History reviewed. Presented with UTI and BRBPR and acute on chronic renal insufficiency.  No chest pain but had significant elevation of troponin peak 84.75.  Conservative treatment for his NSTEMI.  Long history of aortic stenosis followed by [name]. He has an appointment with [name] at the end of September at which time he expects [name] to continue to discuss possible surgery on his aortic valve.  Previous echo showed severe AS with EF 50-55% and peak aortic valve gradient of 75 mmhg and mean gradient of 45 mmHg.  Now with his recent NSTEMI his LV function has decreased to 40-45% and his aortic valve gradients have fallen accordingly.  Prior to admission the patient was relatively asymptomatic in regard to his AS. At this point plan will be to continue ASA, BB and low dose lasix as outpatient. Will switch back to imdur.  Lipids and LFTs to be checked tomorrow and consider restarting statin. Further management of his AS as outpatient with [name].  The severity of his AS has already been demonstrated so no indication for dobutamine echo at this time.

## 2014-09-09 NOTE — Evaluation (Signed)
Physical Therapy Evaluation Patient Details Name: [name] MRN: [id number] DOB: 1924/06/26 Today's Date: 09/09/2014   History of Present Illness  Patient is an 81 year old male past history of CAD status post CABG, stage III chronic kidney disease and severe aortic stenosis admitted on 8/25 with septic shock secondary to UTI  Clinical Impression  On eval, pt required Min assist for mobility-walked ~50 feet with RW. O2 sats 84-86% on RA after ambulation. Dyspnea 2/4. Pt fatigues fairly easily. Pt states he will return home with daughter assisting. Recommend HHPT and 24 hour supervision/assist initially.     Follow Up Recommendations Home health PT;Supervision/Assistance - 24 hour (initially)    Equipment Recommendations  None recommended by PT    Recommendations for Other Services OT consult     Precautions / Restrictions Precautions Precautions: Fall Restrictions Weight Bearing Restrictions: No      Mobility  Bed Mobility   Transfers Overall transfer level: Needs assistance Equipment used: Rolling walker (2 wheeled) Transfers: Sit to/from Stand Sit to Stand: Min assist        General transfer comment: VC for safety and hand placement. Assist to rise, stabilize, control descent  Ambulation/Gait Ambulation/Gait assistance: Min assist Ambulation Distance (Feet): 50 Feet Assistive device: Rolling walker (2 wheeled) Gait Pattern/deviations: Step-through pattern;Trunk flexed     General Gait Details: assist to stabilize pt and maneuver safely with RW. O2 sats 84% on RA after ambulation-replaced Tucker O2 end of session. Dyspnea 2/4. 2 brief standing rest breaks taken/needed.   Stairs            Wheelchair Mobility    Modified Rankin (Stroke Patients Only)       Balance Overall balance assessment: Needs assistance         Standing balance support: Bilateral upper extremity supported;During functional activity Standing balance-Leahy Scale: Poor Standing  balance comment: needs RW for support                             Pertinent Vitals/Pain Pain Assessment: No/denies pain Pain Score: 4  Pain Location: r shoulder Pain Descriptors / Indicators: Sore Pain Intervention(s): Monitored during session    Home Living Family/patient expects to be discharged to:: Private residence Living Arrangements: Alone   Type of Home: Apartment       Home Layout: One level Home Equipment: Walker - 2 wheels      Prior Function Level of Independence: Independent;Independent with assistive device(s)         Comments: uses walker to go get mail     Hand Dominance        Extremity/Trunk Assessment   Upper Extremity Assessment: Defer to OT evaluation RUE Deficits / Details: very limited BUe shoulder flexion due to arthritis         Lower Extremity Assessment: Generalized weakness      Cervical / Trunk Assessment: Kyphotic  Communication   Communication: No difficulties  Cognition Arousal/Alertness: Awake/alert Behavior During Therapy: WFL for tasks assessed/performed Overall Cognitive Status: Within Functional Limits for tasks assessed                      General Comments      Exercises        Assessment/Plan    PT Assessment Patient needs continued PT services  PT Diagnosis Difficulty walking;Generalized weakness   PT Problem List Decreased strength;Decreased activity tolerance;Decreased balance;Decreased mobility;Decreased knowledge of use of DME  PT Treatment  Interventions DME instruction;Gait training;Functional mobility training;Therapeutic activities;Patient/family education;Therapeutic exercise;Balance training   PT Goals (Current goals can be found in the Care Plan section) Acute Rehab PT Goals Patient Stated Goal: go home tomorrow at 11 PT Goal Formulation: With patient Time For Goal Achievement: 09/23/14 Potential to Achieve Goals: Good    Frequency Min 3X/week   Barriers to discharge         Co-evaluation               End of Session Equipment Utilized During Treatment: Gait belt Activity Tolerance: Patient limited by fatigue Patient left: in chair;with call bell/phone within reach;with chair alarm set           Time: 1455-1515 PT Time Calculation (min) (ACUTE ONLY): 20 min   Charges:   PT Evaluation $Initial PT Evaluation Tier I: 1 Procedure     PT G Codes:        Rebeca Alert, MPT Pager: [phone]

## 2014-09-09 NOTE — Progress Notes (Signed)
PROGRESS NOTE  [name] UJW:[id number] DOB: [birth year] DOA: 09/05/2014 PCP: Tana Conch, MD  HPI/Recap of past 24 hours: Patient is an 81 year old male past mental history of CAD status post CABG, stage III chronic kidney disease and severe aortic stenosis admitted on 8/25 with septic shock secondary to UTI. Initial lab work significant for acute kidney injury with creatinine at 2.65 felt to be secondary to hypovolemia. Patient started on IV antibiotics and IV fluids. Initial troponin elevated at 1.24. Second troponin came back markedly elevated at 20 and continued to rise peaking at 84, before trending downward. Patient himself has remained chest pain-free. As lactic acid level resolved, patient stabilized and transferred to floor.  Patient continues to do well. Diuretic started. No chest pain or shortness of breath.  Assessment/Plan: Principal Problem:   Sepsis secondary UTI: Patient meets criteria given lactic acidosis, fever and tachycardia with urinary source. Suspect increase in white count may be more due to stress margination from non-ST elevated MI. Urine cultures show no growth yet No other source of infection noted.   Active Problems:   Essential hypertension: Holding antihypertensives given borderline low pressures. Now that stable, beta blocker started.    Acute kidney injury in the setting of CKD (chronic kidney disease), stage FAO:[id number] to improve, near baseline  Continue IV fluids.    NSTEMI (non-ST elevated myocardial infarction): Likely started prior to admission. Secondary to sepsis. Being followed by cardiology. Have added nitroglycerin patch. Recommendation is to try to manage conservatively and treat sepsis.  Echocardiogram notes mildly decreased ejection fraction, diastolic dysfunction and stenosed aortic valve. Metoprolol started. Cardiology restarting Imdur today. Outpatient nuclear stress test. Not a candidate for catheterization    Severe aortic  stenosis/CAD/chronic diastolic heart failure: Unfortunately with aggressive fluid resuscitation likely leading to volume overload. Monitor for flash pulmonary edema. Another renal function normalized and he is post-sepsis, we'll start gentle diuresis  Bright red blood per rectum: Patient initial presentation with sudden onset chills and rigors while using the commode. He had several loose bowel movements and blood. No evidence of blood loss anemia suspect that this may be more body response to hypotension from sepsis. No signs of any abdominal infection. Patient is not having any abdominal pain.  Code Status: Full code  Family Communication: Spoke with daughter by phone 8/28  Disposition Plan: Likely will need several days of diuresis, not counting any plans for further cardiac evaluation, home with home health. Possibly Wednesday   Consultants:  Cardiology  Procedures: Echocardiogram done 8/26: Sodium decreased ejection fraction of 45%, grade 1 diastolic dysfunction and severe aortic valve stenosis  Antibiotics:  IV vancomycin 8/251 dose  IV Zosyn 8/251 dose  IV Rocephin 8/25-present   Objective: BP 116/79 mmHg  Pulse 77  Temp(Src) 98.6 F (37 C) (Axillary)  Resp 20  Ht 6' (1.829 m)  Wt 77.8 kg (171 lb 8.3 oz)  BMI 23.26 kg/m2  SpO2 100%  Intake/Output Summary (Last 24 hours) at 09/09/14 1449 Last data filed at 09/09/14 1255  Gross per 24 hour  Intake    410 ml  Output   2870 ml  Net  -2460 ml   Filed Weights   09/07/14 0600 09/08/14 0500 09/09/14 0514  Weight: 78.3 kg (172 lb 9.9 oz) 79.8 kg (175 lb 14.8 oz) 77.8 kg (171 lb 8.3 oz)    Exam: Little change from previous day  General:  Alert and oriented 3, no acute distress  Cardiovascular: Regular rate and rhythm, 3/6 holosystolic murmur  Respiratory: Clear to auscultation bilaterally, no crackles  Abdomen: Soft, nontender, nondistended, hypoactive bowel sounds  Musculoskeletal: No clubbing or cyanosis  or edema   Data Reviewed: Basic Metabolic Panel:  Recent Labs Lab 09/05/14 1347 09/06/14 0405 09/07/14 0408 09/08/14 0553 09/09/14 0423  NA 142 142 139 140 141  K 4.4 4.9 4.6 4.3 3.9  CL 112* 119* 119* 120* 117*  CO2 19* 16* 14* 14* 15*  GLUCOSE 86 116* 108* 101* 96  BUN 43* 35* 29* 26* 25*  CREATININE 2.65* 1.89* 1.43* 1.34* 1.38*  CALCIUM 8.8* 8.0* 7.9* 7.9* 8.2*  MG 1.8  --   --   --   --    Liver Function Tests:  Recent Labs Lab 09/05/14 1347 09/06/14 0405  AST 23 121*  ALT 13* 30  ALKPHOS 91 70  BILITOT 1.4* 0.7  PROT 6.7 6.2*  ALBUMIN 4.0 3.3*    Recent Labs Lab 09/05/14 1347  LIPASE 24   No results for input(s): AMMONIA in the last 168 hours. CBC:  Recent Labs Lab 09/05/14 1347 09/06/14 0405 09/07/14 0408 09/08/14 0553  WBC 10.0 15.0* 10.5 7.6  NEUTROABS 9.0*  --   --   --   HGB 12.1* 11.8* 11.5* 10.7*  HCT 38.6* 36.6* 35.3* 32.1*  MCV 101.8* 102.2* 101.1* 100.0  PLT 173 189 161 159   Cardiac Enzymes:    Recent Labs Lab 09/06/14 0100 09/06/14 0717 09/06/14 1450 09/07/14 0408 09/08/14 0906  TROPONINI 20.51* 48.92* 84.75* 50.08* 25.07*   BNP (last 3 results) No results for input(s): BNP in the last 8760 hours.  ProBNP (last 3 results) No results for input(s): PROBNP in the last 8760 hours.  CBG: No results for input(s): GLUCAP in the last 168 hours.  Recent Results (from the past 240 hour(s))  Blood culture (routine x 2)     Status: None (Preliminary result)   Collection Time: 09/05/14  1:38 PM  Result Value Ref Range Status   Specimen Description BLOOD RIGHT FOREARM  Final   Special Requests BOTTLES DRAWN AEROBIC AND ANAEROBIC  Final   Culture   Final    NO GROWTH 4 DAYS Performed at [facility]    Report Status PENDING  Incomplete  Blood culture (routine x 2)     Status: None (Preliminary result)   Collection Time: 09/05/14  1:46 PM  Result Value Ref Range Status   Specimen Description BLOOD RIGHT  ANTECUBITAL  Final   Special Requests BOTTLES DRAWN AEROBIC AND ANAEROBIC  Final   Culture   Final    NO GROWTH 4 DAYS Performed at [facility]    Report Status PENDING  Incomplete  Urine culture     Status: None   Collection Time: 09/05/14  2:42 PM  Result Value Ref Range Status   Specimen Description URINE, CATHETERIZED  Final   Special Requests NONE  Final   Culture   Final    NO GROWTH 1 DAY Performed at [facility]    Report Status 09/06/2014 FINAL  Final  Stool culture     Status: None   Collection Time: 09/05/14  4:12 PM  Result Value Ref Range Status   Specimen Description STOOL  Final   Special Requests Normal  Final   Culture   Final    NO SALMONELLA, SHIGELLA, CAMPYLOBACTER, YERSINIA, OR E.COLI 0157:H7 ISOLATED Performed at [facility]    Report Status 09/09/2014 FINAL  Final  C difficile quick scan w  PCR reflex     Status: None   Collection Time: 09/05/14  4:12 PM  Result Value Ref Range Status   C Diff antigen NEGATIVE NEGATIVE Final   C Diff toxin NEGATIVE NEGATIVE Final   C Diff interpretation Negative for toxigenic C. difficile  Final  MRSA PCR Screening     Status: None   Collection Time: 09/05/14  8:59 PM  Result Value Ref Range Status   MRSA by PCR NEGATIVE NEGATIVE Final    Comment:        The GeneXpert MRSA Assay (FDA approved for NASAL specimens only), is one component of a comprehensive MRSA colonization surveillance program. It is not intended to diagnose MRSA infection nor to guide or monitor treatment for MRSA infections.      Studies: No results found.  Scheduled Meds: . aspirin EC  81 mg Oral Daily  . cefTRIAXone (ROCEPHIN)  IV  1 g Intravenous Q24H  . enoxaparin (LOVENOX) injection  40 mg Subcutaneous Q24H  . furosemide  20 mg Intravenous BID  . isosorbide mononitrate  15 mg Oral Daily  . LORazepam  0.5 mg Oral QHS  . metoprolol succinate  12.5 mg Oral BID  . sodium chloride  3 mL Intravenous Q12H    . tamsulosin  0.4 mg Oral Daily    Continuous Infusions:     Time spent: 15 minutes  Hollice Espy  [facility] Pager [phone]. If 7PM-7AM, please contact night-coverage at [email or web address], password [facility] 09/09/2014, 2:49 PM  LOS: 4 days

## 2014-09-09 NOTE — Evaluation (Signed)
Occupational Therapy Evaluation Patient Details Name: [name] MRN: [id number] DOB: 1924-11-07 Today's Date: 09/09/2014    History of Present Illness Patient is an 81 year old male past mental history of CAD status post CABG, stage III chronic kidney disease and severe aortic stenosis admitted on 8/25 with septic shock secondary to UTI   Clinical Impression   Pt admitted with UTI. Pt currently with functional limitations due to the deficits listed below (see OT Problem List).  Pt will benefit from skilled OT to increase their safety and independence with ADL and functional mobility for ADL to facilitate discharge to venue listed below.      Follow Up Recommendations  SNF;Supervision - Intermittent;Supervision/Assistance - 24 hour;Home health OT;Other (comment) (depending on daugthers abiilty to help)          Precautions / Restrictions Precautions Precautions: Fall Restrictions Weight Bearing Restrictions: No      Mobility Bed Mobility Overal bed mobility: Needs Assistance Bed Mobility: Supine to Sit     Supine to sit: Mod assist        Transfers Overall transfer level: Needs assistance Equipment used: Rolling walker (2 wheeled) Transfers: Sit to/from [employer] Sit to Stand: Mod assist Stand pivot transfers: Mod assist       General transfer comment: VC for safety and hand placement    Balance                                            ADL Overall ADL's : Needs assistance/impaired Eating/Feeding: Set up;Sitting   Grooming: Minimal assistance;Sitting   Upper Body Bathing: Moderate assistance;Sitting   Lower Body Bathing: Cueing for sequencing;Cueing for safety;Sit to/from stand;Maximal assistance       Lower Body Dressing: Sit to/from stand;Maximal assistance   Toilet Transfer: RW;Moderate assistance Toilet Transfer Details (indicate cue type and reason): bed to chair Toileting- Clothing Manipulation and  Hygiene: Moderate assistance;Sit to/from stand         General ADL Comments: Pt lives alone and feels he will be fine at home.  OT suggested rehab or having increased A at home               Pertinent Vitals/Pain Pain Assessment: 0-10 Pain Score: 4  Pain Location: r shoulder Pain Descriptors / Indicators: Sore Pain Intervention(s): Monitored during session     Hand Dominance     Extremity/Trunk Assessment Upper Extremity Assessment Upper Extremity Assessment: Generalized weakness;RUE deficits/detail;LUE deficits/detail RUE Deficits / Details: very limited BUe shoulder flexion due to arthritis   Lower Extremity Assessment Lower Extremity Assessment: Defer to PT evaluation       Communication Communication Communication: No difficulties   Cognition Arousal/Alertness: Awake/alert Behavior During Therapy: WFL for tasks assessed/performed                                  Home Living Family/patient expects to be discharged to:: Private residence Living Arrangements: Alone   Type of Home: Apartment       Home Layout: One level     Bathroom Shower/Tub: Walk-in Human resources officer: Standard     Home Equipment: Environmental consultant - 2 wheels          Prior Functioning/Environment Level of Independence: Independent;Independent with assistive device(s)        Comments: uses walker to  go get mail    OT Diagnosis: Generalized weakness   OT Problem List: Decreased strength;Decreased activity tolerance;Impaired balance (sitting and/or standing)   OT Treatment/Interventions: Self-care/ADL training;DME and/or AE instruction;Patient/family education;Therapeutic exercise;Therapeutic activities    OT Goals(Current goals can be found in the care plan section) Acute Rehab OT Goals Patient Stated Goal: go home tomorrow at 11 OT Goal Formulation: With patient Time For Goal Achievement: 09/16/14 ADL Goals Pt Will Perform Grooming: with  supervision;standing Pt Will Perform Upper Body Dressing: with supervision;sitting Pt Will Perform Lower Body Dressing: with supervision;sit to/from stand Pt Will Transfer to Toilet: with supervision;ambulating;regular height toilet Pt Will Perform Toileting - Clothing Manipulation and hygiene: with supervision;sit to/from stand  OT Frequency: Min 2X/week   Barriers to D/C: Decreased caregiver support          Co-evaluation              End of Session Nurse Communication: Mobility status  Activity Tolerance: Patient tolerated treatment well Patient left: in chair;with call bell/phone within reach   Time: 1318-1350 OT Time Calculation (min): 32 min Charges:  OT General Charges $OT Visit: 1 Procedure OT Evaluation $Initial OT Evaluation Tier I: 1 Procedure OT Treatments $Self Care/Home Management : 8-22 mins G-Codes:    Einar Crow D 2014-09-16, 1:57 PM

## 2014-09-10 LAB — HEPATIC FUNCTION PANEL
ALT: 27 U/L (ref 17–63)
AST: 39 U/L (ref 15–41)
Albumin: 3.2 g/dL — ABNORMAL LOW (ref 3.5–5.0)
Alkaline Phosphatase: 65 U/L (ref 38–126)
Bilirubin, Direct: 0.1 mg/dL (ref 0.1–0.5)
Indirect Bilirubin: 0.5 mg/dL (ref 0.3–0.9)
Total Bilirubin: 0.6 mg/dL (ref 0.3–1.2)
Total Protein: 5.7 g/dL — ABNORMAL LOW (ref 6.5–8.1)

## 2014-09-10 LAB — BASIC METABOLIC PANEL WITH GFR
Anion gap: 10 (ref 5–15)
BUN: 23 mg/dL — ABNORMAL HIGH (ref 6–20)
CO2: 16 mmol/L — ABNORMAL LOW (ref 22–32)
Calcium: 8.4 mg/dL — ABNORMAL LOW (ref 8.9–10.3)
Chloride: 114 mmol/L — ABNORMAL HIGH (ref 101–111)
Creatinine, Ser: 1.42 mg/dL — ABNORMAL HIGH (ref 0.61–1.24)
GFR calc Af Amer: 49 mL/min — ABNORMAL LOW
GFR calc non Af Amer: 43 mL/min — ABNORMAL LOW
Glucose, Bld: 94 mg/dL (ref 65–99)
Potassium: 3.5 mmol/L (ref 3.5–5.1)
Sodium: 140 mmol/L (ref 135–145)

## 2014-09-10 LAB — LIPID PANEL
CHOLESTEROL: 116 mg/dL (ref 0–200)
HDL: 31 mg/dL — AB (ref >40)
LDL CALC: 62 mg/dL (ref 0–99)
TRIGLYCERIDES: 117 mg/dL (ref <150)
Total CHOL/HDL Ratio: 3.7 RATIO
VLDL: 23 mg/dL (ref 0–40)

## 2014-09-10 LAB — CULTURE, BLOOD (ROUTINE X 2)
CULTURE: NO GROWTH
Culture: NO GROWTH

## 2014-09-10 MED ORDER — EZETIMIBE-SIMVASTATIN 10-40 MG PO TABS
1.0000 | ORAL_TABLET | Freq: Every day | ORAL | Status: DC
  Administered 2014-09-10 – 2014-09-11 (×2): 1 via ORAL
  Filled 2014-09-10 (×3): qty 1

## 2014-09-10 MED ORDER — FUROSEMIDE 40 MG PO TABS
40.0000 mg | ORAL_TABLET | Freq: Every day | ORAL | Status: DC
  Administered 2014-09-11 – 2014-09-12 (×2): 40 mg via ORAL
  Filled 2014-09-10 (×2): qty 1

## 2014-09-10 NOTE — Care Management Note (Signed)
Case Management Note  Patient Details  Name: [name] MRN: [id number] Date of Birth: 1924-07-14  Subjective/Objective:    PT recc HHPT/24hr supv. [facility] rep Kristen aware of referral-HHPT will assess for safety in the home environment.24hr supv is not a skilled need, it is custodial level(insurance does not pay for). Will provide a private duty care list as resource.  If HHRN needed for med assessment or disease mgmnt, please order.              Action/Plan:d/c plan home w/HHC.   Expected Discharge Date:   (unknown)               Expected Discharge Plan:  Home w Home Health Services  In-House Referral:  NA  Discharge planning Services  CM Consult  Post Acute Care Choice:  NA Choice offered to:  Patient  DME Arranged:    DME Agency:     HH Arranged:    HH Agency:  [facility]  Status of Service:  In process, will continue to follow  Medicare Important Message Given:  Yes-second notification given Date Medicare IM Given:    Medicare IM give by:    Date Additional Medicare IM Given:    Additional Medicare Important Message give by:     If discussed at Long Length of Stay Meetings, dates discussed:    Additional Comments:  Lanier Clam, RN 09/10/2014, 11:54 AM

## 2014-09-10 NOTE — Progress Notes (Signed)
PROGRESS NOTE  [name] UJW:[id number] DOB: 12-Oct-1924 DOA: 09/05/2014 PCP: Tana Conch, MD  HPI/Recap of past 24 hours: Patient is an 81 year old male past mental history of CAD status post CABG, stage III chronic kidney disease and severe aortic stenosis admitted on 8/25 with septic shock secondary to UTI. Initial lab work significant for acute kidney injury with creatinine at 2.65 felt to be secondary to hypovolemia. Patient started on IV antibiotics and IV fluids. Initial troponin elevated at 1.24. Second troponin came back markedly elevated at 20 and continued to rise peaking at 84, before trending downward. Patient himself has remained chest pain-free. As lactic acid level resolved, patient stabilized and transferred to floor.  Patient continues to do well. Diuretic started. He is down several pounds. He has been reportedly more confused.  Assessment/Plan: Principal Problem:   Sepsis secondary UTI: Patient meets criteria given lactic acidosis, fever and tachycardia with urinary source. Suspect increase in white count may be more due to stress margination from non-ST elevated MI. Urine cultures show no growth yet No other source of infection noted.   Active Problems:   Essential hypertension: Holding antihypertensives given borderline low pressures. Now that stable, beta blocker started.    Acute kidney injury in the setting of CKD (chronic kidney disease), stage FAO:[id number] to improve, near baseline  Continue IV fluids.  Confusion: Suspect prolonged hospitalization bringing out mild dementia. Not severely agitated and would like to avoid sedating him    NSTEMI (non-ST elevated myocardial infarction): Likely started prior to admission. Secondary to sepsis. Being followed by cardiology. Have added nitroglycerin patch. Recommendation is to try to manage conservatively and treat sepsis.  Echocardiogram notes mildly decreased ejection fraction, diastolic dysfunction and stenosed  aortic valve. Metoprolol started. Cardiology restarting Imdur today. Outpatient nuclear stress test. Not a candidate for catheterization    Severe aortic stenosis/CAD/chronic diastolic heart failure: Unfortunately with aggressive fluid resuscitation likely leading to volume overload. Monitor for flash pulmonary edema. Another renal function normalized and he is post-sepsis, have started gentle diuresis.  Bright red blood per rectum: Patient initial presentation with sudden onset chills and rigors while using the commode. He had several loose bowel movements and blood. No evidence of blood loss anemia suspect that this may be more body response to hypotension from sepsis. No signs of any abdominal infection. Patient is not having any abdominal pain.  Code Status: Full code  Family Communication: Spoke with daughter by phone 8/30  Disposition Plan: Likely will need several days of diuresis, anticipate discharge Thursday 9/1   Consultants:  Cardiology  Procedures: Echocardiogram done 8/26: Sodium decreased ejection fraction of 45%, grade 1 diastolic dysfunction and severe aortic valve stenosis  Antibiotics:  IV vancomycin 8/251 dose  IV Zosyn 8/251 dose  IV Rocephin 8/25-present   Objective: BP 122/68 mmHg  Pulse 89  Temp(Src) 99 F (37.2 C) (Oral)  Resp 20  Ht 6' (1.829 m)  Wt 76.4 kg (168 lb 6.9 oz)  BMI 22.84 kg/m2  SpO2 100%  Intake/Output Summary (Last 24 hours) at 09/10/14 1749 Last data filed at 09/10/14 1603  Gross per 24 hour  Intake    150 ml  Output   1725 ml  Net  -1575 ml   Filed Weights   09/08/14 0500 09/09/14 0514 09/10/14 0505  Weight: 79.8 kg (175 lb 14.8 oz) 77.8 kg (171 lb 8.3 oz) 76.4 kg (168 lb 6.9 oz)    Exam:   General:  Alert and oriented 1, no acute distress  Cardiovascular: Regular rate and rhythm, 3/6 holosystolic murmur  Respiratory: Clear to auscultation bilaterally, no crackles  Abdomen: Soft, nontender, nondistended,  hypoactive bowel sounds  Musculoskeletal: No clubbing or cyanosis, trace pitting edema  Data Reviewed: Basic Metabolic Panel:  Recent Labs Lab 09/05/14 1347 09/06/14 0405 09/07/14 0408 09/08/14 0553 09/09/14 0423 09/10/14 0502  NA 142 142 139 140 141 140  K 4.4 4.9 4.6 4.3 3.9 3.5  CL 112* 119* 119* 120* 117* 114*  CO2 19* 16* 14* 14* 15* 16*  GLUCOSE 86 116* 108* 101* 96 94  BUN 43* 35* 29* 26* 25* 23*  CREATININE 2.65* 1.89* 1.43* 1.34* 1.38* 1.42*  CALCIUM 8.8* 8.0* 7.9* 7.9* 8.2* 8.4*  MG 1.8  --   --   --   --   --    Liver Function Tests:  Recent Labs Lab 09/05/14 1347 09/06/14 0405 09/10/14 0502  AST 23 121* 39  ALT 13* 30 27  ALKPHOS 91 70 65  BILITOT 1.4* 0.7 0.6  PROT 6.7 6.2* 5.7*  ALBUMIN 4.0 3.3* 3.2*    Recent Labs Lab 09/05/14 1347  LIPASE 24   No results for input(s): AMMONIA in the last 168 hours. CBC:  Recent Labs Lab 09/05/14 1347 09/06/14 0405 09/07/14 0408 09/08/14 0553  WBC 10.0 15.0* 10.5 7.6  NEUTROABS 9.0*  --   --   --   HGB 12.1* 11.8* 11.5* 10.7*  HCT 38.6* 36.6* 35.3* 32.1*  MCV 101.8* 102.2* 101.1* 100.0  PLT 173 189 161 159   Cardiac Enzymes:    Recent Labs Lab 09/06/14 0100 09/06/14 0717 09/06/14 1450 09/07/14 0408 09/08/14 0906  TROPONINI 20.51* 48.92* 84.75* 50.08* 25.07*   BNP (last 3 results) No results for input(s): BNP in the last 8760 hours.  ProBNP (last 3 results) No results for input(s): PROBNP in the last 8760 hours.  CBG: No results for input(s): GLUCAP in the last 168 hours.  Recent Results (from the past 240 hour(s))  Blood culture (routine x 2)     Status: None   Collection Time: 09/05/14  1:38 PM  Result Value Ref Range Status   Specimen Description BLOOD RIGHT FOREARM  Final   Special Requests BOTTLES DRAWN AEROBIC AND ANAEROBIC  Final   Culture   Final    NO GROWTH 5 DAYS Performed at [facility]    Report Status 09/10/2014 FINAL  Final  Blood culture (routine x 2)      Status: None   Collection Time: 09/05/14  1:46 PM  Result Value Ref Range Status   Specimen Description BLOOD RIGHT ANTECUBITAL  Final   Special Requests BOTTLES DRAWN AEROBIC AND ANAEROBIC  Final   Culture   Final    NO GROWTH 5 DAYS Performed at [facility]    Report Status 09/10/2014 FINAL  Final  Urine culture     Status: None   Collection Time: 09/05/14  2:42 PM  Result Value Ref Range Status   Specimen Description URINE, CATHETERIZED  Final   Special Requests NONE  Final   Culture   Final    NO GROWTH 1 DAY Performed at [facility]    Report Status 09/06/2014 FINAL  Final  Stool culture     Status: None   Collection Time: 09/05/14  4:12 PM  Result Value Ref Range Status   Specimen Description STOOL  Final   Special Requests Normal  Final   Culture   Final    NO  SALMONELLA, SHIGELLA, CAMPYLOBACTER, YERSINIA, OR E.COLI 0157:H7 ISOLATED Performed at [facility]    Report Status 09/09/2014 FINAL  Final  C difficile quick scan w PCR reflex     Status: None   Collection Time: 09/05/14  4:12 PM  Result Value Ref Range Status   C Diff antigen NEGATIVE NEGATIVE Final   C Diff toxin NEGATIVE NEGATIVE Final   C Diff interpretation Negative for toxigenic C. difficile  Final  MRSA PCR Screening     Status: None   Collection Time: 09/05/14  8:59 PM  Result Value Ref Range Status   MRSA by PCR NEGATIVE NEGATIVE Final    Comment:        The GeneXpert MRSA Assay (FDA approved for NASAL specimens only), is one component of a comprehensive MRSA colonization surveillance program. It is not intended to diagnose MRSA infection nor to guide or monitor treatment for MRSA infections.      Studies: No results found.  Scheduled Meds: . aspirin EC  81 mg Oral Daily  . cefTRIAXone (ROCEPHIN)  IV  1 g Intravenous Q24H  . enoxaparin (LOVENOX) injection  40 mg Subcutaneous Q24H  . ezetimibe-simvastatin  1 tablet Oral q1800  . [START ON 09/11/2014]  furosemide  40 mg Oral Daily  . isosorbide mononitrate  15 mg Oral Daily  . LORazepam  0.5 mg Oral QHS  . metoprolol succinate  12.5 mg Oral BID  . sodium chloride  3 mL Intravenous Q12H  . tamsulosin  0.4 mg Oral Daily    Continuous Infusions:     Time spent: 15 minutes  Hollice Espy  [facility] Pager [phone]. If 7PM-7AM, please contact night-coverage at [email or web address], password [email or web address] 09/10/2014, 5:49 PM  LOS: 5 days

## 2014-09-10 NOTE — Progress Notes (Signed)
Patient Name: [name] Date of Encounter: 09/10/2014  Primary Cardiologist: [name]   Principal Problem:   Sepsis Active Problems:   Essential hypertension   CKD (chronic kidney disease), stage III   NSTEMI (non-ST elevated myocardial infarction)   Severe aortic stenosis   PVD (peripheral vascular disease)   BRBPR (bright red blood per rectum)   UTI (lower urinary tract infection)   AKI (acute kidney injury)   Chronic combined systolic and diastolic congestive heart failure    SUBJECTIVE  Did not sleep well last night as he normally take PM medication at 7:30pm, however has been receiving medication at 10PM here. Denies any CP. No SOB.  CURRENT MEDS . aspirin EC  81 mg Oral Daily  . cefTRIAXone (ROCEPHIN)  IV  1 g Intravenous Q24H  . enoxaparin (LOVENOX) injection  40 mg Subcutaneous Q24H  . furosemide  20 mg Intravenous BID  . isosorbide mononitrate  15 mg Oral Daily  . LORazepam  0.5 mg Oral QHS  . metoprolol succinate  12.5 mg Oral BID  . sodium chloride  3 mL Intravenous Q12H  . tamsulosin  0.4 mg Oral Daily    OBJECTIVE  Filed Vitals:   09/09/14 2053 09/10/14 0505 09/10/14 0506 09/10/14 0508  BP: 127/71 129/73 133/72 131/77  Pulse: 92 90 87 99  Temp: 97.8 F (36.6 C) 98.4 F (36.9 C)    TempSrc: Oral Oral    Resp: 20 20    Height:      Weight:  168 lb 6.9 oz (76.4 kg)    SpO2: 100% 100%      Intake/Output Summary (Last 24 hours) at 09/10/14 0938 Last data filed at 09/10/14 0522  Gross per 24 hour  Intake    150 ml  Output   1525 ml  Net  -1375 ml   Filed Weights   09/08/14 0500 09/09/14 0514 09/10/14 0505  Weight: 175 lb 14.8 oz (79.8 kg) 171 lb 8.3 oz (77.8 kg) 168 lb 6.9 oz (76.4 kg)    PHYSICAL EXAM  General: Pleasant, NAD. Neuro: Alert and oriented X 3. Moves all extremities spontaneously. Psych: Normal affect. HEENT:  Normal  Neck: Supple without bruits or JVD. Lungs:  Resp regular and unlabored. CTA Heart: RRR no s3, s4. Loud  systolic murmur 4/6  Abdomen: Soft, non-tender, non-distended, BS + x 4.  Extremities: No clubbing, cyanosis or edema. DP/PT/Radials 2+ and equal bilaterally.  Accessory Clinical Findings  CBC  Recent Labs  09/08/14 0553  WBC 7.6  HGB 10.7*  HCT 32.1*  MCV 100.0  PLT 159   Basic Metabolic Panel  Recent Labs  09/09/14 0423 09/10/14 0502  NA 141 140  K 3.9 3.5  CL 117* 114*  CO2 15* 16*  GLUCOSE 96 94  BUN 25* 23*  CREATININE 1.38* 1.42*  CALCIUM 8.2* 8.4*   Liver Function Tests  Recent Labs  09/10/14 0502  AST 39  ALT 27  ALKPHOS 65  BILITOT 0.6  PROT 5.7*  ALBUMIN 3.2*   Cardiac Enzymes  Recent Labs  09/08/14 0906  TROPONINI 25.07*   Fasting Lipid Panel  Recent Labs  09/10/14 0502  CHOL 116  HDL 31*  LDLCALC 62  TRIG 161  CHOLHDL 3.7    TELE No new EKG    ECG  NSR with HR 80-90s, occasional missed beats  Echocardiogram 09/06/2014  LV EF: 40% -  45%  ------------------------------------------------------------------- Indications:   Aortic stenosis /insufficiency 424.1.  ------------------------------------------------------------------- History:  PMH: Elevated Troponin. Coronary artery disease. Aortic valve disease. Stroke.  ------------------------------------------------------------------- Study Conclusions  - Left ventricle: The cavity size was normal. There was mild focal basal hypertrophy of the septum. Systolic function was mildly to moderately reduced. The estimated ejection fraction was in the range of 40% to 45%. There is akinesis of the basalinferior myocardium. There is akinesis of the entireinferolateral myocardium. There was an increased relative contribution of atrial contraction to ventricular filling. Doppler parameters are consistent with abnormal left ventricular relaxation (grade 1 diastolic dysfunction). - Aortic valve: The AV appears at least moderately stenotic. By peak AV  velocity and mean gradient there appears to be moderate stenosis but by calculated AVA it appears severely stenotic. The mean AV gradient and peak velocity may be underestimated due to reduced LVF. Consider dobutamine stress echo for further assessment of degree of AS. Severely calcified annulus. Trileaflet. Severe diffuse thickening and calcification. Valve mobility was restricted. There was moderate stenosis. There was moderate regurgitation. Valve area (VTI): 0.7 cm^2. Valve area (Vmax): 0.72 cm^2. Valve area (Vmean): 0.69 cm^2. - Mitral valve: Mild calcification, with mild involvement of chords. There was mild regurgitation. - Left atrium: The atrium was moderately dilated. - Right ventricle: The cavity size was mildly dilated. Wall thickness was normal.    Radiology/Studies  Dg Chest 2 View  09/05/2014   CLINICAL DATA:  Diarrhea and vomiting today, difficulty urinating for 1.5 weeks, history UTIs, coronary artery disease post MI, stroke, pneumonia  EXAM: CHEST  2 VIEW  COMPARISON:  02/03/2013  FINDINGS: Enlargement of cardiac silhouette post CABG.  Slight pulmonary vascular congestion.  Chronic interstitial changes in mid to lower lungs with RIGHT basilar atelectasis and decreased lung volumes.  No definite infiltrate, pleural effusion or pneumothorax.  IMPRESSION: Enlargement of cardiac silhouette post CABG.  Chronic inches disease and RIGHT basilar atelectasis.   Electronically Signed   By: Ulyses Southward M.D.   On: 09/05/2014 15:07    ASSESSMENT AND PLAN  81 yo male w/ hx CABG 2004 (LIMA-dLAD, SVG-OM1-OM2, SVG-AM), echo 04/2014 w/ EF 50%, L subclavian 80%, cath 2005 w/ SVG-OM1-OM2 occluded, other grafts patent, LIMA-dLAD jeopardized by subclavian stenosis, CKD, CVA, BPH, BRBPR. Admitted 08/25 w/ Urosepsis, cards following for NSTEMI  1. NSTEMI  - Peak Troponin 84.75, was 25.07 08/28  - new WMA on echo w/ decreased EF  - continue ASA, BB. Imdur. LFT stable.  Restart vytorin. ?if need plavix for medical management  2. Severe aortic stenosis: longstanding  - 04/2014 Echo w/ Mean gradient (S): 45 mm Hg. Peak gradient (S): 75 mm Hg.  - gradients now 31 mm Hg and 49 mm Hg. less but EF is lower  3. Acute on chronic combined systolic and diastolic HF  - euvolemic on exam today, transition to PO lasix today  4. Urosepsis: per IM   Signed, Azalee Course PA-C Pager: [phone] Agree with above assessment. Patient denies any chest pain or dyspnea. Rhythm NSR. BP stable. Statin being restarted. In view of recent bright red blood per rectum will not add plavix at this time.  Continue ASA 81 mg.

## 2014-09-10 NOTE — Progress Notes (Signed)
Occupational Therapy Treatment Patient Details Name: [name] MRN: [id number] DOB: 08-25-1924 Today's Date: 09/10/2014    History of present illness Patient is an 81 year old male past history of CAD status post CABG, stage III chronic kidney disease and severe aortic stenosis admitted on 8/25 with septic shock secondary to UTI   OT comments  Pt states daughter cant provide 24/7 A. Explained importance of fall prevention  Follow Up Recommendations  SNF;Supervision - Intermittent;Supervision/Assistance - 24 hour;Home health OT;Other (comment)          Precautions / Restrictions Precautions Precautions: Fall       Mobility Bed Mobility   Bed Mobility: Supine to Sit     Supine to sit: Mod assist        Transfers Overall transfer level: Needs assistance Equipment used: Rolling walker (2 wheeled) Transfers: Sit to/from Stand Sit to Stand: Min assist Stand pivot transfers: Min assist       General transfer comment: VC for safety and hand placement. Assist to rise, stabilize, control descent        ADL Overall ADL's : Needs assistance/impaired                 Upper Body Dressing : Minimal assistance;Sitting   Lower Body Dressing: Sit to/from stand;Moderate assistance   Toilet Transfer: Minimal Production designer, theatre/television/film Details (indicate cue type and reason): VC for hand placement as well as not to pull on walker Toileting- Clothing Manipulation and Hygiene: Minimal assistance;Sit to/from stand         General ADL Comments: OT feels pt needs 24/7 A at least initially                Cognition   Behavior During Therapy: [facility] for tasks assessed/performed Overall Cognitive Status: Within Functional Limits for tasks assessed                               General Comments      Pertinent Vitals/ Pain       Pain Score: 5  Pain Location: L shoulder Pain Descriptors / Indicators: Sore Pain Intervention(s): Monitored during  session            Progress Toward Goals  OT Goals(current goals can now be found in the care plan section)  Progress towards OT goals: Progressing toward goals     Plan         End of Session  in chair with chair alarm and call bell   Activity Tolerance Patient limited by fatigue   Patient Left in chair   Nurse Communication Mobility status        Time: 4098-1191 OT Time Calculation (min): 30 min  Charges: OT General Charges $OT Visit: 1 Procedure OT Treatments $Self Care/Home Management : 23-37 mins  REDDING, [place] Kung 09/10/2014, 11:46 AM

## 2014-09-11 LAB — BASIC METABOLIC PANEL
ANION GAP: 11 (ref 5–15)
BUN: 22 mg/dL — AB (ref 6–20)
CALCIUM: 8.2 mg/dL — AB (ref 8.9–10.3)
CO2: 17 mmol/L — ABNORMAL LOW (ref 22–32)
CREATININE: 1.4 mg/dL — AB (ref 0.61–1.24)
Chloride: 109 mmol/L (ref 101–111)
GFR calc Af Amer: 50 mL/min — ABNORMAL LOW (ref >60)
GFR, EST NON AFRICAN AMERICAN: 43 mL/min — AB (ref >60)
GLUCOSE: 113 mg/dL — AB (ref 65–99)
Potassium: 3.5 mmol/L (ref 3.5–5.1)
Sodium: 137 mmol/L (ref 135–145)

## 2014-09-11 LAB — CBC
HCT: 36.5 % — ABNORMAL LOW (ref 39.0–52.0)
HEMOGLOBIN: 11.9 g/dL — AB (ref 13.0–17.0)
MCH: 32.1 pg (ref 26.0–34.0)
MCHC: 32.6 g/dL (ref 30.0–36.0)
MCV: 98.4 fL (ref 78.0–100.0)
PLATELETS: 267 10*3/uL (ref 150–400)
RBC: 3.71 MIL/uL — ABNORMAL LOW (ref 4.22–5.81)
RDW: 14.5 % (ref 11.5–15.5)
WBC: 14.9 10*3/uL — ABNORMAL HIGH (ref 4.0–10.5)

## 2014-09-11 MED ORDER — SODIUM BICARBONATE 650 MG PO TABS
650.0000 mg | ORAL_TABLET | Freq: Two times a day (BID) | ORAL | Status: DC
  Administered 2014-09-11 – 2014-09-12 (×3): 650 mg via ORAL
  Filled 2014-09-11 (×4): qty 1

## 2014-09-11 MED ORDER — MORPHINE SULFATE (PF) 2 MG/ML IV SOLN
2.0000 mg | Freq: Once | INTRAVENOUS | Status: AC
  Administered 2014-09-11: 2 mg via INTRAVENOUS
  Filled 2014-09-11: qty 1

## 2014-09-11 MED ORDER — CYCLOBENZAPRINE HCL 10 MG PO TABS
10.0000 mg | ORAL_TABLET | Freq: Once | ORAL | Status: AC
  Administered 2014-09-11: 10 mg via ORAL
  Filled 2014-09-11: qty 1

## 2014-09-11 NOTE — Progress Notes (Signed)
Pt restless throught out he night....NP called x2 for IV pain control. Pt spit oral meds at nurse x 2 episode. Will cont to monitor. Condom cath in place. SRP, RN

## 2014-09-11 NOTE — Progress Notes (Signed)
Pt continues to moan and groan in pain. States his neck shoulder, elbow hurt constantly. MD called, SRP, RN

## 2014-09-11 NOTE — Progress Notes (Signed)
Flexiril given for shoulder neck and elbow discomfort, med effective, pt resting quietly without distress. SRP, RN

## 2014-09-11 NOTE — Progress Notes (Signed)
Pt today has demonstrated periods today of delayed responses to some questions and Pt has also demonstrated some periods of rambling speech that does not pertain to current conversation. Example: Pt is is correct in month August and "It is August" then Pt stopped talking and started making noises like a cat meowing. When questioned Pt Pt states "It's her not him that does that"

## 2014-09-11 NOTE — Progress Notes (Addendum)
Physical Therapy Treatment Patient Details Name: [name] MRN: [id number] DOB: 01-14-1924 Today's Date: 09/11/2014    History of Present Illness Patient is an 81 year old male past history of CAD status post CABG, stage III chronic kidney disease and severe aortic stenosis admitted on 8/25 with septic shock secondary to UTI    SATURATION QUALIFICATIONS: (This note is used to comply with regulatory documentation for home oxygen)  Patient Saturations on Room Air at Rest = 94%  Patient Saturations on Room Air while Ambulating = 85%  Patient Saturations on 2 Liters of oxygen while Ambulating = 92%  Please briefly explain why patient needs home oxygen: required supp oxygen to achieve therapeutic level    PT Comments    Pt demonstrated increased confusion and redirection.  Easily distracted with rambling stories.  Required repeat cueing to stay on task.  Assisted OOB to amb required + 2 assist for safety.  HIGH FALL RISK.  Very unsteady gait.  Follow Up Recommendations  Home health PT;Supervision/Assistance - 24 hour  (pt will need 24/7 care at home.  HIGH FALL RISK.  Requires assist with all mobility and ADL's. )     Equipment Recommendations       Recommendations for Other Services       Precautions / Restrictions Precautions Precautions: Fall Restrictions Weight Bearing Restrictions: No    Mobility  Bed Mobility Overal bed mobility: Needs Assistance Bed Mobility: Supine to Sit     Supine to sit: Max assist     General bed mobility comments: Pt required increased assist to self rise and sit on EOB.  Noted decreased use of L UE "that's my bad shoulder".  Required increased time and use of bed pad to complete scooting to EOB.   Transfers Overall transfer level: Needs assistance Equipment used: Rolling walker (2 wheeled) Transfers: Sit to/from Stand Sit to Stand: +2 safety/equipment;Min assist;Mod assist         General transfer comment: pt required 75% repeat  functional cueing to complete task at hand.  Easily distracted and continues with a random rambling conversation.  Pleasant but not focused.  Ambulation/Gait Ambulation/Gait assistance: Mod assist;Min assist;+2 safety/equipment Ambulation Distance (Feet): 18 Feet Assistive device: Rolling walker (2 wheeled) Gait Pattern/deviations: Step-to pattern;Step-through pattern;Decreased step length - right;Decreased step length - left;Decreased stance time - right;Trunk flexed;Shuffle;Narrow base of support     General Gait Details: Pt required + 2 assist for amb safety and assist to maneuver walker correcltly esp through doorway and around obsticles.  Poor balance with Mod posterior lean and delayed corrective reaction.  HIGH FALL RISK.  Shuffled gait.  Recliner following for safety.  Also noted RA decreased to 85% with noted 2/4 dyspnea with increased RR.  Decreased amb distance tolerated.    Stairs            Wheelchair Mobility    Modified Rankin (Stroke Patients Only)       Balance                                    Cognition Arousal/Alertness: Awake/alert Behavior During Therapy:  (intermittent confused and rambling conversation)                        Exercises      General Comments        Pertinent Vitals/Pain Pain Assessment: No/denies pain    Home Living  Prior Function            PT Goals (current goals can now be found in the care plan section) Progress towards PT goals: Progressing toward goals    Frequency  Min 3X/week    PT Plan      Co-evaluation             End of Session Equipment Utilized During Treatment: Gait belt Activity Tolerance: Patient limited by fatigue Patient left: in chair;with call bell/phone within reach;with chair alarm set     Time: 1610-9604 PT Time Calculation (min) (ACUTE ONLY): 17 min  Charges:  $Gait Training: 8-22 mins                    G Codes:       Felecia Shelling  PTA [facility]  Acute  Rehab Pager      [phone]

## 2014-09-11 NOTE — Progress Notes (Addendum)
PROGRESS NOTE  [name] ZOX:[id number] DOB: 08/26/1924 DOA: 09/05/2014 PCP: Tana Conch, MD  HPI/Recap of past 24 hours: Patient is an 80 year old male past mental history of CAD status post CABG, stage III chronic kidney disease and severe aortic stenosis admitted on 8/25 with septic shock secondary to UTI. Initial lab work significant for acute kidney injury with creatinine at 2.65 felt to be secondary to hypovolemia. Patient started on IV antibiotics and IV fluids. Initial troponin elevated at 1.24. Second troponin came back markedly elevated at 20 and continued to rise peaking at 84, before trending downward. Patient himself has remained chest pain-free. As lactic acid level resolved, patient stabilized and transferred to floor.  He is feeling well, denies chest pain.   Assessment/Plan:  Sepsis secondary UTI: Patient meets criteria given lactic acidosis, fever and tachycardia with urinary source. Suspect increase in white count may be more due to stress margination from non-ST elevated MI. Urine cultures show no growth yet. No other source of infection noted.  C diff negative,.  Follow WBC trend. Continue with ceftriaxone.   Essential hypertension: continue with metoprolol   Acute kidney injury in the setting of CKD (chronic kidney disease), stage WJX:BJYNWGNFA to improve, near baseline   Monitor on oral lasix. Start sodium bicarb tablet.   Confusion: Suspect prolonged hospitalization bringing out mild dementia. Not severely agitated and would like to avoid sedating him   NSTEMI (non-ST elevated myocardial infarction): Likely started prior to admission. Secondary to sepsis. Being followed by cardiology.  Recommendation is to try to manage conservatively and treat sepsis.  Echocardiogram notes mildly decreased ejection fraction, diastolic dysfunction and stenosed aortic valve. Metoprolol started. Cardiology restarting Imdur .Outpatient nuclear stress test. Not a candidate for  catheterization  Severe aortic stenosis/CAD/chronic diastolic heart failure: on lasix. Cardiology following.   Bright red blood per rectum: Patient initial presentation with sudden onset chills and rigors while using the commode. He had several loose bowel movements and blood. No evidence of blood loss anemia suspect that this may be more body response to hypotension from sepsis. No signs of any abdominal infection. Patient is not having any abdominal pain.  Repeat CBC today.    Code Status: Full code  Family Communication: Spoke with daughter by phone 8/30  Disposition Plan: Likely will need several days of diuresis, anticipate discharge Thursday 9/1 might need placement/   Consultants:  Cardiology  Procedures: Echocardiogram done 8/26: Sodium decreased ejection fraction of 45%, grade 1 diastolic dysfunction and severe aortic valve stenosis  Antibiotics:  IV vancomycin 8/251 dose  IV Zosyn 8/251 dose  IV Rocephin 8/25-present   Objective: BP 118/74 mmHg  Pulse 63  Temp(Src) 97.7 F (36.5 C) (Oral)  Resp 20  Ht 6' (1.829 m)  Wt 75.7 kg (166 lb 14.2 oz)  BMI 22.63 kg/m2  SpO2 99%  Intake/Output Summary (Last 24 hours) at 09/11/14 1729 Last data filed at 09/11/14 0443  Gross per 24 hour  Intake      0 ml  Output    425 ml  Net   -425 ml   Filed Weights   09/09/14 0514 09/10/14 0505 09/11/14 0443  Weight: 77.8 kg (171 lb 8.3 oz) 76.4 kg (168 lb 6.9 oz) 75.7 kg (166 lb 14.2 oz)    Exam:   General:  Alert and oriented 1, no acute distress  Cardiovascular: Regular rate and rhythm, 3/6 holosystolic murmur  Respiratory: Clear to auscultation bilaterally, no crackles  Abdomen: Soft, nontender, nondistended, hypoactive bowel sounds  Musculoskeletal: No clubbing or cyanosis, trace pitting edema  Data Reviewed: Basic Metabolic Panel:  Recent Labs Lab 09/05/14 1347  09/07/14 0408 09/08/14 0553 09/09/14 0423 09/10/14 0502 09/11/14 0510  NA 142  < >  139 140 141 140 137  K 4.4  < > 4.6 4.3 3.9 3.5 3.5  CL 112*  < > 119* 120* 117* 114* 109  CO2 19*  < > 14* 14* 15* 16* 17*  GLUCOSE 86  < > 108* 101* 96 94 113*  BUN 43*  < > 29* 26* 25* 23* 22*  CREATININE 2.65*  < > 1.43* 1.34* 1.38* 1.42* 1.40*  CALCIUM 8.8*  < > 7.9* 7.9* 8.2* 8.4* 8.2*  MG 1.8  --   --   --   --   --   --   < > = values in this interval not displayed. Liver Function Tests:  Recent Labs Lab 09/05/14 1347 09/06/14 0405 09/10/14 0502  AST 23 121* 39  ALT 13* 30 27  ALKPHOS 91 70 65  BILITOT 1.4* 0.7 0.6  PROT 6.7 6.2* 5.7*  ALBUMIN 4.0 3.3* 3.2*    Recent Labs Lab 09/05/14 1347  LIPASE 24   No results for input(s): AMMONIA in the last 168 hours. CBC:  Recent Labs Lab 09/05/14 1347 09/06/14 0405 09/07/14 0408 09/08/14 0553 09/11/14 1705  WBC 10.0 15.0* 10.5 7.6 14.9*  NEUTROABS 9.0*  --   --   --   --   HGB 12.1* 11.8* 11.5* 10.7* 11.9*  HCT 38.6* 36.6* 35.3* 32.1* 36.5*  MCV 101.8* 102.2* 101.1* 100.0 98.4  PLT 173 189 161 159 267   Cardiac Enzymes:    Recent Labs Lab 09/06/14 0100 09/06/14 0717 09/06/14 1450 09/07/14 0408 09/08/14 0906  TROPONINI 20.51* 48.92* 84.75* 50.08* 25.07*   BNP (last 3 results) No results for input(s): BNP in the last 8760 hours.  ProBNP (last 3 results) No results for input(s): PROBNP in the last 8760 hours.  CBG: No results for input(s): GLUCAP in the last 168 hours.  Recent Results (from the past 240 hour(s))  Blood culture (routine x 2)     Status: None   Collection Time: 09/05/14  1:38 PM  Result Value Ref Range Status   Specimen Description BLOOD RIGHT FOREARM  Final   Special Requests BOTTLES DRAWN AEROBIC AND ANAEROBIC  Final   Culture   Final    NO GROWTH 5 DAYS Performed at [facility]    Report Status 09/10/2014 FINAL  Final  Blood culture (routine x 2)     Status: None   Collection Time: 09/05/14  1:46 PM  Result Value Ref Range Status   Specimen Description BLOOD  RIGHT ANTECUBITAL  Final   Special Requests BOTTLES DRAWN AEROBIC AND ANAEROBIC  Final   Culture   Final    NO GROWTH 5 DAYS Performed at [facility]    Report Status 09/10/2014 FINAL  Final  Urine culture     Status: None   Collection Time: 09/05/14  2:42 PM  Result Value Ref Range Status   Specimen Description URINE, CATHETERIZED  Final   Special Requests NONE  Final   Culture   Final    NO GROWTH 1 DAY Performed at [facility]    Report Status 09/06/2014 FINAL  Final  Stool culture     Status: None   Collection Time: 09/05/14  4:12 PM  Result Value Ref Range Status   Specimen  Description STOOL  Final   Special Requests Normal  Final   Culture   Final    NO SALMONELLA, SHIGELLA, CAMPYLOBACTER, YERSINIA, OR E.COLI 0157:[id number] ISOLATED Performed at [facility]    Report Status 09/09/2014 FINAL  Final  C difficile quick scan w PCR reflex     Status: None   Collection Time: 09/05/14  4:12 PM  Result Value Ref Range Status   C Diff antigen NEGATIVE NEGATIVE Final   C Diff toxin NEGATIVE NEGATIVE Final   C Diff interpretation Negative for toxigenic C. difficile  Final  MRSA PCR Screening     Status: None   Collection Time: 09/05/14  8:59 PM  Result Value Ref Range Status   MRSA by PCR NEGATIVE NEGATIVE Final    Comment:        The GeneXpert MRSA Assay (FDA approved for NASAL specimens only), is one component of a comprehensive MRSA colonization surveillance program. It is not intended to diagnose MRSA infection nor to guide or monitor treatment for MRSA infections.      Studies: No results found.  Scheduled Meds: . aspirin EC  81 mg Oral Daily  . cefTRIAXone (ROCEPHIN)  IV  1 g Intravenous Q24H  . enoxaparin (LOVENOX) injection  40 mg Subcutaneous Q24H  . ezetimibe-simvastatin  1 tablet Oral q1800  . furosemide  40 mg Oral Daily  . isosorbide mononitrate  15 mg Oral Daily  . LORazepam  0.5 mg Oral QHS  . metoprolol succinate  12.5 mg  Oral BID  . sodium bicarbonate  650 mg Oral BID  . sodium chloride  3 mL Intravenous Q12H  . tamsulosin  0.4 mg Oral Daily    Continuous Infusions:     Time spent: 15 minutes  Marquiz Sotelo A  [facility] Pager [phone]. If 7PM-7AM, please contact night-coverage at [email or web address], password [facility] 09/11/2014, 5:29 PM  LOS: 6 days

## 2014-09-11 NOTE — Care Management Important Message (Signed)
Important Message  Patient Details  Name: WALI REINHEIMER MRN: [id number] Date of Birth: [birth year]   Medicare Important Message Given:  Yes-third notification given    Haskell Flirt 09/11/2014, 12:54 PMImportant Message  Patient Details  Name: SLAYDEN MENNENGA MRN: [id number] Date of Birth: 04/11/1924   Medicare Important Message Given:  Yes-third notification given    Haskell Flirt 09/11/2014, 12:54 PM

## 2014-09-11 NOTE — Progress Notes (Signed)
Patient Name: [name] Date of Encounter: 09/11/2014  Primary Cardiologist: [name]   Principal Problem:   Sepsis Active Problems:   Essential hypertension   CKD (chronic kidney disease), stage III   NSTEMI (non-ST elevated myocardial infarction)   Severe aortic stenosis   PVD (peripheral vascular disease)   BRBPR (bright red blood per rectum)   UTI (lower urinary tract infection)   AKI (acute kidney injury)   Chronic combined systolic and diastolic congestive heart failure    SUBJECTIVE  Daughter at bedside, did not sleep very well. Denies any CP or SOB.   CURRENT MEDS . aspirin EC  81 mg Oral Daily  . cefTRIAXone (ROCEPHIN)  IV  1 g Intravenous Q24H  . enoxaparin (LOVENOX) injection  40 mg Subcutaneous Q24H  . ezetimibe-simvastatin  1 tablet Oral q1800  . furosemide  40 mg Oral Daily  . isosorbide mononitrate  15 mg Oral Daily  . LORazepam  0.5 mg Oral QHS  . metoprolol succinate  12.5 mg Oral BID  . sodium chloride  3 mL Intravenous Q12H  . tamsulosin  0.4 mg Oral Daily    OBJECTIVE  Filed Vitals:   09/10/14 1700 09/10/14 2147 09/11/14 0443 09/11/14 0917  BP: 122/68 109/65  105/60  Pulse: 89 75  70  Temp: 99 F (37.2 C) 97.9 F (36.6 C)    TempSrc: Oral Oral    Resp: 20 20    Height:      Weight:   166 lb 14.2 oz (75.7 kg)   SpO2: 100% 99%      Intake/Output Summary (Last 24 hours) at 09/11/14 1116 Last data filed at 09/11/14 0443  Gross per 24 hour  Intake      0 ml  Output   1425 ml  Net  -1425 ml   Filed Weights   09/09/14 0514 09/10/14 0505 09/11/14 0443  Weight: 171 lb 8.3 oz (77.8 kg) 168 lb 6.9 oz (76.4 kg) 166 lb 14.2 oz (75.7 kg)    PHYSICAL EXAM  General: Pleasant, NAD. Neuro: Alert and oriented X 3. Moves all extremities spontaneously. Psych: Normal affect. HEENT:  Normal  Neck: Supple without bruits or JVD. Lungs:  Resp regular and unlabored. CTA Heart: RRR no s3, s4. Loud systolic murmur 4/6  Abdomen: Soft, non-tender,  non-distended, BS + x 4.  Extremities: No clubbing, cyanosis or edema. DP/PT/Radials 2+ and equal bilaterally.  Accessory Clinical Findings  Basic Metabolic Panel  Recent Labs  09/10/14 0502 09/11/14 0510  NA 140 137  K 3.5 3.5  CL 114* 109  CO2 16* 17*  GLUCOSE 94 113*  BUN 23* 22*  CREATININE 1.42* 1.40*  CALCIUM 8.4* 8.2*   Liver Function Tests  Recent Labs  09/10/14 0502  AST 39  ALT 27  ALKPHOS 65  BILITOT 0.6  PROT 5.7*  ALBUMIN 3.2*   Fasting Lipid Panel  Recent Labs  09/10/14 0502  CHOL 116  HDL 31*  LDLCALC 62  TRIG 409  CHOLHDL 3.7    TELE No new EKG    ECG  NSR with HR 80-90s, occasional missed beats  Echocardiogram 09/06/2014  LV EF: 40% -  45%  ------------------------------------------------------------------- Indications:   Aortic stenosis /insufficiency 424.1.  ------------------------------------------------------------------- History:  PMH: Elevated Troponin. Coronary artery disease. Aortic valve disease. Stroke.  ------------------------------------------------------------------- Study Conclusions  - Left ventricle: The cavity size was normal. There was mild focal basal hypertrophy of the septum. Systolic function was mildly to moderately reduced. The  estimated ejection fraction was in the range of 40% to 45%. There is akinesis of the basalinferior myocardium. There is akinesis of the entireinferolateral myocardium. There was an increased relative contribution of atrial contraction to ventricular filling. Doppler parameters are consistent with abnormal left ventricular relaxation (grade 1 diastolic dysfunction). - Aortic valve: The AV appears at least moderately stenotic. By peak AV velocity and mean gradient there appears to be moderate stenosis but by calculated AVA it appears severely stenotic. The mean AV gradient and peak velocity may be underestimated due to reduced LVF. Consider  dobutamine stress echo for further assessment of degree of AS. Severely calcified annulus. Trileaflet. Severe diffuse thickening and calcification. Valve mobility was restricted. There was moderate stenosis. There was moderate regurgitation. Valve area (VTI): 0.7 cm^2. Valve area (Vmax): 0.72 cm^2. Valve area (Vmean): 0.69 cm^2. - Mitral valve: Mild calcification, with mild involvement of chords. There was mild regurgitation. - Left atrium: The atrium was moderately dilated. - Right ventricle: The cavity size was mildly dilated. Wall thickness was normal.    Radiology/Studies  Dg Chest 2 View  09/05/2014   CLINICAL DATA:  Diarrhea and vomiting today, difficulty urinating for 1.5 weeks, history UTIs, coronary artery disease post MI, stroke, pneumonia  EXAM: CHEST  2 VIEW  COMPARISON:  02/03/2013  FINDINGS: Enlargement of cardiac silhouette post CABG.  Slight pulmonary vascular congestion.  Chronic interstitial changes in mid to lower lungs with RIGHT basilar atelectasis and decreased lung volumes.  No definite infiltrate, pleural effusion or pneumothorax.  IMPRESSION: Enlargement of cardiac silhouette post CABG.  Chronic inches disease and RIGHT basilar atelectasis.   Electronically Signed   By: Ulyses Southward M.D.   On: 09/05/2014 15:07    ASSESSMENT AND PLAN  81 yo male w/ hx CABG 2004 (LIMA-dLAD, SVG-OM1-OM2, SVG-AM), echo 04/2014 w/ EF 50%, L subclavian 80%, cath 2005 w/ SVG-OM1-OM2 occluded, other grafts patent, LIMA-dLAD jeopardized by subclavian stenosis, CKD, CVA, BPH, BRBPR. Admitted 08/25 w/ Urosepsis, cards following for NSTEMI  1. NSTEMI  - Peak Troponin 84.75, was 25.07 08/28  - new WMA on echo w/ decreased EF  - continue ASA, BB. Imdur. LFT stable. vytorin restarted. Not on plavix given recent BRBPR  - stable from cardiac perspective, I am more worried about his ability to take care of himself after significant deconditioning. His daughter states he does not want to  go to SNF. He has barely ambulated here since arrival. He used to live alone in his house taking care of himself. I do not see him be able to do that anytime soon.  2. Severe aortic stenosis: longstanding  - 04/2014 Echo w/ Mean gradient (S): 45 mm Hg. Peak gradient (S): 75 mm Hg.  - gradients now 31 mm Hg and 49 mm Hg. less but EF is lower  3. Acute on chronic combined systolic and diastolic HF  - euvolemic on exam today, stable on PO lasix  4. Urosepsis: per IM  5. Significant deconditioning  Signed, Azalee Course PA-C Pager: [phone] Agree with above assessment. From cardiac standpoint he appears to be stable. Lungs are clear. No respiratory distress. Heart murmur unchanged. No edema. He is very deconditioned and will need to work hard with PT.

## 2014-09-12 LAB — CBC
HCT: 35.4 % — ABNORMAL LOW (ref 39.0–52.0)
Hemoglobin: 11.6 g/dL — ABNORMAL LOW (ref 13.0–17.0)
MCH: 32.4 pg (ref 26.0–34.0)
MCHC: 32.8 g/dL (ref 30.0–36.0)
MCV: 98.9 fL (ref 78.0–100.0)
PLATELETS: 227 10*3/uL (ref 150–400)
RBC: 3.58 MIL/uL — ABNORMAL LOW (ref 4.22–5.81)
RDW: 14.6 % (ref 11.5–15.5)
WBC: 11.4 10*3/uL — ABNORMAL HIGH (ref 4.0–10.5)

## 2014-09-12 LAB — BASIC METABOLIC PANEL
Anion gap: 10 (ref 5–15)
BUN: 27 mg/dL — AB (ref 6–20)
CALCIUM: 8.1 mg/dL — AB (ref 8.9–10.3)
CHLORIDE: 109 mmol/L (ref 101–111)
CO2: 19 mmol/L — ABNORMAL LOW (ref 22–32)
CREATININE: 1.53 mg/dL — AB (ref 0.61–1.24)
GFR, EST AFRICAN AMERICAN: 45 mL/min — AB (ref >60)
GFR, EST NON AFRICAN AMERICAN: 39 mL/min — AB (ref >60)
Glucose, Bld: 103 mg/dL — ABNORMAL HIGH (ref 65–99)
Potassium: 3.6 mmol/L (ref 3.5–5.1)
SODIUM: 138 mmol/L (ref 135–145)

## 2014-09-12 MED ORDER — CYCLOBENZAPRINE HCL 10 MG PO TABS: 10.0000 mg | ORAL_TABLET | Freq: Three times a day (TID) | ORAL | Status: AC | PRN

## 2014-09-12 MED ORDER — CYCLOBENZAPRINE HCL 10 MG PO TABS: 10.0000 mg | ORAL_TABLET | Freq: Three times a day (TID) | ORAL | Status: DC | PRN

## 2014-09-12 MED ORDER — METOPROLOL SUCCINATE ER 25 MG PO TB24: 12.5000 mg | ORAL_TABLET | Freq: Two times a day (BID) | ORAL | Status: AC

## 2014-09-12 MED ORDER — CEPHALEXIN 500 MG PO CAPS: 500.0000 mg | ORAL_CAPSULE | Freq: Two times a day (BID) | ORAL | Status: AC

## 2014-09-12 MED ORDER — ISOSORBIDE MONONITRATE ER 30 MG PO TB24: 15.0000 mg | ORAL_TABLET | Freq: Every day | ORAL | Status: AC

## 2014-09-12 MED ORDER — SODIUM BICARBONATE 650 MG PO TABS: 650.0000 mg | ORAL_TABLET | Freq: Two times a day (BID) | ORAL | Status: AC

## 2014-09-12 NOTE — Discharge Summary (Signed)
Physician Discharge Summary  [name] FEX:[id number] DOB: [birth year] DOA: 09/05/2014  PCP: Garret Reddish, MD  Admit date: 09/05/2014 Discharge date: 09/12/2014  Time spent: 35 minutes  Recommendations for Outpatient Follow-up:  Needs B-met to follows renal function.   Discharge Diagnoses:    Sepsis   Essential hypertension   UTI   CKD (chronic kidney disease), stage III   NSTEMI (non-ST elevated myocardial infarction)   Severe aortic stenosis   PVD (peripheral vascular disease)   BRBPR (bright red blood per rectum)   UTI (lower urinary tract infection)   AKI (acute kidney injury)   Chronic combined systolic and diastolic congestive heart failure   Discharge Condition: stable,   Diet recommendation: heart Healthy  [facility] Weights   09/10/14 0505 09/11/14 0443 09/12/14 0615  Weight: 76.4 kg (168 lb 6.9 oz) 75.7 kg (166 lb 14.2 oz) 78 kg (171 lb 15.3 oz)    History of present illness:  [name] is a 81 y.o. male  With past medical history of CAD status post CABG, diastolic heart failure and severe aortic stenosis who was in his usual health when he woke up this morning, but then several hours later when he went to use the bathroom and while sitting on the commode started feeling extremely lightheaded, and started having chills and rigors. Patient then had several loose bowel movements which were reported bloody. He was brought into the emergency room where he was found to have a very large urinary tract infection, a lactic acid level of 3.7 and a creatinine of 2.65 (was 2.07 in March). Patient started on sepsis protocol the emergency room. Following blood cultures, given broad-spectrum antibiotics as well as aggressive volume resuscitation. Hospitalist called for further evaluation and admission. Upon further discussion, patient did recall having several days of burning sensation when urinating   Hospital Course:  HPI/Recap of past 24 hours: Patient is an 81 year old male  past mental history of CAD status post CABG, stage III chronic kidney disease and severe aortic stenosis admitted on 8/25 with septic shock secondary to UTI. Initial lab work significant for acute kidney injury with creatinine at 2.65 felt to be secondary to hypovolemia. Patient started on IV antibiotics and IV fluids. Initial troponin elevated at 1.24. Second troponin came back markedly elevated at 20 and continued to rise peaking at 84, before trending downward. Patient himself has remained chest pain-free. As lactic acid level resolved, patient stabilized and transferred to floor.  He is feeling well, denies chest pain.   Assessment/Plan:  Sepsis secondary UTI: Patient meets criteria given lactic acidosis, fever and tachycardia with urinary source. Suspect increase in white count may be more due to stress margination from non-ST elevated MI. Urine cultures show no growth yet. No other source of infection noted.  C diff negative,.  Follow WBC trend. received 3 days of  ceftriaxone. discharge on Keflex for 2 more  days.   Essential hypertension: continue with metoprolol   Acute kidney injury in the setting of CKD (chronic kidney disease), stage FMB:[id number] to improve, near baseline  Monitor on oral lasix. Start sodium bicarb tablet. Acidosis improving.   Confusion: Suspect prolonged hospitalization bringing out mild dementia. Not severely agitated and would like to avoid sedating him  NSTEMI (non-ST elevated myocardial infarction): Likely started prior to admission. Secondary to sepsis. Being followed by cardiology. Recommendation is to try to manage conservatively and treat sepsis. Echocardiogram notes mildly decreased ejection fraction, diastolic dysfunction and stenosed aortic valve. Metoprolol started. Cardiology restarting  Imdur .Outpatient nuclear stress test. Not a candidate for catheterization  Severe aortic stenosis/CAD/chronic diastolic heart failure: on lasix. Cardiology  following.   Bright red blood per rectum: Patient initial presentation with sudden onset chills and rigors while using the commode. He had several loose bowel movements and blood. No evidence of blood loss anemia suspect that this may be more body response to hypotension from sepsis. No signs of any abdominal infection. Patient is not having any abdominal pain.  Repeat CBC today.   Procedures:  none  Consultations: Cardiology  Discharge Exam: Filed Vitals:   09/12/14 1043  BP: 94/56  Pulse:   Temp:   Resp:     General: Alert in no distress Cardiovascular: S 1, S 2 RRR Respiratory: CTA  Discharge Instructions   Discharge Instructions    Diet - low sodium heart healthy    Complete by:  As directed      Increase activity slowly    Complete by:  As directed           Current Discharge Medication List    START taking these medications   Details  cyclobenzaprine (FLEXERIL) 10 MG tablet Take 1 tablet (10 mg total) by mouth 3 (three) times daily as needed for muscle spasms (generalized muscle/joint pain). Qty: 30 tablet, Refills: 0    metoprolol succinate (TOPROL-XL) 25 MG 24 hr tablet Take 0.5 tablets (12.5 mg total) by mouth 2 (two) times daily. Qty: 60 tablet, Refills: 0    sodium bicarbonate 650 MG tablet Take 1 tablet (650 mg total) by mouth 2 (two) times daily. Qty: 30 tablet, Refills: 0      CONTINUE these medications which have CHANGED   Details  isosorbide mononitrate (IMDUR) 30 MG 24 hr tablet Take 0.5 tablets (15 mg total) by mouth daily. Qty: 30 tablet, Refills: 0      CONTINUE these medications which have NOT CHANGED   Details  allopurinol (ZYLOPRIM) 300 MG tablet TAKE 1 TABLET DAILY Qty: 90 tablet, Refills: 2    aspirin EC 81 MG EC tablet Take 1 tablet (81 mg total) by mouth daily. Qty: 30 tablet, Refills: 0    finasteride (PROSCAR) 5 MG tablet Take 5 mg by mouth daily.    furosemide (LASIX) 40 MG tablet TAKE 1 TABLET DAILY Qty: 90 tablet,  Refills: 2    LORazepam (ATIVAN) 0.5 MG tablet TAKE ONE TABLET BY MOUTH NIGHTLY AT BEDTIME AS NEEDED Qty: 30 tablet, Refills: 2    nitroGLYCERIN (NITROSTAT) 0.4 MG SL tablet Place 1 tablet (0.4 mg total) under the tongue every 5 (five) minutes as needed for chest pain. Qty: 50 tablet, Refills: 5    tamsulosin (FLOMAX) 0.4 MG CAPS capsule Take 0.4 mg by mouth daily.    VYTORIN 10-40 MG per tablet TAKE 1 TABLET NIGHTLY AT BEDTIME Qty: 90 tablet, Refills: 2      STOP taking these medications     carvedilol (COREG) 25 MG tablet      gabapentin (NEURONTIN) 300 MG capsule      lisinopril (PRINIVIL,ZESTRIL) 20 MG tablet        Allergies  Allergen Reactions  . Nsaids     REACTION: renal failure   Follow-up Information    Follow up with Candee Furbish, MD On 10/09/2014.   Specialty:  Cardiology   Why:  1:45pm   Contact information:   [address] [address] [phone]       Follow up with Garret Reddish,  MD.   Specialty:  Family Medicine   Contact information:   [address] [place]  [address] [phone]        The results of significant diagnostics from this hospitalization (including imaging, microbiology, ancillary and laboratory) are listed below for reference.    Significant Diagnostic Studies: Dg Chest 2 View  09/05/2014   CLINICAL DATA:  Diarrhea and vomiting today, difficulty urinating for 1.5 weeks, history UTIs, coronary artery disease post MI, stroke, pneumonia  EXAM: CHEST  2 VIEW  COMPARISON:  02/03/2013  FINDINGS: Enlargement of cardiac silhouette post CABG.  Slight pulmonary vascular congestion.  Chronic interstitial changes in mid to lower lungs with RIGHT basilar atelectasis and decreased lung volumes.  No definite infiltrate, pleural effusion or pneumothorax.  IMPRESSION: Enlargement of cardiac silhouette post CABG.  Chronic inches disease and RIGHT basilar atelectasis.   Electronically Signed   By: Lavonia Dana  M.D.   On: 09/05/2014 15:07    Microbiology: Recent Results (from the past 240 hour(s))  Blood culture (routine x 2)     Status: None   Collection Time: 09/05/14  1:38 PM  Result Value Ref Range Status   Specimen Description BLOOD RIGHT FOREARM  Final   Special Requests BOTTLES DRAWN AEROBIC AND ANAEROBIC 7ML  Final   Culture   Final    NO GROWTH 5 DAYS Performed at [facility]    Report Status 09/10/2014 FINAL  Final  Blood culture (routine x 2)     Status: None   Collection Time: 09/05/14  1:46 PM  Result Value Ref Range Status   Specimen Description BLOOD RIGHT ANTECUBITAL  Final   Special Requests BOTTLES DRAWN AEROBIC AND ANAEROBIC 8ML  Final   Culture   Final    NO GROWTH 5 DAYS Performed at [facility]    Report Status 09/10/2014 FINAL  Final  Urine culture     Status: None   Collection Time: 09/05/14  2:42 PM  Result Value Ref Range Status   Specimen Description URINE, CATHETERIZED  Final   Special Requests NONE  Final   Culture   Final    NO GROWTH 1 DAY Performed at [facility]    Report Status 09/06/2014 FINAL  Final  Stool culture     Status: None   Collection Time: 09/05/14  4:12 PM  Result Value Ref Range Status   Specimen Description STOOL  Final   Special Requests Normal  Final   Culture   Final    NO SALMONELLA, SHIGELLA, CAMPYLOBACTER, YERSINIA, OR E.COLI 0157:H7 ISOLATED Performed at [facility]    Report Status 09/09/2014 FINAL  Final  C difficile quick scan w PCR reflex     Status: None   Collection Time: 09/05/14  4:12 PM  Result Value Ref Range Status   C Diff antigen NEGATIVE NEGATIVE Final   C Diff toxin NEGATIVE NEGATIVE Final   C Diff interpretation Negative for toxigenic C. difficile  Final  MRSA PCR Screening     Status: None   Collection Time: 09/05/14  8:59 PM  Result Value Ref Range Status   MRSA by PCR NEGATIVE NEGATIVE Final    Comment:        The GeneXpert MRSA Assay (FDA approved for NASAL  specimens only), is one component of a comprehensive MRSA colonization surveillance program. It is not intended to diagnose MRSA infection nor to guide or monitor treatment for MRSA infections.      Labs: Basic  Metabolic Panel:  Recent Labs Lab 09/05/14 1347  09/08/14 0553 09/09/14 0423 09/10/14 0502 09/11/14 0510 09/12/14 0530  NA 142  < > 140 141 140 137 138  K 4.4  < > 4.3 3.9 3.5 3.5 3.6  CL 112*  < > 120* 117* 114* 109 109  CO2 19*  < > 14* 15* 16* 17* 19*  GLUCOSE 86  < > 101* 96 94 113* 103*  BUN 43*  < > 26* 25* 23* 22* 27*  CREATININE 2.65*  < > 1.34* 1.38* 1.42* 1.40* 1.53*  CALCIUM 8.8*  < > 7.9* 8.2* 8.4* 8.2* 8.1*  MG 1.8  --   --   --   --   --   --   < > = values in this interval not displayed. Liver Function Tests:  Recent Labs Lab 09/05/14 1347 09/06/14 0405 09/10/14 0502  AST 23 121* 39  ALT 13* 30 27  ALKPHOS 91 70 65  BILITOT 1.4* 0.7 0.6  PROT 6.7 6.2* 5.7*  ALBUMIN 4.0 3.3* 3.2*    Recent Labs Lab 09/05/14 1347  LIPASE 24   No results for input(s): AMMONIA in the last 168 hours. CBC:  Recent Labs Lab 09/05/14 1347 09/06/14 0405 09/07/14 0408 09/08/14 0553 09/11/14 1705 09/12/14 0530  WBC 10.0 15.0* 10.5 7.6 14.9* 11.4*  NEUTROABS 9.0*  --   --   --   --   --   HGB 12.1* 11.8* 11.5* 10.7* 11.9* 11.6*  HCT 38.6* 36.6* 35.3* 32.1* 36.5* 35.4*  MCV 101.8* 102.2* 101.1* 100.0 98.4 98.9  PLT 173 189 161 159 267 227   Cardiac Enzymes:  Recent Labs Lab 09/06/14 0100 09/06/14 0717 09/06/14 1450 09/07/14 0408 09/08/14 0906  TROPONINI 20.51* 48.92* 84.75* 50.08* 25.07*   BNP: BNP (last 3 results) No results for input(s): BNP in the last 8760 hours.  ProBNP (last 3 results) No results for input(s): PROBNP in the last 8760 hours.  CBG: No results for input(s): GLUCAP in the last 168 hours.     SignedNiel Hummer A  [facility] 09/12/2014, 11:03 AM

## 2014-09-12 NOTE — Progress Notes (Signed)
Report called to Elsie Lincoln at [facility]. Daughter Selena Batten, left to sign paperwork there. Waiting on [facility] to transport pt. Pt is dressed and ready for pick up. Will continue to monitor.  Arta Bruce [facility] 09/12/2014 4:14 PM

## 2014-09-12 NOTE — Care Management Note (Signed)
Case Management Note  Patient Details  Name: [name] MRN: [id number] Date of Birth: 01/23/1924  Subjective/Objective:                    Action/Plan:d/c SNF   Expected Discharge Date:   (unknown)               Expected Discharge Plan:  Skilled Nursing Facility  In-House Referral:  NA  Discharge planning Services  CM Consult  Post Acute Care Choice:  NA Choice offered to:  Patient  DME Arranged:    DME Agency:     HH Arranged:    HH Agency:     Status of Service:  Completed, signed off  Medicare Important Message Given:  Yes-third notification given Date Medicare IM Given:    Medicare IM give by:    Date Additional Medicare IM Given:    Additional Medicare Important Message give by:     If discussed at Long Length of Stay Meetings, dates discussed:    Additional Comments:  Lanier Clam, RN 09/12/2014, 12:59 PM

## 2014-09-12 NOTE — Progress Notes (Signed)
Occupational Therapy Treatment Patient Details Name: [name] MRN: [id number] DOB: 05/25/1924 Today's Date: 09/12/2014    History of present illness Patient is an 81 year old male past history of CAD status post CABG, stage III chronic kidney disease and severe aortic stenosis admitted on 8/25 with septic shock secondary to UTI   OT comments  Pt slower to respond this day and needed increased A  Follow Up Recommendations  SNF;Supervision/Assistance - 24 hour    Equipment Recommendations  None recommended by OT       Precautions / Restrictions Precautions Precautions: Fall Restrictions Weight Bearing Restrictions: No       Mobility Bed Mobility   Bed Mobility: Supine to Sit     Supine to sit: Max assist        Transfers Overall transfer level: Needs assistance Equipment used: Rolling walker (2 wheeled) Transfers: Sit to/from [employer] Sit to Stand: Min assist;Mod assist Stand pivot transfers: Min assist;Mod assist       General transfer comment: VC for safety and hand placement. Assist to rise, stabilize, control descent    Balance                                   ADL   Eating/Feeding: Minimal assistance;Sitting   Grooming: Minimal assistance;Sitting                   Toilet Transfer: Moderate assistance Toilet Transfer Details (indicate cue type and reason): bed to chair- pt needed increased A and increased time this day           General ADL Comments: pt needed increased time and increased A this OT visit.                  Cognition   Behavior During Therapy: WFL for tasks assessed/performed;Flat affect (slower to respond this day) Overall Cognitive Status: Within Functional Limits for tasks assessed                               General Comments      Pertinent Vitals/ Pain       Pain Score: 4  Pain Location: L and R shoulder Pain Descriptors / Indicators: Sore Pain  Intervention(s): Monitored during session  Home Living                                              Frequency       Progress Toward Goals  OT Goals(current goals can now be found in the care plan section)  Progress towards OT goals: OT to reassess next treatment     Plan Discharge plan needs to be updated    Co-evaluation                 End of Session     Activity Tolerance Patient limited by fatigue   Patient Left in chair;with chair alarm set;with call bell/phone within reach   Nurse Communication Mobility status        Time: 4098-1191 OT Time Calculation (min): 25 min  Charges: OT General Charges $OT Visit: 1 Procedure OT Treatments $Self Care/Home Management : 23-37 mins  [name] D 09/12/2014, 10:08 AM

## 2014-09-12 NOTE — Discharge Instructions (Signed)
Avoid salty food and limit daily fluid intake to <2 L per day

## 2014-09-12 NOTE — Care Management Note (Signed)
Case Management Note  Patient Details  Name: [name] MRN: [id number] Date of Birth: Dec 14, 1924  Subjective/Objective: Noted OT recc SNF.CSW following.                   Action/Plan:   Expected Discharge Date:   (unknown)               Expected Discharge Plan:  Home w Home Health Services  In-House Referral:  NA  Discharge planning Services  CM Consult  Post Acute Care Choice:  NA Choice offered to:  Patient  DME Arranged:    DME Agency:     HH Arranged:    HH Agency:  [facility]  Status of Service:  In process, will continue to follow  Medicare Important Message Given:  Yes-third notification given Date Medicare IM Given:    Medicare IM give by:    Date Additional Medicare IM Given:    Additional Medicare Important Message give by:     If discussed at Long Length of Stay Meetings, dates discussed:    Additional Comments:  Lanier Clam, RN 09/12/2014, 11:32 AM

## 2014-09-12 NOTE — Clinical Social Work Note (Signed)
Clinical Social Work Assessment  Patient Details  Name: [name] MRN: [id number] Date of Birth: [birth year]  Date of referral:  09/12/14               Reason for consult:  Facility Placement                Permission sought to share information with:  Oceanographer granted to share information::  Yes, Verbal Permission Granted  Name::        Agency::     Relationship::     Contact Information:     Housing/Transportation Living arrangements for the past 2 months:  Single Family Home Source of Information:  Patient, Adult Children Patient Interpreter Needed:  None Criminal Activity/Legal Involvement Pertinent to Current Situation/Hospitalization:  No - Comment as needed Significant Relationships:  None Lives with:  Self Do you feel safe going back to the place where you live?  No Need for family participation in patient care:  Yes (Comment)  Care giving concerns:  CSW received consult that OT is recommending SNF at this time.    Social Worker assessment / plan:  CSW spoke with patient & daughter at bedside who are both agreeable with plan for SNF.   Employment status:  Retired Engineer, mining PT Recommendations:  Home with [employer], Skilled Nursing Facility, 24 Hour Supervision Information / Referral to community resources:  Skilled Nursing Facility  Patient/Family's Response to care:  Patient states that he realizes that his daughter will not be able to take care of him at discharge & is agreeable to short stay at [facility].   Patient/Family's Understanding of and Emotional Response to Diagnosis, Current Treatment, and Prognosis:    Emotional Assessment Appearance:  Appears stated age Attitude/Demeanor/Rapport:    Affect (typically observed):  Quiet, Pleasant, Accepting Orientation:  Oriented to Self, Oriented to Place, Oriented to  Time, Oriented to Situation Alcohol / Substance use:    Psych involvement (Current and /or in the  community):     Discharge Needs  Concerns to be addressed:    Readmission within the last 30 days:    Current discharge risk:    Barriers to Discharge:      Arlyss Repress, LCSW 09/12/2014, 12:35 PM

## 2014-09-17 ENCOUNTER — Encounter: Payer: Self-pay | Admitting: Internal Medicine

## 2014-09-17 ENCOUNTER — Non-Acute Institutional Stay (SKILLED_NURSING_FACILITY): Payer: Medicare Other | Admitting: Internal Medicine

## 2014-09-17 DIAGNOSIS — I214 Non-ST elevation (NSTEMI) myocardial infarction: Secondary | ICD-10-CM

## 2014-09-17 DIAGNOSIS — I350 Nonrheumatic aortic (valve) stenosis: Secondary | ICD-10-CM

## 2014-09-17 DIAGNOSIS — N400 Enlarged prostate without lower urinary tract symptoms: Secondary | ICD-10-CM

## 2014-09-17 DIAGNOSIS — F05 Delirium due to known physiological condition: Secondary | ICD-10-CM | POA: Diagnosis not present

## 2014-09-17 DIAGNOSIS — N179 Acute kidney failure, unspecified: Secondary | ICD-10-CM | POA: Diagnosis not present

## 2014-09-17 DIAGNOSIS — M1A9XX0 Chronic gout, unspecified, without tophus (tophi): Secondary | ICD-10-CM | POA: Diagnosis not present

## 2014-09-17 DIAGNOSIS — I10 Essential (primary) hypertension: Secondary | ICD-10-CM

## 2014-09-17 DIAGNOSIS — E785 Hyperlipidemia, unspecified: Secondary | ICD-10-CM

## 2014-09-17 DIAGNOSIS — A419 Sepsis, unspecified organism: Secondary | ICD-10-CM

## 2014-09-17 DIAGNOSIS — N183 Chronic kidney disease, stage 3 unspecified: Secondary | ICD-10-CM

## 2014-09-17 NOTE — Progress Notes (Signed)
MRN: [id number] Name: [name]  Sex: male Age: 81 y.o. DOB: 10-29-1924  PSC #: [facility] Facility/Room:224 Level Of Care: SNF Provider: Merrilee Seashore D Emergency Contacts: Extended Emergency Contact Information Primary Emergency Contact: Judithann Sheen, [place] Home Phone: [phone] Work Phone: [phone] Relation: Daughter  Code Status:   Allergies: Nsaids  Chief Complaint  Patient presents with  . New Admit To SNF    HPI: Patient is 82 y.o. male with CAD status post CABG, diastolic heart failure stage III chronic kidney disease and severe aortic stenosis admitted from 8/25- 9/1  with septic shock secondary to UTI. Initial lab work significant for acute kidney injury with creatinine at 2.65 felt to be secondary to hypovolemia. Patient started on IV antibiotics and IV fluids. Initial troponin elevated at 1.24. Second troponin came back markedly elevated at 20 and continued to rise peaking at 84, before trending downward. Patient himself has remained chest pain-free while his NSTEMI was occuring. Pt is admitted to SNF for generalized weakness for OT/PT. While at SNF pt will be followed for gout, tx with allopurinol, BPH, tx with proscar and flomax and HLD, tx with vytorin.  Past Medical History  Diagnosis Date  . Coronary artery disease 2004  . Stroke   . Benign prostatic hypertrophy   . Colitis 12/16/2011  . Pneumonia 02/03/2013  . DIVERTICULOSIS, COLON 06/17/2006  . Aortic stenosis   . PVD (peripheral vascular disease)     subclavian stent (per Dr. Judd Gaudier note)  . Carotid stenosis     Past Surgical History  Procedure Laterality Date  . Appendectomy    . Coronary artery bypass graft  2004    LIMA-dLAD, SVG-OM1-OM2, SVG-AM  . Hemorrhoid surgery    . Knee arthroscopy    . Elbow surgery    . Cataract extraction      x2  . Carotid endarterectomy Left 08-11-04    cea  . Carotid endarterectomy Right 10-08-04    cea       Medication List       This list is accurate as of: 09/17/14 11:59 PM.  Always use your most recent med list.               allopurinol 300 MG tablet  Commonly known as:  ZYLOPRIM  TAKE 1 TABLET DAILY     aspirin 81 MG EC tablet  Take 1 tablet (81 mg total) by mouth daily.     cephALEXin 500 MG capsule  Commonly known as:  KEFLEX  Take 1 capsule (500 mg total) by mouth 2 (two) times daily.     cyclobenzaprine 10 MG tablet  Commonly known as:  FLEXERIL  Take 1 tablet (10 mg total) by mouth 3 (three) times daily as needed for muscle spasms (generalized muscle/joint pain).     finasteride 5 MG tablet  Commonly known as:  PROSCAR  Take 5 mg by mouth daily.     furosemide 40 MG tablet  Commonly known as:  LASIX  TAKE 1 TABLET DAILY     isosorbide mononitrate 30 MG 24 hr tablet  Commonly known as:  IMDUR  Take 0.5 tablets (15 mg total) by mouth daily.     LORazepam 0.5 MG tablet  Commonly known as:  ATIVAN  TAKE ONE TABLET BY MOUTH NIGHTLY AT BEDTIME AS NEEDED     metoprolol succinate 25 MG 24 hr tablet  Commonly known as:  TOPROL-XL  Take 0.5 tablets (12.5 mg total) by mouth 2 (two) times daily.     nitroGLYCERIN 0.4 MG SL tablet  Commonly known as:  NITROSTAT  Place 1 tablet (0.4 mg total) under the tongue every 5 (five) minutes as needed for chest pain.     sodium bicarbonate 650 MG tablet  Take 1 tablet (650 mg total) by mouth 2 (two) times daily.     tamsulosin 0.4 MG Caps capsule  Commonly known as:  FLOMAX  Take 0.4 mg by mouth daily.     VYTORIN 10-40 MG per tablet  Generic drug:  ezetimibe-simvastatin  TAKE 1 TABLET NIGHTLY AT BEDTIME        No orders of the defined types were placed in this encounter.    Immunization History  Administered Date(s) Administered  . Influenza Split 12/14/2010, 10/15/2011  . Influenza Whole 11/11/2006, 11/02/2007, 10/09/2008, 10/10/2009  . Influenza, High Dose Seasonal PF 11/03/2012  . Influenza,inj,Quad PF,36+ Mos  09/05/2013  . Pneumococcal Polysaccharide-23 10/11/2001  . Td 07/28/2007  . Tdap 09/26/2012    Social History  Substance Use Topics  . Smoking status: Former Smoker -- 1.00 packs/day for 40 years    Types: Cigarettes    Quit date: 01/12/1988  . Smokeless tobacco: Never Used  . Alcohol Use: No    Family history is + stroke, MI  Review of Systems  DATA OBTAINED: from patient, nurse, mostly nurse GENERAL:  no fevers, fatigue, appetite changes SKIN: No itching, rash or wounds EYES: No eye pain, redness, discharge EARS: No earache, tinnitus, change in hearing NOSE: No congestion, drainage or bleeding  MOUTH/THROAT: No mouth or tooth pain, No sore throat RESPIRATORY: No cough, wheezing, SOB CARDIAC: No chest pain, palpitations, lower extremity edema  GI: No abdominal pain, No N/V/D or constipation, No heartburn or reflux  GU: No dysuria, frequency or urgency, or incontinence  MUSCULOSKELETAL: No unrelieved bone/joint pain NEUROLOGIC: No headache, dizziness or focal weakness PSYCHIATRIC: No c/o anxiety or sadness   Filed Vitals:   09/17/14 2017  BP: 89/60  Pulse: 68  Temp: 98 F (36.7 C)  Resp: 18    SpO2 Readings from Last 1 Encounters:  09/12/14 94%        Physical Exam  GENERAL APPEARANCE: Alert, conversant,  No acute distress.  SKIN: No diaphoresis rash HEAD: Normocephalic, atraumatic  EYES: Conjunctiva/lids clear. Pupils round, reactive. EOMs intact.  EARS: External exam WNL, canals clear. Hearing grossly normal.  NOSE: No deformity or discharge.  MOUTH/THROAT: Lips w/o lesions  RESPIRATORY: Breathing is even, unlabored. Lung sounds are clear   CARDIOVASCULAR: Heart RRR no murmurs, rubs or gallops. No peripheral edema.   GASTROINTESTINAL: Abdomen is soft, non-tender, not distended w/ normal bowel sounds. GENITOURINARY: Bladder non tender, not distended  MUSCULOSKELETAL: No abnormal joints or musculature NEUROLOGIC:  Cranial nerves 2-12 grossly intact.  Moves all extremities  PSYCHIATRIC: appears some dementia, no behavioral issues  Patient Active Problem List   Diagnosis Date Noted  . Acute confusional state 09/19/2014  . BRBPR (bright red blood per rectum) 09/05/2014  . Sepsis 09/05/2014  . UTI (lower urinary tract infection) 09/05/2014  . AKI (acute kidney injury) 09/05/2014  . Chronic combined systolic and diastolic congestive heart failure 09/05/2014  . PVD (peripheral vascular disease) 10/05/2013  . PAC (premature atrial contraction) 10/05/2013  . Chronic pain syndrome 09/05/2013  . False positive HIV serology   . NSTEMI (non-ST elevated myocardial infarction) 02/06/2013  . Severe aortic stenosis 02/06/2013  . Cardiomyopathy, ischemic 02/06/2013  .  BPH (benign prostatic hyperplasia) 02/03/2013  . Carotid stenosis 12/08/2011  . INSOMNIA-SLEEP DISORDER-UNSPEC 11/26/2009  . Gout 12/31/2008  . Acute renal failure superimposed on stage 3 chronic kidney disease 12/25/2008  . ANEMIA 01/30/2008  . CAD (coronary artery disease) 01/30/2008  . Hyperlipidemia 06/17/2006  . Essential hypertension 06/17/2006    CBC    Component Value Date/Time   WBC 11.4* 09/12/2014 0530   RBC 3.58* 09/12/2014 0530   HGB 11.6* 09/12/2014 0530   HCT 35.4* 09/12/2014 0530   PLT 227 09/12/2014 0530   MCV 98.9 09/12/2014 0530   LYMPHSABS 0.5* 09/05/2014 1347   MONOABS 0.5 09/05/2014 1347   EOSABS 0.0 09/05/2014 1347   BASOSABS 0.0 09/05/2014 1347    CMP     Component Value Date/Time   NA 138 09/12/2014 0530   K 3.6 09/12/2014 0530   CL 109 09/12/2014 0530   CO2 19* 09/12/2014 0530   GLUCOSE 103* 09/12/2014 0530   GLUCOSE 101* 11/10/2005 0847   BUN 27* 09/12/2014 0530   CREATININE 1.53* 09/12/2014 0530   CALCIUM 8.1* 09/12/2014 0530   PROT 5.7* 09/10/2014 0502   ALBUMIN 3.2* 09/10/2014 0502   AST 39 09/10/2014 0502   ALT 27 09/10/2014 0502   ALKPHOS 65 09/10/2014 0502   BILITOT 0.6 09/10/2014 0502   GFRNONAA 39* 09/12/2014 0530    GFRAA 45* 09/12/2014 0530    No results found for: HGBA1C   Dg Chest 2 View  09/05/2014   CLINICAL DATA:  Diarrhea and vomiting today, difficulty urinating for 1.5 weeks, history UTIs, coronary artery disease post MI, stroke, pneumonia  EXAM: CHEST  2 VIEW  COMPARISON:  02/03/2013  FINDINGS: Enlargement of cardiac silhouette post CABG.  Slight pulmonary vascular congestion.  Chronic interstitial changes in mid to lower lungs with RIGHT basilar atelectasis and decreased lung volumes.  No definite infiltrate, pleural effusion or pneumothorax.  IMPRESSION: Enlargement of cardiac silhouette post CABG.  Chronic inches disease and RIGHT basilar atelectasis.   Electronically Signed   By: Ulyses Southward M.D.   On: 09/05/2014 15:07    Not all labs, radiology exams or other studies done during hospitalization come through on my EPIC note; however they are reviewed by me.    Assessment and Plan  Sepsis Patient meets criteria given lactic acidosis, fever and tachycardia with urinary source. Suspect increase in white count may be more due to stress margination from non-ST elevated MI. Urine cultures show no growth yet. No other source of infection noted.  C diff negative,.  Follow WBC trend. received 3 days of ceftriaxone.  SNF - Keflex for 2 more days after admission   Acute renal failure superimposed on stage 3 chronic kidney disease Continues to improve, near baseline;  Start sodium bicarb tablet. Acidosis improving, not resolved  SNF - continue sodim bicarb,follow with repeat BMP back to old or new baseline  NSTEMI (non-ST elevated myocardial infarction)  Likely started prior to admission. Secondary to sepsis. Being followed by cardiology. Recommendation is to try to manage conservatively and treat sepsis. Echocardiogram notes mildly decreased ejection fraction, diastolic dysfunction and stenosed aortic valve. Metoprolol started. Cardiology restarting Imdur .Outpatient nuclear stress test. Not a  candidate for catheterization  SNF - cont imdur, metoprolol , statin, ASA  Essential hypertension Stable; SNF - cont lasix, imdur metoprolol  Severe aortic stenosis on lasix. Cardiology following.  Acute confusional state Hospitalist suspected prolonged hospitalization brought out mild dementia; not severely agitated, rec, avoid sedating him ; SNF - objective testing  for dementia  BPH (benign prostatic hyperplasia) SNF - cont proscar and flomax  Gout Stable ;SNF - cont allopurinol  Hyperlipidemia SNF- Recent LDL -62, HDL - 31; cont vytorin   Time spent > 45 min; > 50% of time with patient was spent reviewing records, labs, tests and studies, counseling and developing plan of care  Margit Hanks, MD

## 2014-09-19 ENCOUNTER — Encounter: Payer: Self-pay | Admitting: Internal Medicine

## 2014-09-19 DIAGNOSIS — F05 Delirium due to known physiological condition: Secondary | ICD-10-CM | POA: Insufficient documentation

## 2014-09-19 NOTE — Assessment & Plan Note (Signed)
Patient meets criteria given lactic acidosis, fever and tachycardia with urinary source. Suspect increase in white count may be more due to stress margination from non-ST elevated MI. Urine cultures show no growth yet. No other source of infection noted.  C diff negative,.  Follow WBC trend. received 3 days of ceftriaxone.  SNF - Keflex for 2 more days after admission

## 2014-09-19 NOTE — Assessment & Plan Note (Signed)
Stable ;SNF - cont allopurinol

## 2014-09-19 NOTE — Assessment & Plan Note (Signed)
on lasix. Cardiology following.

## 2014-09-19 NOTE — Assessment & Plan Note (Signed)
Likely started prior to admission. Secondary to sepsis. Being followed by cardiology. Recommendation is to try to manage conservatively and treat sepsis. Echocardiogram notes mildly decreased ejection fraction, diastolic dysfunction and stenosed aortic valve. Metoprolol started. Cardiology restarting Imdur .Outpatient nuclear stress test. Not a candidate for catheterization  SNF - cont imdur, metoprolol , statin, ASA

## 2014-09-19 NOTE — Assessment & Plan Note (Signed)
Hospitalist suspected prolonged hospitalization brought out mild dementia; not severely agitated, rec, avoid sedating him ; SNF - objective testing for dementia

## 2014-09-19 NOTE — Assessment & Plan Note (Signed)
SNF - cont proscar and flomax

## 2014-09-19 NOTE — Assessment & Plan Note (Signed)
Continues to improve, near baseline;  Start sodium bicarb tablet. Acidosis improving, not resolved  SNF - continue sodim bicarb,follow with repeat BMP back to old or new baseline

## 2014-09-19 NOTE — Assessment & Plan Note (Signed)
Stable; SNF - cont lasix, imdur metoprolol

## 2014-09-19 NOTE — Assessment & Plan Note (Signed)
SNF- Recent LDL -62, HDL - 31; cont vytorin

## 2014-09-26 ENCOUNTER — Encounter: Payer: Self-pay | Admitting: Internal Medicine

## 2014-09-26 ENCOUNTER — Non-Acute Institutional Stay (SKILLED_NURSING_FACILITY): Payer: Medicare Other | Admitting: Internal Medicine

## 2014-09-26 DIAGNOSIS — R627 Adult failure to thrive: Secondary | ICD-10-CM

## 2014-09-26 NOTE — Progress Notes (Signed)
MRN: [id number] Name: [name]  Sex: male Age: 81 y.o. DOB: [birth year]  [facility] #: [facility] Facility/Room:224 Level Of Care: SNF Provider: Merrilee Seashore D Emergency Contacts: Extended Emergency Contact Information Primary Emergency Contact: Judithann Sheen, [place] Home Phone: [phone] Work Phone: [phone] Relation: Daughter  Code Status:   Allergies: Nsaids  Chief Complaint  Patient presents with  . Acute Visit    HPI: Patient is 81 y.o. male who CAD status post CABG, diastolic heart failure stage III chronic kidney disease and severe aortic stenosis admitted from 8/25- 9/1 with septic shock secondary to UTI. Initial lab work significant for acute kidney injury with creatinine at 2.65 felt to be secondary to hypovolemia. Also suffered a NSTEMI without having CP. Pt was admitted to SNF for generalized weakness. Nurses have asked me to see pt because he has not been eating or drinking since he came. He is reported to be crying and want to go home. Daily his responsiveness is less. It is reported that daughter visits him usually in the am.  Past Medical History  Diagnosis Date  . Coronary artery disease 2004  . Stroke   . Benign prostatic hypertrophy   . Colitis 12/16/2011  . Pneumonia 02/03/2013  . DIVERTICULOSIS, COLON 06/17/2006  . Aortic stenosis   . PVD (peripheral vascular disease)     subclavian stent (per Dr. Judd Gaudier note)  . Carotid stenosis     Past Surgical History  Procedure Laterality Date  . Appendectomy    . Coronary artery bypass graft  2004    LIMA-dLAD, SVG-OM1-OM2, SVG-AM  . Hemorrhoid surgery    . Knee arthroscopy    . Elbow surgery    . Cataract extraction      x2  . Carotid endarterectomy Left 08-11-04    cea  . Carotid endarterectomy Right 10-08-04    cea      Medication List       This list is accurate as of: 09/26/14 11:59 PM.  Always use your most recent med list.               allopurinol 300 MG tablet  Commonly known as:  ZYLOPRIM  TAKE 1 TABLET DAILY     aspirin 81 MG EC tablet  Take 1 tablet (81 mg total) by mouth daily.     cephALEXin 500 MG capsule  Commonly known as:  KEFLEX  Take 1 capsule (500 mg total) by mouth 2 (two) times daily.     cyclobenzaprine 10 MG tablet  Commonly known as:  FLEXERIL  Take 1 tablet (10 mg total) by mouth 3 (three) times daily as needed for muscle spasms (generalized muscle/joint pain).     finasteride 5 MG tablet  Commonly known as:  PROSCAR  Take 5 mg by mouth daily.     furosemide 40 MG tablet  Commonly known as:  LASIX  TAKE 1 TABLET DAILY     isosorbide mononitrate 30 MG 24 hr tablet  Commonly known as:  IMDUR  Take 0.5 tablets (15 mg total) by mouth daily.     LORazepam 0.5 MG tablet  Commonly known as:  ATIVAN  TAKE ONE TABLET BY MOUTH NIGHTLY AT BEDTIME AS NEEDED     metoprolol succinate 25 MG 24 hr tablet  Commonly known as:  TOPROL-XL  Take 0.5 tablets (12.5 mg total) by mouth 2 (two) times daily.     nitroGLYCERIN 0.4 MG  SL tablet  Commonly known as:  NITROSTAT  Place 1 tablet (0.4 mg total) under the tongue every 5 (five) minutes as needed for chest pain.     sodium bicarbonate 650 MG tablet  Take 1 tablet (650 mg total) by mouth 2 (two) times daily.     tamsulosin 0.4 MG Caps capsule  Commonly known as:  FLOMAX  Take 0.4 mg by mouth daily.     VYTORIN 10-40 MG per tablet  Generic drug:  ezetimibe-simvastatin  TAKE 1 TABLET NIGHTLY AT BEDTIME        No orders of the defined types were placed in this encounter.    Immunization History  Administered Date(s) Administered  . Influenza Split 12/14/2010, 10/15/2011  . Influenza Whole 11/11/2006, 11/02/2007, 10/09/2008, 10/10/2009  . Influenza, High Dose Seasonal PF 11/03/2012  . Influenza,inj,Quad PF,36+ Mos 09/05/2013  . Pneumococcal Polysaccharide-23 10/11/2001  . Td 07/28/2007  . Tdap 09/26/2012    Social History  Substance Use  Topics  . Smoking status: Former Smoker -- 1.00 packs/day for 40 years    Types: Cigarettes    Quit date: 01/12/1988  . Smokeless tobacco: Never Used  . Alcohol Use: No    Review of Systems  DATA OBTAINED: from nurse, daughter GENERAL:  no fevers, fatigue, refusing food and drink per nurse, drinks pepsi a lot per daughter SKIN: No itching, rash HEENT: No complaint RESPIRATORY: No cough, wheezing, SOB CARDIAC: No chest pain, palpitations, lower extremity edema  GI: No abdominal pain, No N/V/D or constipation, No heartburn or reflux  GU: No dysuria, frequency or urgency, or incontinence  MUSCULOSKELETAL: No unrelieved bone/joint pain NEUROLOGIC: No headache, dizziness  PSYCHIATRIC: reported crying and wants to go home  Filed Vitals:   09/26/14 2055  BP: 115/86  Pulse: 75  Temp: 97.4 F (36.3 C)  Resp: 18    Physical Exam  GENERAL APPEARANCE: Alert, conversant, No acute distress; partially drunk ensure at bedside, 1/2 cup clear beverage at bedside  SKIN: No diaphoresis rash, or wounds HEENT: MM are dry RESPIRATORY: Breathing is even, unlabored. Lung sounds are clear   CARDIOVASCULAR: Heart RRR with pauses, 4/6 high pitched murmur, rubs or gallops. No peripheral edema  GASTROINTESTINAL: Abdomen is soft, non-tender, not distended w/ normal bowel sounds.  GENITOURINARY: Bladder non tender, not distended  MUSCULOSKELETAL: No abnormal joints or musculature NEUROLOGIC: Cranial nerves 2-12 grossly intact. Moves all extremities PSYCHIATRIC: Speaking, confused, no behavioral issues  Patient Active Problem List   Diagnosis Date Noted  . FTT (failure to thrive) in adult 10/06/2014  . Acute confusional state 09/19/2014  . BRBPR (bright red blood per rectum) 09/05/2014  . Sepsis 09/05/2014  . UTI (lower urinary tract infection) 09/05/2014  . AKI (acute kidney injury) 09/05/2014  . Chronic combined systolic and diastolic congestive heart failure 09/05/2014  . PVD (peripheral  vascular disease) 10/05/2013  . PAC (premature atrial contraction) 10/05/2013  . Chronic pain syndrome 09/05/2013  . False positive HIV serology   . NSTEMI (non-ST elevated myocardial infarction) 02/06/2013  . Severe aortic stenosis 02/06/2013  . Cardiomyopathy, ischemic 02/06/2013  . BPH (benign prostatic hyperplasia) 02/03/2013  . Carotid stenosis 12/08/2011  . INSOMNIA-SLEEP DISORDER-UNSPEC 11/26/2009  . Gout 12/31/2008  . Acute renal failure superimposed on stage 3 chronic kidney disease 12/25/2008  . ANEMIA 01/30/2008  . CAD (coronary artery disease) 01/30/2008  . Hyperlipidemia 06/17/2006  . Essential hypertension 06/17/2006    CBC    Component Value Date/Time   WBC 11.4* 09/12/2014 0530  RBC 3.58* 09/12/2014 0530   HGB 11.6* 09/12/2014 0530   HCT 35.4* 09/12/2014 0530   PLT 227 09/12/2014 0530   MCV 98.9 09/12/2014 0530   LYMPHSABS 0.5* 09/05/2014 1347   MONOABS 0.5 09/05/2014 1347   EOSABS 0.0 09/05/2014 1347   BASOSABS 0.0 09/05/2014 1347    CMP     Component Value Date/Time   NA 138 09/12/2014 0530   K 3.6 09/12/2014 0530   CL 109 09/12/2014 0530   CO2 19* 09/12/2014 0530   GLUCOSE 103* 09/12/2014 0530   GLUCOSE 101* 11/10/2005 0847   BUN 27* 09/12/2014 0530   CREATININE 1.53* 09/12/2014 0530   CALCIUM 8.1* 09/12/2014 0530   PROT 5.7* 09/10/2014 0502   ALBUMIN 3.2* 09/10/2014 0502   AST 39 09/10/2014 0502   ALT 27 09/10/2014 0502   ALKPHOS 65 09/10/2014 0502   BILITOT 0.6 09/10/2014 0502   GFRNONAA 39* 09/12/2014 0530   GFRAA 45* 09/12/2014 0530    Assessment and Plan  FTT (failure to thrive) in adult Pt's lasix held for the time being while po intake is reduced. BMP ordered. I spoke with pt's daughter, POA, at length. Prior to hospitalization pt was living alone with his cats and driving his car, picked her up from work on Thursdays to go to lunch. Since at SNF pt has been refusing food and help, although daughter says he drinks his Pepsi,and so  its not possible to tell whether he could progress or this is his new baseline. Daughter has an usual social circumstance, so he can't come to her house and he can't go back to his house. Per daughter pt has very rigid beliefs for years and has alienated the rest of the family. Pt is alone in making decisions. I discussed that these beliefs may get in out way now of his getting better/getting help.. Pt was OK with IVF ,if that was possible, she's knows that's just temporary, and she is not opposed to Psych consult. She said her father would't be happy if he could not have his apartment, independence and cats.  Psych consulted and Palliative care was consulted also.   Time spent > 45;> 50% of time with patient was spent reviewing records, labs, tests and studies, counseling and developing plan of care  Margit Hanks, MD

## 2014-09-29 ENCOUNTER — Inpatient Hospital Stay (HOSPITAL_COMMUNITY)
Admission: EM | Admit: 2014-09-29 | Discharge: 2014-10-12 | DRG: 871 | Disposition: E | Payer: Medicare Other | Attending: Family Medicine | Admitting: Family Medicine

## 2014-09-29 ENCOUNTER — Emergency Department (HOSPITAL_COMMUNITY): Payer: Medicare Other

## 2014-09-29 ENCOUNTER — Encounter: Payer: Self-pay | Admitting: Internal Medicine

## 2014-09-29 ENCOUNTER — Encounter (HOSPITAL_COMMUNITY): Payer: Self-pay | Admitting: *Deleted

## 2014-09-29 DIAGNOSIS — F4321 Adjustment disorder with depressed mood: Secondary | ICD-10-CM | POA: Diagnosis present

## 2014-09-29 DIAGNOSIS — R531 Weakness: Secondary | ICD-10-CM | POA: Diagnosis not present

## 2014-09-29 DIAGNOSIS — N390 Urinary tract infection, site not specified: Secondary | ICD-10-CM | POA: Diagnosis present

## 2014-09-29 DIAGNOSIS — N179 Acute kidney failure, unspecified: Secondary | ICD-10-CM | POA: Diagnosis present

## 2014-09-29 DIAGNOSIS — Z6823 Body mass index (BMI) 23.0-23.9, adult: Secondary | ICD-10-CM

## 2014-09-29 DIAGNOSIS — R410 Disorientation, unspecified: Secondary | ICD-10-CM | POA: Diagnosis not present

## 2014-09-29 DIAGNOSIS — Z87891 Personal history of nicotine dependence: Secondary | ICD-10-CM | POA: Diagnosis not present

## 2014-09-29 DIAGNOSIS — Y95 Nosocomial condition: Secondary | ICD-10-CM | POA: Diagnosis present

## 2014-09-29 DIAGNOSIS — I5042 Chronic combined systolic (congestive) and diastolic (congestive) heart failure: Secondary | ICD-10-CM | POA: Diagnosis present

## 2014-09-29 DIAGNOSIS — G9341 Metabolic encephalopathy: Secondary | ICD-10-CM | POA: Diagnosis present

## 2014-09-29 DIAGNOSIS — J9601 Acute respiratory failure with hypoxia: Secondary | ICD-10-CM | POA: Diagnosis present

## 2014-09-29 DIAGNOSIS — Z951 Presence of aortocoronary bypass graft: Secondary | ICD-10-CM

## 2014-09-29 DIAGNOSIS — R627 Adult failure to thrive: Secondary | ICD-10-CM | POA: Insufficient documentation

## 2014-09-29 DIAGNOSIS — I129 Hypertensive chronic kidney disease with stage 1 through stage 4 chronic kidney disease, or unspecified chronic kidney disease: Secondary | ICD-10-CM | POA: Diagnosis present

## 2014-09-29 DIAGNOSIS — F05 Delirium due to known physiological condition: Secondary | ICD-10-CM | POA: Diagnosis present

## 2014-09-29 DIAGNOSIS — E785 Hyperlipidemia, unspecified: Secondary | ICD-10-CM | POA: Diagnosis present

## 2014-09-29 DIAGNOSIS — Z7982 Long term (current) use of aspirin: Secondary | ICD-10-CM | POA: Diagnosis not present

## 2014-09-29 DIAGNOSIS — N183 Chronic kidney disease, stage 3 unspecified: Secondary | ICD-10-CM | POA: Diagnosis present

## 2014-09-29 DIAGNOSIS — E876 Hypokalemia: Secondary | ICD-10-CM | POA: Diagnosis present

## 2014-09-29 DIAGNOSIS — Z66 Do not resuscitate: Secondary | ICD-10-CM | POA: Diagnosis present

## 2014-09-29 DIAGNOSIS — R778 Other specified abnormalities of plasma proteins: Secondary | ICD-10-CM | POA: Diagnosis present

## 2014-09-29 DIAGNOSIS — D638 Anemia in other chronic diseases classified elsewhere: Secondary | ICD-10-CM | POA: Diagnosis present

## 2014-09-29 DIAGNOSIS — I350 Nonrheumatic aortic (valve) stenosis: Secondary | ICD-10-CM | POA: Diagnosis present

## 2014-09-29 DIAGNOSIS — J189 Pneumonia, unspecified organism: Secondary | ICD-10-CM | POA: Diagnosis present

## 2014-09-29 DIAGNOSIS — I739 Peripheral vascular disease, unspecified: Secondary | ICD-10-CM | POA: Diagnosis present

## 2014-09-29 DIAGNOSIS — D649 Anemia, unspecified: Secondary | ICD-10-CM | POA: Diagnosis present

## 2014-09-29 DIAGNOSIS — I252 Old myocardial infarction: Secondary | ICD-10-CM

## 2014-09-29 DIAGNOSIS — E860 Dehydration: Secondary | ICD-10-CM | POA: Diagnosis present

## 2014-09-29 DIAGNOSIS — N400 Enlarged prostate without lower urinary tract symptoms: Secondary | ICD-10-CM | POA: Diagnosis present

## 2014-09-29 DIAGNOSIS — F0390 Unspecified dementia without behavioral disturbance: Secondary | ICD-10-CM | POA: Diagnosis present

## 2014-09-29 DIAGNOSIS — Z8673 Personal history of transient ischemic attack (TIA), and cerebral infarction without residual deficits: Secondary | ICD-10-CM | POA: Diagnosis not present

## 2014-09-29 DIAGNOSIS — I255 Ischemic cardiomyopathy: Secondary | ICD-10-CM | POA: Diagnosis present

## 2014-09-29 DIAGNOSIS — R9431 Abnormal electrocardiogram [ECG] [EKG]: Secondary | ICD-10-CM | POA: Diagnosis present

## 2014-09-29 DIAGNOSIS — E46 Unspecified protein-calorie malnutrition: Secondary | ICD-10-CM | POA: Diagnosis present

## 2014-09-29 DIAGNOSIS — Z79899 Other long term (current) drug therapy: Secondary | ICD-10-CM | POA: Diagnosis not present

## 2014-09-29 DIAGNOSIS — A419 Sepsis, unspecified organism: Principal | ICD-10-CM | POA: Diagnosis present

## 2014-09-29 DIAGNOSIS — I2510 Atherosclerotic heart disease of native coronary artery without angina pectoris: Secondary | ICD-10-CM | POA: Diagnosis present

## 2014-09-29 DIAGNOSIS — I10 Essential (primary) hypertension: Secondary | ICD-10-CM | POA: Diagnosis present

## 2014-09-29 DIAGNOSIS — R7989 Other specified abnormal findings of blood chemistry: Secondary | ICD-10-CM | POA: Diagnosis present

## 2014-09-29 DIAGNOSIS — J9811 Atelectasis: Secondary | ICD-10-CM | POA: Diagnosis present

## 2014-09-29 DIAGNOSIS — R52 Pain, unspecified: Secondary | ICD-10-CM

## 2014-09-29 HISTORY — DX: Disorder of kidney and ureter, unspecified: N28.9

## 2014-09-29 HISTORY — DX: Non-ST elevation (NSTEMI) myocardial infarction: I21.4

## 2014-09-29 HISTORY — DX: Heart failure, unspecified: I50.9

## 2014-09-29 HISTORY — DX: Essential (primary) hypertension: I10

## 2014-09-29 LAB — I-STAT TROPONIN, ED: Troponin i, poc: 2.05 ng/mL (ref 0.00–0.08)

## 2014-09-29 LAB — CBC WITH DIFFERENTIAL/PLATELET
BASOS ABS: 0.1 10*3/uL (ref 0.0–0.1)
Basophils Relative: 1 %
EOS PCT: 4 %
Eosinophils Absolute: 0.4 10*3/uL (ref 0.0–0.7)
HEMATOCRIT: 36.6 % — AB (ref 39.0–52.0)
Hemoglobin: 11.3 g/dL — ABNORMAL LOW (ref 13.0–17.0)
LYMPHS ABS: 1.6 10*3/uL (ref 0.7–4.0)
LYMPHS PCT: 19 %
MCH: 30.7 pg (ref 26.0–34.0)
MCHC: 30.9 g/dL (ref 30.0–36.0)
MCV: 99.5 fL (ref 78.0–100.0)
MONO ABS: 0.7 10*3/uL (ref 0.1–1.0)
MONOS PCT: 8 %
NEUTROS ABS: 5.8 10*3/uL (ref 1.7–7.7)
Neutrophils Relative %: 68 %
PLATELETS: 300 10*3/uL (ref 150–400)
RBC: 3.68 MIL/uL — ABNORMAL LOW (ref 4.22–5.81)
RDW: 14.3 % (ref 11.5–15.5)
WBC: 8.6 10*3/uL (ref 4.0–10.5)

## 2014-09-29 LAB — COMPREHENSIVE METABOLIC PANEL
ALBUMIN: 2.9 g/dL — AB (ref 3.5–5.0)
ALT: 21 U/L (ref 17–63)
ANION GAP: 12 (ref 5–15)
AST: 42 U/L — AB (ref 15–41)
Alkaline Phosphatase: 73 U/L (ref 38–126)
BILIRUBIN TOTAL: 1.3 mg/dL — AB (ref 0.3–1.2)
BUN: 36 mg/dL — AB (ref 6–20)
CHLORIDE: 101 mmol/L (ref 101–111)
CO2: 30 mmol/L (ref 22–32)
Calcium: 8.5 mg/dL — ABNORMAL LOW (ref 8.9–10.3)
Creatinine, Ser: 2.3 mg/dL — ABNORMAL HIGH (ref 0.61–1.24)
GFR calc Af Amer: 28 mL/min — ABNORMAL LOW (ref >60)
GFR calc non Af Amer: 24 mL/min — ABNORMAL LOW (ref >60)
GLUCOSE: 114 mg/dL — AB (ref 65–99)
POTASSIUM: 3.3 mmol/L — AB (ref 3.5–5.1)
SODIUM: 143 mmol/L (ref 135–145)
Total Protein: 6.2 g/dL — ABNORMAL LOW (ref 6.5–8.1)

## 2014-09-29 LAB — URINALYSIS, ROUTINE W REFLEX MICROSCOPIC
GLUCOSE, UA: NEGATIVE mg/dL
Ketones, ur: 15 mg/dL — AB
NITRITE: NEGATIVE
PH: 5.5 (ref 5.0–8.0)
Protein, ur: 100 mg/dL — AB
SPECIFIC GRAVITY, URINE: 1.017 (ref 1.005–1.030)
Urobilinogen, UA: 1 mg/dL (ref 0.0–1.0)

## 2014-09-29 LAB — URINE MICROSCOPIC-ADD ON

## 2014-09-29 LAB — MAGNESIUM: Magnesium: 1.7 mg/dL (ref 1.7–2.4)

## 2014-09-29 LAB — I-STAT CG4 LACTIC ACID, ED
LACTIC ACID, VENOUS: 1.19 mmol/L (ref 0.5–2.0)
Lactic Acid, Venous: 1.89 mmol/L (ref 0.5–2.0)

## 2014-09-29 LAB — TSH: TSH: 1.455 u[IU]/mL (ref 0.350–4.500)

## 2014-09-29 LAB — CK: CK TOTAL: 208 U/L (ref 49–397)

## 2014-09-29 LAB — TROPONIN I: TROPONIN I: 2.06 ng/mL — AB (ref <0.031)

## 2014-09-29 MED ORDER — PIPERACILLIN-TAZOBACTAM 3.375 G IVPB
3.3750 g | Freq: Three times a day (TID) | INTRAVENOUS | Status: DC
  Administered 2014-09-29 – 2014-10-01 (×7): 3.375 g via INTRAVENOUS
  Filled 2014-09-29 (×9): qty 50

## 2014-09-29 MED ORDER — LORAZEPAM 2 MG/ML IJ SOLN
0.5000 mg | Freq: Once | INTRAMUSCULAR | Status: AC
  Administered 2014-09-29: 0.5 mg via INTRAVENOUS
  Filled 2014-09-29: qty 1

## 2014-09-29 MED ORDER — PIPERACILLIN-TAZOBACTAM 3.375 G IVPB
3.3750 g | Freq: Three times a day (TID) | INTRAVENOUS | Status: DC
  Filled 2014-09-29 (×2): qty 50

## 2014-09-29 MED ORDER — VANCOMYCIN HCL IN DEXTROSE 1-5 GM/200ML-% IV SOLN
1000.0000 mg | Freq: Once | INTRAVENOUS | Status: AC
  Administered 2014-09-29: 1000 mg via INTRAVENOUS
  Filled 2014-09-29: qty 200

## 2014-09-29 MED ORDER — SODIUM CHLORIDE 0.9 % IV BOLUS (SEPSIS)
500.0000 mL | INTRAVENOUS | Status: AC
  Administered 2014-09-29: 500 mL via INTRAVENOUS

## 2014-09-29 MED ORDER — SODIUM CHLORIDE 0.9 % IV BOLUS (SEPSIS)
1000.0000 mL | INTRAVENOUS | Status: AC
  Administered 2014-09-29 (×2): 1000 mL via INTRAVENOUS

## 2014-09-29 MED ORDER — TAMSULOSIN HCL 0.4 MG PO CAPS
0.4000 mg | ORAL_CAPSULE | Freq: Every day | ORAL | Status: DC
  Administered 2014-09-30 – 2014-10-01 (×2): 0.4 mg via ORAL
  Filled 2014-09-29 (×2): qty 1

## 2014-09-29 MED ORDER — ENOXAPARIN SODIUM 30 MG/0.3ML ~~LOC~~ SOLN
30.0000 mg | SUBCUTANEOUS | Status: DC
  Administered 2014-09-29 – 2014-10-01 (×3): 30 mg via SUBCUTANEOUS
  Filled 2014-09-29 (×3): qty 0.3

## 2014-09-29 MED ORDER — FINASTERIDE 5 MG PO TABS
5.0000 mg | ORAL_TABLET | Freq: Every day | ORAL | Status: DC
  Administered 2014-09-30 – 2014-10-01 (×2): 5 mg via ORAL
  Filled 2014-09-29 (×2): qty 1

## 2014-09-29 MED ORDER — KCL IN DEXTROSE-NACL 20-5-0.9 MEQ/L-%-% IV SOLN
INTRAVENOUS | Status: DC
  Administered 2014-09-29 – 2014-10-01 (×3): via INTRAVENOUS
  Administered 2014-10-01: 100 mL via INTRAVENOUS
  Filled 2014-09-29 (×8): qty 1000

## 2014-09-29 MED ORDER — ENSURE ENLIVE PO LIQD
237.0000 mL | Freq: Two times a day (BID) | ORAL | Status: DC
  Administered 2014-09-30: 237 mL via ORAL

## 2014-09-29 MED ORDER — POTASSIUM CHLORIDE CRYS ER 20 MEQ PO TBCR
40.0000 meq | EXTENDED_RELEASE_TABLET | Freq: Once | ORAL | Status: AC
  Administered 2014-09-29: 40 meq via ORAL
  Filled 2014-09-29: qty 2

## 2014-09-29 MED ORDER — ASPIRIN 81 MG PO CHEW
324.0000 mg | CHEWABLE_TABLET | Freq: Once | ORAL | Status: AC
  Administered 2014-09-29: 324 mg via ORAL
  Filled 2014-09-29: qty 4

## 2014-09-29 MED ORDER — VANCOMYCIN HCL IN DEXTROSE 1-5 GM/200ML-% IV SOLN
1000.0000 mg | INTRAVENOUS | Status: DC
  Administered 2014-10-01: 1000 mg via INTRAVENOUS
  Filled 2014-09-29: qty 200

## 2014-09-29 MED ORDER — PIPERACILLIN-TAZOBACTAM 3.375 G IVPB
3.3750 g | Freq: Once | INTRAVENOUS | Status: AC
Start: 2014-09-29 — End: 2014-09-29
  Administered 2014-09-29: 3.375 g via INTRAVENOUS
  Filled 2014-09-29: qty 50

## 2014-09-29 MED ORDER — PIPERACILLIN-TAZOBACTAM 3.375 G IVPB: 3.3750 g | Freq: Three times a day (TID) | INTRAVENOUS | Status: DC

## 2014-09-29 MED ORDER — BOOST / RESOURCE BREEZE PO LIQD
1.0000 | Freq: Three times a day (TID) | ORAL | Status: DC
  Administered 2014-09-29 – 2014-10-01 (×4): 1 via ORAL

## 2014-09-29 MED ORDER — ASPIRIN EC 81 MG PO TBEC
81.0000 mg | DELAYED_RELEASE_TABLET | Freq: Every day | ORAL | Status: DC
  Administered 2014-09-30 – 2014-10-01 (×2): 81 mg via ORAL
  Filled 2014-09-29 (×2): qty 1

## 2014-09-29 MED ORDER — METOPROLOL TARTRATE 1 MG/ML IV SOLN
5.0000 mg | INTRAVENOUS | Status: DC | PRN
  Administered 2014-10-01: 5 mg via INTRAVENOUS
  Filled 2014-09-29: qty 5

## 2014-09-29 NOTE — H&P (Signed)
[facility] History and Physical                                                                                    [name], is a 81 y.o. male  MRN: [id number]   DOB - 1924/07/02  Admit Date - 10/06/2014  Outpatient Primary MD for the patient is [name] Conch, MD  Referring MD: [name] / ER  With History of -  Past Medical History  Diagnosis Date  . Coronary artery disease 2004  . Stroke   . Benign prostatic hypertrophy   . Colitis 12/16/2011  . Pneumonia 02/03/2013  . DIVERTICULOSIS, COLON 06/17/2006  . Aortic stenosis   . PVD (peripheral vascular disease)     subclavian stent (per Dr. Judd Chambers note)  . Carotid stenosis   . Hypertension   . Renal disorder     Stage III  . CHF (congestive heart failure)   . NSTEMI (non-ST elevated myocardial infarction)       Past Surgical History  Procedure Laterality Date  . Appendectomy    . Coronary artery bypass graft  2004    LIMA-dLAD, SVG-OM1-OM2, SVG-AM  . Hemorrhoid surgery    . Knee arthroscopy    . Elbow surgery    . Cataract extraction      x2  . Carotid endarterectomy Left 08-11-04    cea  . Carotid endarterectomy Right 10-08-04    cea    in for   Chief Complaint  Patient presents with  . Hypotension     HPI This is an 81 year old male patient recently discharged from this facility to a skilled nursing facility on 9/1 after admission for sepsis and UTI. Hospital course was complicated by non-STEMI with peak troponin 84.7 on 8/26 with most recent troponin prior to discharge 25.07 on 8/28. Prior to that admission patient had been living at home alone was driving and was able to manage his home and his cats. He has a history of severe aortic stenosis, ischemic cardiomyopathy with chronic combined systolic and diastolic congestive heart failure with an EF of 40%, BPH, dyslipidemia, hypertension, anemia of chronic disease, peripheral vascular disease, gout and chronic pain syndrome. In review of the nursing facility  records patient has been doing poorly since arrival to the nursing facility. He has refused to eat and has refused care. He has become more more confused and has begun hallucinating. Dr. Lyn Chambers at the nursing facility spoke at length with the patient's daughter who is also the patient's POA regarding the patient's behavior since arrival. The daughter reported that her father would not be happy if he cannot have his own apartment and remain independent. IV fluids were initiated on 9/18. Plans were to also have the patient evaluated by psychiatry and palliative care at the nursing facility. Since arrival to the nursing facility patient has been made a DO NOT RESUSCITATE. Unfortunately patient has continued to decline and hallucinations have become worse prompting evaluation in the emergency department. Patient was found on the ground after a possible fall and there is documentation that patient has been apparently despondent and crying frequently/not eating.  Upon arrival to the ER patient's rectal temperature  was 98.6, BP was 105/61, pulse 76 and regular, respirations 18, O2 saturations 97% on 2 L nasal cannula oxygen. Herbal chest x-ray was concerning for superimposed bibasilar atelectasis or infiltrates and given patient's acute decline in mentation there were concerns that he may have healthcare acquired pneumonia therefore empiric Broad-spectrum antibiotics were initiated while in the emergency department; prior to antibiotic therapy blood cultures have been obtained. Urinalysis and culture is also been obtained. Laboratory data revealed mild hypokalemia potassium 3.3, acute renal failure with BUN 36 and creatinine 2.3 with BUN 22 and creatinine 1.4 near time of discharge last admission. AST slightly elevated at 42 with a total bilirubin 1.3. Troponin was also elevated at 2.5 but this is significantly decreased as compared to previous reading of 25.7 on 8/28. EKG unchanged from previous. Lactic acid was  normal but patient noted to be on oral bicarbonate at nursing facility but unclear if he was actually taking this medication. CBC was within normal limits for this patient and he did not have any leukocytosis. Urinalysis was abnormal and concerning for urinary tract infection.   Review of Systems   **Patient to confused to provide adequate history of present illness therefore all history obtained from electronic medical record and from attending EDP Dr. Rosalia Chambers  Social History Social History  Substance Use Topics  . Smoking status: Former Smoker -- 1.00 packs/day for 40 years    Types: Cigarettes    Quit date: 01/12/1988  . Smokeless tobacco: Never Used  . Alcohol Use: No    Resides at: Skilled nursing facility  Lives with: N/A-daughter [name] [phone]) is POA  Ambulatory status: Has been refusing to ambulate or get out of bed according to nursing staff   Family History Family History  Problem Relation Age of Onset  . Stroke Father   . Diabetes Mother   . Heart attack Brother   . Peripheral vascular disease Brother      Prior to Admission medications   Medication Sig Start Date End Date Taking? Authorizing Provider  allopurinol (ZYLOPRIM) 300 MG tablet TAKE 1 TABLET DAILY 04/15/14  Yes [name] Majestic, MD  aspirin EC 81 MG EC tablet Take 1 tablet (81 mg total) by mouth daily. 02/06/13  Yes [name] A Regalado, MD  cyclobenzaprine (FLEXERIL) 10 MG tablet Take 1 tablet (10 mg total) by mouth 3 (three) times daily as needed for muscle spasms (generalized muscle/joint pain). 09/12/14  Yes [name] A Regalado, MD  finasteride (PROSCAR) 5 MG tablet Take 5 mg by mouth daily.   Yes Historical Provider, MD  furosemide (LASIX) 40 MG tablet TAKE 1 TABLET DAILY 08/30/14  Yes [name] Majestic, MD  isosorbide mononitrate (IMDUR) 30 MG 24 hr tablet Take 0.5 tablets (15 mg total) by mouth daily. 09/12/14  Yes [name] A Regalado, MD  LORazepam (ATIVAN) 0.5 MG tablet TAKE ONE TABLET BY MOUTH  NIGHTLY AT BEDTIME AS NEEDED 06/18/14  Yes [name] Majestic, MD  metoprolol succinate (TOPROL-XL) 25 MG 24 hr tablet Take 0.5 tablets (12.5 mg total) by mouth 2 (two) times daily. 09/12/14  Yes [name] A Regalado, MD  nitroGLYCERIN (NITROSTAT) 0.4 MG SL tablet Place 1 tablet (0.4 mg total) under the tongue every 5 (five) minutes as needed for chest pain. 01/21/14  Yes [name] Majestic, MD  OXYGEN Inhale 2 L into the lungs continuous.   Yes Historical Provider, MD  potassium chloride SA (K-DUR,KLOR-CON) 20 MEQ tablet Take 40 mEq by mouth daily. STARTED 10-24-14, FOR 2 DAYS ENDING  09/30/14   Yes Historical Provider, MD  sodium bicarbonate 650 MG tablet Take 1 tablet (650 mg total) by mouth 2 (two) times daily. 09/12/14  Yes [name] A Regalado, MD  tamsulosin (FLOMAX) 0.4 MG CAPS capsule Take 0.4 mg by mouth daily. 12/20/11  Yes Zannie Cove, MD  traMADol (ULTRAM) 50 MG tablet Take 50 mg by mouth every 6 (six) hours as needed for moderate pain.   Yes Historical Provider, MD  VYTORIN 10-40 MG per tablet TAKE 1 TABLET NIGHTLY AT BEDTIME 04/01/14  Yes [name] Majestic, MD  cephALEXin (KEFLEX) 500 MG capsule Take 1 capsule (500 mg total) by mouth 2 (two) times daily. 09/12/14   Alba Cory, MD    Allergies  Allergen Reactions  . Nsaids Other (See Comments)    REACTION: renal failure    Physical Exam  Vitals  Blood pressure 104/57, pulse 65, temperature 98.6 F (37 C), temperature source Rectal, resp. rate 16, weight 170 lb (77.111 kg), SpO2 96 %.   General:  In no acute physiologic distress, appears chronically ill and malnourished  Psych:  Flat affect, patient very confused. He told me "I've had been exasperating things occur in my life recently" that he reports that it feels like every place he goes "they want to arrest me". He was unable to tell me who they were, also unaware he was residing in a skilled nursing facility although he knew he was not at his home. He later reports that he wasn't sure  why but "I just couldn't stop crying".  Neuro:   No focal neurological deficits, CN II through XII intact, Strength 5/5 all 4 extremities, Sensation intact all 4 extremities.  ENT:  Ears and Eyes appear Normal, Conjunctivae clear, PER. Extremely dry oral mucosa without erythema or exudates. Lips dry and cracked  Neck:  Supple, No lymphadenopathy appreciated  Respiratory:  Symmetrical chest wall movement, Good air movement bilaterally, CTAB. Room Air  Cardiac:  RRR, No Murmurs, no LE edema noted, no JVD, No carotid bruits, peripheral pulses palpable at 2+  Abdomen:  Positive bowel sounds, Soft, Non tender, Non distended,  No masses appreciated, no obvious hepatosplenomegaly  Skin:  No Cyanosis, poor Skin Turgor, No Skin Rash or Bruise.  Extremities: Symmetrical without obvious trauma or injury,  no effusions.  Data Review  CBC  Recent Labs Lab 10/09/2014 1355  WBC 8.6  HGB 11.3*  HCT 36.6*  PLT 300  MCV 99.5  MCH 30.7  MCHC 30.9  RDW 14.3  LYMPHSABS 1.6  MONOABS 0.7  EOSABS 0.4  BASOSABS 0.1    Chemistries   Recent Labs Lab 09/18/2014 1355  NA 143  K 3.3*  CL 101  CO2 30  GLUCOSE 114*  BUN 36*  CREATININE 2.30*  CALCIUM 8.5*  AST 42*  ALT 21  ALKPHOS 73  BILITOT 1.3*    estimated creatinine clearance is 24.2 mL/min (by C-G formula based on Cr of 2.3).  No results for input(s): TSH, T4TOTAL, T3FREE, THYROIDAB in the last 72 hours.  Invalid input(s): FREET3  Coagulation profile No results for input(s): INR, PROTIME in the last 168 hours.  No results for input(s): DDIMER in the last 72 hours.  Cardiac Enzymes No results for input(s): CKMB, TROPONINI, MYOGLOBIN in the last 168 hours.  Invalid input(s): CK  Invalid input(s): POCBNP  Urinalysis    Component Value Date/Time   COLORURINE YELLOW 10/06/2014 1420   APPEARANCEUR TURBID* 10/09/2014 1420   LABSPEC 1.017 09/18/2014 1420  PHURINE 5.5 09/21/2014 1420   GLUCOSEU NEGATIVE 09/25/2014 1420    GLUCOSEU NEGATIVE 06/11/2011 1017   HGBUR LARGE* 09/12/2014 1420   BILIRUBINUR SMALL* 09/14/2014 1420   BILIRUBINUR 1+ 10/18/2011 1409   KETONESUR 15* 09/17/2014 1420   PROTEINUR 100* 09/17/2014 1420   PROTEINUR 1+ 10/18/2011 1409   UROBILINOGEN 1.0 10/11/2014 1420   UROBILINOGEN 0.2 10/18/2011 1409   NITRITE NEGATIVE 10/03/2014 1420   NITRITE n 10/18/2011 1409   LEUKOCYTESUR LARGE* 09/30/2014 1420    Imaging results:   Dg Chest 2 View  09/05/2014   CLINICAL DATA:  Diarrhea and vomiting today, difficulty urinating for 1.5 weeks, history UTIs, coronary artery disease post MI, stroke, pneumonia  EXAM: CHEST  2 VIEW  COMPARISON:  02/03/2013  FINDINGS: Enlargement of cardiac silhouette post CABG.  Slight pulmonary vascular congestion.  Chronic interstitial changes in mid to lower lungs with RIGHT basilar atelectasis and decreased lung volumes.  No definite infiltrate, pleural effusion or pneumothorax.  IMPRESSION: Enlargement of cardiac silhouette post CABG.  Chronic inches disease and RIGHT basilar atelectasis.   Electronically Signed   By: Ulyses Southward M.D.   On: 09/05/2014 15:07   Dg Chest Port 1 View  09/28/2014   CLINICAL DATA:  Weakness, stroke, pneumonia.  EXAM: PORTABLE CHEST - 1 VIEW  COMPARISON:  09/05/2014  FINDINGS: The heart is enlarged but stable. There are chronic lung changes. Bibasilar lung opacity could reflect atelectasis or infiltrate. No pleural effusion or pulmonary edema. Prominent skin fold over the left chest is noted. Stable advanced bilateral shoulder degenerative changes.  IMPRESSION: Cardiac enlargement and chronic lung disease with superimposed bibasilar atelectasis or infiltrates.   Electronically Signed   By: Rudie Meyer M.D.   On: 09/14/2014 14:29     EKG: (Independently reviewed) sinus rhythm with right bundle branch block unchanged from previous EKG, prolonged QTC 554 ms which is new compared to previous QTC 493 ms on 8/26. Although previous EKGs dating back  several years have been 500 ms or slightly higher   Assessment & Plan  Principal Problem:   Acute delirium -Admit to telemetry -Uncertain if etiology secondary to underlying dementia or other undiagnosed psychiatric problem or more reflective of metabolic issues such as acute renal failure and UTI -Certainly exacerbated by apparent situational severe depression -Patient had planned psychiatric evaluation for the skilled nursing facility prior to admission- unless has severe agitated delirium likely would benefit from full treatment of current metabolic/infectious issues in an attempt to return to baseline before attempting formal psychiatric evaluation -Has prolonged QTC so avoid anti-psychotic agents -Had Ativan when necessary available at skilled nursing facility; I have not reordered   Active Problems:   Acute renal failure superimposed on stage 3 chronic kidney disease -Secondary to refusal to eat and drink at skilled nursing facility -Was also prescribed Lasix but unclear if actually taking at nursing facility since has been refusing care; nonetheless this medication is on hold -IV fluid hydration -Follow labs    FTT (failure to thrive) in adult/severe situational depression -Unclear if patient was experiencing depression symptoms prior to last admission but have certainly been documented with these symptoms since arrival to skilled nursing facility -Currently exacerbated by underlying metabolic and infectious issues -Nutrition consultation -Rehydrate as above -Protein supplementation -Daughter will eventually need to address goals of care if patient by upcoming psychiatric evaluation to lack capacity to make decisions regarding his own care    Dehydration, severe -Secondary to failure to thrive -See above  Acute respiratory failure with hypoxia/HCAP  -Minimally hypoxemic and chest x-ray with only bibasilar changes -No apparent cough or fevers at nursing facility -Repeat  chest x-ray in a.m. after hydration since could "fluff out" infiltrate once becomes more euvolemic -Continue empiric antibiotics -Follow up on blood cultures -Continue supportive care    Severe aortic stenosis/ICM/  Chronic combined systolic and diastolic congestive heart failure -EF 40% and patient actually volume depleted therefore compensated from a heart failure standpoint -Patient profoundly dehydrated and in setting of severe aortic stenosis at high risk for syncopal event with standing and this likely explains patient being found on the floor at nursing facility -Holding diuretic and long-acting Imdur until more euvolemic; blood pressure suboptimal -We'll also hold long-acting beta blocker but provide when necessary Lopressor in the event develops breakthrough tachycardia    UTI  -Urinalysis highly abnormal and concerning for UTI -Empiric antibiotics as above -Follow-up on urine culture -Abnormal urinalysis could also be explained by severe dehydration      QT prolongation -A recurrent issue although typically only just above 500 ms -Keep potassium greater than 4 and magnesium at least 2 therefore check magnesium level today -Avoid offending medications such as fluoroquinolones, and to psychotic such as Haldol, SSRIs such as trazodone, and anti-medics such as Zofran -Repeat EKG in a.m.   Hypokalemia -Potassium to maintenance fluid -Oral repletion    Elevated troponin -Patient had peak troponin 84.7 during last admission and when last checked on 8/26 troponin down to 25 range so suspect current reading just above to an expected finding -EKG unchanged -Patient without any reported symptomatology when asked but is also poor historian -As precaution cycle troponin but given current physiologic status obviously not an appropriate candidate for aggressive cardiovascular evaluation or treatment -Beta blocker on hold secondary to suboptimal blood pressures; continue baby aspirin;     Hyperlipidemia -Statin on hold until CK returned since patient presenting with significant dehydration    Anemia -hgb stable and at baseline of 11.3    Essential hypertension -Pressure suboptimal-medications on hold as above    BPH  -Continue Flomax to avoid urinary retention    PVD -Has not been ambulating in no apparent issues with claudication    DVT Prophylaxis: Lovenox renal dose adjusted for low GFR  Family Communication:   No family at bedside at time of admission  Code Status:  DO NOT RESUSCITATE  Condition:  Guarded  Discharge disposition:  Anticipate return back to skilled nursing facility in the next 48-72 hours once acute delirium improved and metabolic derangement/infections adequately treated and patient otherwise medically stable  Time spent in minutes : 60      ELLIS,ALLISON L. ANP on 10/05/2014 at 4:46 PM  Between 7am to 7pm - Pager - [phone]  After 7pm go to [email or web address] - password [email or web address]  And look for the night coverage person covering me after hours  [facility]

## 2014-09-29 NOTE — Assessment & Plan Note (Addendum)
Pt's lasix held for the time being while po intake is reduced. BMP ordered. I spoke with pt's daughter, POA, at length. Prior to hospitalization pt was living alone with his cats and driving his car, picked her up from work on Thursdays to go to lunch. Since at SNF pt has been refusing food and help, although daughter says he drinks his Pepsi,and so its not possible to tell whether he could progress or this is his new baseline. Daughter has an usual social circumstance, so he can't come to her house and he can't go back to his house. Per daughter pt has very rigid beliefs for years and has alienated the rest of the family. Pt is alone in making decisions. I discussed that these beliefs may get in out way now of his getting better/getting help.. Pt was OK with IVF , she's knows that's just temporary, and she is not opposed to Psych consult. She said her father would't be happy if he could not have his apartment, independence and cats. IV fluids have been ordered, Psych consulted and Palliative care was consulted also.

## 2014-09-29 NOTE — Progress Notes (Signed)
ANTIBIOTIC CONSULT NOTE - FOLLOW UP  Pharmacy Consult for Vancomycin and Zosyn Indication: rule out sepsis  Allergies  Allergen Reactions  . Nsaids Other (See Comments)    REACTION: renal failure    Patient Measurements: Weight: 170 lb (77.111 kg)  Vital Signs: Temp: 98.6 F (37 C) (09/18 1343) Temp Source: Rectal (09/18 1343) BP: 102/60 mmHg (09/18 1515) Pulse Rate: 70 (09/18 1515) Intake/Output from previous day:   Intake/Output from this shift:    Labs:  Recent Labs  09/19/2014 1355  WBC 8.6  HGB 11.3*  PLT 300  CREATININE 2.30*   Estimated Creatinine Clearance: 24.2 mL/min (by C-G formula based on Cr of 2.3). No results for input(s): VANCOTROUGH, VANCOPEAK, VANCORANDOM, GENTTROUGH, GENTPEAK, GENTRANDOM, TOBRATROUGH, TOBRAPEAK, TOBRARND, AMIKACINPEAK, AMIKACINTROU, AMIKACIN in the last 72 hours.   Microbiology: Recent Results (from the past 720 hour(s))  Blood culture (routine x 2)     Status: None   Collection Time: 09/05/14  1:38 PM  Result Value Ref Range Status   Specimen Description BLOOD RIGHT FOREARM  Final   Special Requests BOTTLES DRAWN AEROBIC AND ANAEROBIC  Final   Culture   Final    NO GROWTH 5 DAYS Performed at [facility]    Report Status 09/10/2014 FINAL  Final  Blood culture (routine x 2)     Status: None   Collection Time: 09/05/14  1:46 PM  Result Value Ref Range Status   Specimen Description BLOOD RIGHT ANTECUBITAL  Final   Special Requests BOTTLES DRAWN AEROBIC AND ANAEROBIC  Final   Culture   Final    NO GROWTH 5 DAYS Performed at [facility]    Report Status 09/10/2014 FINAL  Final  Urine culture     Status: None   Collection Time: 09/05/14  2:42 PM  Result Value Ref Range Status   Specimen Description URINE, CATHETERIZED  Final   Special Requests NONE  Final   Culture   Final    NO GROWTH 1 DAY Performed at [facility]    Report Status 09/06/2014 FINAL  Final  Stool culture     Status: None    Collection Time: 09/05/14  4:12 PM  Result Value Ref Range Status   Specimen Description STOOL  Final   Special Requests Normal  Final   Culture   Final    NO SALMONELLA, SHIGELLA, CAMPYLOBACTER, YERSINIA, OR E.COLI [id number] ISOLATED Performed at [facility]    Report Status 09/09/2014 FINAL  Final  C difficile quick scan w PCR reflex     Status: None   Collection Time: 09/05/14  4:12 PM  Result Value Ref Range Status   C Diff antigen NEGATIVE NEGATIVE Final   C Diff toxin NEGATIVE NEGATIVE Final   C Diff interpretation Negative for toxigenic C. difficile  Final  MRSA PCR Screening     Status: None   Collection Time: 09/05/14  8:59 PM  Result Value Ref Range Status   MRSA by PCR NEGATIVE NEGATIVE Final    Comment:        The GeneXpert MRSA Assay (FDA approved for NASAL specimens only), is one component of a comprehensive MRSA colonization surveillance program. It is not intended to diagnose MRSA infection nor to guide or monitor treatment for MRSA infections.     Anti-infectives    Start     Dose/Rate Route Frequency Ordered Stop   10/06/2014 1500  vancomycin (VANCOCIN) IVPB 1000 mg/200 mL premix  1,000 mg 200 mL/hr over 60 Minutes Intravenous  Once 04-Oct-2014 1458     10-04-2014 1500  piperacillin-tazobactam (ZOSYN) IVPB 3.375 g     3.375 g 12.5 mL/hr over 240 Minutes Intravenous  Once 10-04-2014 1458        Assessment: 81 year old male admitted with sepsis and acute on chronic renal failure.  To continue antibiotic therapy with Vancomycin and Zosyn.  Goal of Therapy:  Vancomycin trough level 15-20 mcg/ml  Plan:  Zosyn 3.375gm IV q8h extended infusion Vancomycin 1g IV q48h Watch renal function closely Follow available micro data  Estella Husk, Pharm.D., BCPS, AAHIVP Clinical Pharmacist Phone: [phone] or [phone] 10-04-2014, 3:41 PM

## 2014-09-29 NOTE — ED Notes (Signed)
Hospitalist at bedside 

## 2014-09-29 NOTE — ED Notes (Signed)
Spoke with daughter, Cala Bradford. Daughter states this patient has been having hallucinations since last discharge. Pt supposed to have psych consult tomorrow due to depression, hallucinations, and refusing to eat.

## 2014-09-29 NOTE — ED Provider Notes (Signed)
CSN: [id number]     Arrival date & time 09/15/2014  1322 History   First MD Initiated Contact with Patient 09/28/2014 1326     Chief Complaint  Patient presents with  . Hypotension    Level V caveat secondary to confusion and patient's amnesia for event. (Consider location/radiation/quality/duration/timing/severity/associated sxs/prior Treatment) HPI  81 year old male history of severe aortic stenosis, coronary artery disease, status post CABG, stage III chronic kidney disease who was admitted with a diagnosis of sepsis admitted 825 and discharged to nursing home on September 1. During that admission he had an and STEMI and elevated creatinine. He was discharged to nursing home which was a new living situation for him. Note from the nursing home on September 15 by Dr. Lyn Hollingshead stated that she was asked to see the CT had been crying and not eating. Today EMS was called to report the patient was found on the ground after a possible fall. There is no report that this was witnessed and is unclear. Patient is tearful and not able to give me the history around this. He denies current pain however. Discharge Diagnoses:     Sepsis   Essential hypertension   UTI   CKD (chronic kidney disease), stage III   NSTEMI (non-ST elevated myocardial infarction)   Severe aortic stenosis   PVD (peripheral vascular disease)   BRBPR (bright red blood per rectum)   UTI (lower urinary tract infection)   AKI (acute kidney injury)  Past Medical History  Diagnosis Date  . Coronary artery disease 2004  . Stroke   . Benign prostatic hypertrophy   . Colitis 12/16/2011  . Pneumonia 02/03/2013  . DIVERTICULOSIS, COLON 06/17/2006  . Aortic stenosis   . PVD (peripheral vascular disease)     subclavian stent (per Dr. Judd Gaudier note)  . Carotid stenosis    Past Surgical History  Procedure Laterality Date  . Appendectomy    . Coronary artery bypass graft  2004    LIMA-dLAD, SVG-OM1-OM2, SVG-AM  . Hemorrhoid surgery    .  Knee arthroscopy    . Elbow surgery    . Cataract extraction      x2  . Carotid endarterectomy Left 08-11-04    cea  . Carotid endarterectomy Right 10-08-04    cea   Family History  Problem Relation Age of Onset  . Stroke Father   . Diabetes Mother   . Heart attack Brother   . Peripheral vascular disease Brother    Social History  Substance Use Topics  . Smoking status: Former Smoker -- 1.00 packs/day for 40 years    Types: Cigarettes    Quit date: 01/12/1988  . Smokeless tobacco: Never Used  . Alcohol Use: No    Review of Systems  Unable to perform ROS     Allergies  Nsaids  Home Medications   Prior to Admission medications   Medication Sig Start Date End Date Taking? Authorizing Pixie Burgener  allopurinol (ZYLOPRIM) 300 MG tablet TAKE 1 TABLET DAILY Patient taking differently: Take 1 tablet daily. 04/15/14   Shelva Majestic, MD  aspirin EC 81 MG EC tablet Take 1 tablet (81 mg total) by mouth daily. 02/06/13   Belkys A Regalado, MD  cephALEXin (KEFLEX) 500 MG capsule Take 1 capsule (500 mg total) by mouth 2 (two) times daily. 09/12/14   Belkys A Regalado, MD  cyclobenzaprine (FLEXERIL) 10 MG tablet Take 1 tablet (10 mg total) by mouth 3 (three) times daily as needed for muscle spasms (generalized muscle/joint  pain). 09/12/14   Belkys A Regalado, MD  finasteride (PROSCAR) 5 MG tablet Take 5 mg by mouth daily.    Historical Vivia Rosenburg, MD  furosemide (LASIX) 40 MG tablet TAKE 1 TABLET DAILY Patient taking differently: Take 1 tablet daily. 08/30/14   Shelva Majestic, MD  isosorbide mononitrate (IMDUR) 30 MG 24 hr tablet Take 0.5 tablets (15 mg total) by mouth daily. 09/12/14   Belkys A Regalado, MD  LORazepam (ATIVAN) 0.5 MG tablet TAKE ONE TABLET BY MOUTH NIGHTLY AT BEDTIME AS NEEDED Patient taking differently: Take 1 tablet by mouth nightly at bedtime as needed for anxiety or sleep. 06/18/14   Shelva Majestic, MD  metoprolol succinate (TOPROL-XL) 25 MG 24 hr tablet Take 0.5 tablets  (12.5 mg total) by mouth 2 (two) times daily. 09/12/14   Belkys A Regalado, MD  nitroGLYCERIN (NITROSTAT) 0.4 MG SL tablet Place 1 tablet (0.4 mg total) under the tongue every 5 (five) minutes as needed for chest pain. 01/21/14   Shelva Majestic, MD  sodium bicarbonate 650 MG tablet Take 1 tablet (650 mg total) by mouth 2 (two) times daily. 09/12/14   Belkys A Regalado, MD  tamsulosin (FLOMAX) 0.4 MG CAPS capsule Take 0.4 mg by mouth daily. 12/20/11   Zannie Cove, MD  VYTORIN 10-40 MG per tablet TAKE 1 TABLET NIGHTLY AT BEDTIME Patient taking differently: Take 1 tablet nightly at bedtime. 04/01/14   Shelva Majestic, MD   BP 103/44 mmHg  Pulse 75  Temp(Src) 98.6 F (37 C) (Rectal)  Resp 18  Wt 170 lb (77.111 kg)  SpO2 97% Physical Exam  Constitutional: He is oriented to person, place, and time.  Elderly chronically ill-appearing male with dry mucous membranes  HENT:  Head: Normocephalic and atraumatic.  Right Ear: External ear normal.  Left Ear: External ear normal.  Eyes:  Eyes are somewhat watery and  Neck: Normal range of motion. Neck supple.  Cardiovascular: Normal rate and regular rhythm.   Murmur heard.  Systolic murmur is present with a grade of 5/6  Pulmonary/Chest: No respiratory distress. He has no wheezes. He has no rales.  Abdominal: Soft. Bowel sounds are normal.  Musculoskeletal: Normal range of motion. He exhibits no edema.  Neurological: He is alert and oriented to person, place, and time.  Skin: Skin is warm. There is erythema.  Moderate perirectal erythema is nontender  Psychiatric:  Patient is tearful she is tearful  Nursing note and vitals reviewed.   ED Course  Procedures (including critical care time) Labs Review Labs Reviewed  COMPREHENSIVE METABOLIC PANEL - Abnormal; Notable for the following:    Potassium 3.3 (*)    Glucose, Bld 114 (*)    BUN 36 (*)    Creatinine, Ser 2.30 (*)    Calcium 8.5 (*)    Total Protein 6.2 (*)    Albumin 2.9 (*)     AST 42 (*)    Total Bilirubin 1.3 (*)    GFR calc non Af Amer 24 (*)    GFR calc Af Amer 28 (*)    All other components within normal limits  CBC WITH DIFFERENTIAL/PLATELET - Abnormal; Notable for the following:    RBC 3.68 (*)    Hemoglobin 11.3 (*)    HCT 36.6 (*)    All other components within normal limits  URINALYSIS, ROUTINE W REFLEX MICROSCOPIC (NOT AT [facility]) - Abnormal; Notable for the following:    APPearance TURBID (*)    Hgb urine dipstick LARGE (*)  Bilirubin Urine SMALL (*)    Ketones, ur 15 (*)    Protein, ur 100 (*)    Leukocytes, UA LARGE (*)    All other components within normal limits  URINE MICROSCOPIC-ADD ON - Abnormal; Notable for the following:    Bacteria, UA FEW (*)    All other components within normal limits  I-STAT TROPOININ, ED - Abnormal; Notable for the following:    Troponin i, poc 2.05 (*)    All other components within normal limits  CULTURE, BLOOD (ROUTINE X 2)  CULTURE, BLOOD (ROUTINE X 2)  URINE CULTURE  I-STAT CG4 LACTIC ACID, ED    Imaging Review Dg Chest Port 1 View  2014-10-10   CLINICAL DATA:  Weakness, stroke, pneumonia.  EXAM: PORTABLE CHEST - 1 VIEW  COMPARISON:  09/05/2014  FINDINGS: The heart is enlarged but stable. There are chronic lung changes. Bibasilar lung opacity could reflect atelectasis or infiltrate. No pleural effusion or pulmonary edema. Prominent skin fold over the left chest is noted. Stable advanced bilateral shoulder degenerative changes.  IMPRESSION: Cardiac enlargement and chronic lung disease with superimposed bibasilar atelectasis or infiltrates.   Electronically Signed   By: Rudie Meyer M.D.   On: 2014-10-10 14:29   I have personally reviewed and evaluated these images and lab results as part of my medical decision-making.   EKG Interpretation   Date/Time:  Sunday 10-Oct-2014 14:09:34 EDT Ventricular Rate:  68 PR Interval:  234 QRS Duration: 175 QT Interval:  521 QTC Calculation: 554 R Axis:    -6 Text Interpretation:  Normal sinus rhythm ST depression in v1-v3 with  increased to wave inversion compared to first prior ekg of 06 September 2014  Confirmed by Rosalia Hammers MD, Duwayne Heck [phone]) on 10/10/2014 2:29:03 PM      MDM   Final diagnoses:  None    1- confusion- patient tearful andappears confused.   2- nstemi- patient with nstemi during last admission.  EKg today as above.  Initial troponin elevated at 2/05.  3-- possible pneumonia- patient with bibasilar atelectasis vs infection- given patient's presentation plan treatment for hcap. 4- depression  Discussed with Junious Silk on for hospitalist and plan admission to telemetry.     Margarita Grizzle, MD Oct 10, 2014 [phone]

## 2014-09-29 NOTE — ED Notes (Addendum)
Pt here via [facility] from [facility] for hypotension and unwitnessed fall (per EMS pt ao x 4).  Staff stated they came into room and found pt on floor.  Pt stated he slipped off bed and denied loc.  They took repeated bp's sbp of 80's.  EMS stated bp's of 88/50.  CBG 73.  HR 64. RR 18.  SP O2 98 on 2L.  Pt is crying and saying he wants to go home.

## 2014-09-29 NOTE — Progress Notes (Signed)
Pt arrived to unit, VSS, no complaints of pain, pt oriented to room, and call bell in place. Will continue to monitor closely.   Claudie Revering, RN Sandrea Hammond, RN

## 2014-09-29 NOTE — ED Notes (Signed)
Ray, MD is aware of troponin result. No new orders at this time

## 2014-09-30 ENCOUNTER — Encounter (HOSPITAL_COMMUNITY): Payer: Self-pay

## 2014-09-30 ENCOUNTER — Inpatient Hospital Stay (HOSPITAL_COMMUNITY): Payer: Medicare Other

## 2014-09-30 LAB — COMPREHENSIVE METABOLIC PANEL
ALBUMIN: 2.7 g/dL — AB (ref 3.5–5.0)
ALT: 20 U/L (ref 17–63)
ANION GAP: 9 (ref 5–15)
AST: 38 U/L (ref 15–41)
Alkaline Phosphatase: 68 U/L (ref 38–126)
BUN: 28 mg/dL — ABNORMAL HIGH (ref 6–20)
CO2: 28 mmol/L (ref 22–32)
Calcium: 8.3 mg/dL — ABNORMAL LOW (ref 8.9–10.3)
Chloride: 110 mmol/L (ref 101–111)
Creatinine, Ser: 1.99 mg/dL — ABNORMAL HIGH (ref 0.61–1.24)
GFR calc Af Amer: 33 mL/min — ABNORMAL LOW (ref >60)
GFR calc non Af Amer: 28 mL/min — ABNORMAL LOW (ref >60)
GLUCOSE: 145 mg/dL — AB (ref 65–99)
POTASSIUM: 3.7 mmol/L (ref 3.5–5.1)
SODIUM: 147 mmol/L — AB (ref 135–145)
TOTAL PROTEIN: 6 g/dL — AB (ref 6.5–8.1)
Total Bilirubin: 1.2 mg/dL (ref 0.3–1.2)

## 2014-09-30 LAB — URINE CULTURE: CULTURE: NO GROWTH

## 2014-09-30 LAB — CBC WITH DIFFERENTIAL/PLATELET
BASOS ABS: 0 10*3/uL (ref 0.0–0.1)
Basophils Relative: 1 %
Eosinophils Absolute: 0.4 10*3/uL (ref 0.0–0.7)
Eosinophils Relative: 5 %
HEMATOCRIT: 36.6 % — AB (ref 39.0–52.0)
Hemoglobin: 11.1 g/dL — ABNORMAL LOW (ref 13.0–17.0)
LYMPHS PCT: 18 %
Lymphs Abs: 1.3 10*3/uL (ref 0.7–4.0)
MCH: 30.5 pg (ref 26.0–34.0)
MCHC: 30.3 g/dL (ref 30.0–36.0)
MCV: 100.5 fL — AB (ref 78.0–100.0)
MONO ABS: 0.6 10*3/uL (ref 0.1–1.0)
MONOS PCT: 9 %
NEUTROS ABS: 5 10*3/uL (ref 1.7–7.7)
Neutrophils Relative %: 67 %
Platelets: 248 10*3/uL (ref 150–400)
RBC: 3.64 MIL/uL — ABNORMAL LOW (ref 4.22–5.81)
RDW: 14.4 % (ref 11.5–15.5)
WBC: 7.4 10*3/uL (ref 4.0–10.5)

## 2014-09-30 LAB — TROPONIN I
TROPONIN I: 1.68 ng/mL — AB (ref <0.031)
Troponin I: 2.04 ng/mL (ref <0.031)

## 2014-09-30 MED ORDER — LORAZEPAM 2 MG/ML IJ SOLN
0.5000 mg | Freq: Once | INTRAMUSCULAR | Status: AC
  Administered 2014-09-30: 0.5 mg via INTRAVENOUS
  Filled 2014-09-30: qty 1

## 2014-09-30 MED ORDER — MAGNESIUM OXIDE 400 (241.3 MG) MG PO TABS
200.0000 mg | ORAL_TABLET | Freq: Two times a day (BID) | ORAL | Status: DC
  Administered 2014-09-30 – 2014-10-01 (×4): 200 mg via ORAL
  Filled 2014-09-30 (×4): qty 1

## 2014-09-30 NOTE — Evaluation (Signed)
Physical Therapy Evaluation Patient Details Name: [name] MRN: [id number] DOB: 1924-07-05 Today's Date: 09/30/2014   History of Present Illness  81 yo male with fragile cardiac status, HCAP, UTI, sepsis was admitted.  Hx: CVA, PVD, CHF, UTI  Clinical Impression  Pt was seen for assessment of his physical functional level and will be returning to SNF.  He is able to sidestep up bed but is completely exhausted by the effort.  Pt repositioned on his side with pillows to protect skin and was noted to have fallen asleep quickly when PT done.    Follow Up Recommendations SNF ([facility])    Equipment Recommendations  None recommended by PT    Recommendations for Other Services OT consult     Precautions / Restrictions Precautions Precautions: Fall Restrictions Weight Bearing Restrictions: No      Mobility  Bed Mobility Overal bed mobility: Needs Assistance Bed Mobility: Sidelying to Sit;Sit to Sidelying;Rolling Rolling: Mod assist Sidelying to sit: Mod assist     Sit to sidelying: Mod assist General bed mobility comments: assist to sequence and control HOB, then to lift trunk off bedand cue legs off bed  Transfers Overall transfer level: Needs assistance Equipment used: 2 person hand held assist Transfers: Sit to/from Stand Sit to Stand: Mod assist;+2 physical assistance;+2 safety/equipment         General transfer comment: cued sequence and reminders for hand placement,then sidestepstoset up for sitting at top of bed  Ambulation/Gait         Gait velocity: reduced Gait velocity interpretation: Below normal speed for age/gender General Gait Details: takes steps only to transition to [facility]  Stairs            Wheelchair Mobility    Modified Rankin (Stroke Patients Only)       Balance Overall balance assessment: Needs assistance Sitting-balance support: Feet supported Sitting balance-Leahy Scale: Fair   Postural control: Posterior lean Standing balance  support: Bilateral upper extremity supported Standing balance-Leahy Scale: Poor Standing balance comment: 2 person assist to control standign                             Pertinent Vitals/Pain Pain Assessment: No/denies pain    Home Living Family/patient expects to be discharged to:: Skilled nursing facility Living Arrangements: Other (Comment) (SNF)             Home Equipment: Other (comment) (all needed equipment available)      Prior Function Level of Independence: Needs assistance   Gait / Transfers Assistance Needed: unable to obtain accurate history  ADL's / Homemaking Assistance Needed: SNF care to handle the details        Hand Dominance        Extremity/Trunk Assessment   Upper Extremity Assessment: Generalized weakness           Lower Extremity Assessment: Generalized weakness      Cervical / Trunk Assessment: Kyphotic  Communication   Communication: Expressive difficulties;Other (comment) (speech is not clear)  Cognition Arousal/Alertness: Awake/alert Behavior During Therapy: Impulsive;Restless Overall Cognitive Status: History of cognitive impairments - at baseline                      General Comments General comments (skin integrity, edema, etc.): Pt is from SNF and looks fragile, mult areas of skin tears and breakdown on LE's    Exercises        Assessment/Plan  PT Assessment Patient needs continued PT services  PT Diagnosis Generalized weakness   PT Problem List Decreased strength;Decreased range of motion;Decreased activity tolerance;Decreased balance;Decreased mobility;Decreased coordination;Decreased cognition;Decreased knowledge of use of DME;Decreased safety awareness;Decreased knowledge of precautions;Cardiopulmonary status limiting activity;Decreased skin integrity  PT Treatment Interventions DME instruction;Gait training;Functional mobility training;Therapeutic activities;Therapeutic exercise;Balance  training;Neuromuscular re-education;Patient/family education   PT Goals (Current goals can be found in the Care Plan section) Acute Rehab PT Goals Patient Stated Goal: none stated PT Goal Formulation: Patient unable to participate in goal setting Time For Goal Achievement: 10/14/14 Potential to Achieve Goals: Good    Frequency Min 3X/week   Barriers to discharge Other (comment) (lives in SNF) None x medical concerns    Co-evaluation               End of Session Equipment Utilized During Treatment: Oxygen Activity Tolerance: Patient limited by fatigue Patient left: in bed;with call bell/phone within reach;with bed alarm set Nurse Communication: Mobility status         Time: 1610-9604 PT Time Calculation (min) (ACUTE ONLY): 25 min   Charges:   PT Evaluation $Initial PT Evaluation Tier I: 1 Procedure PT Treatments $Therapeutic Activity: 8-22 mins   PT G Codes:        Ivar Drape 10/21/14, 1:50 PM   Samul Dada, PT MS Acute Rehab Dept. Number: [phone] [id number] and [facility] [phone]

## 2014-09-30 NOTE — Progress Notes (Addendum)
Pt increasingly agitated and attempting to get out of bed, MD paged, new orders given, floor mats placed. Will continue to monitor closely.   Claudie Revering, RN

## 2014-09-30 NOTE — Progress Notes (Signed)
[facility] PROGRESS NOTE  [name] [name]:[id number] DOB: 10-10-1924 DOA: 09/28/2014 PCP: Tana Conch, MD  Assessment/Plan: Principal Problem:   Acute delirium/metabolic encephalopathy - Most likely secondary to infectious etiology     Acute respiratory failure with hypoxia/HCAP (healthcare-associated pneumonia) - Currently patient on broad-spectrum antibiotics regimen. We'll continue - Continue supportive therapy  Active Problems: Bacteremia - One out of 2 bottles growing gram-positive cocci. Patient currently on vancomycin and Zosyn. We'll continue current antibiotics regimen. Latency to see if this is contamination.  UTI - Urinalysis suspicious for UTI. But urine culture reports no growth - We will continue patient on broad-spectrum antibiotics at this point.  Elevated troponin -Patient is not candidate for aggressive cardiology workup. We'll continue medical management - Troponin trending down on last check - Continue aspirin    QT prolongation - We'll avoid any medications that prolong QT interval    Dehydration, severe - Improving after IV fluid rehydration   Code Status: DO NOT RESUSCITATE Family Communication: No family at bedside  Disposition Plan: Pending continued improvement in condition   Consultants:  None  Procedures:  None  Antibiotics:  Vancomycin and Zosyn  HPI/Subjective: Patient has no new complaints. No acute issues overnight  Objective: Filed Vitals:   09/30/14 1334  BP: 96/59  Pulse: 92  Temp: 97.8 F (36.6 C)  Resp: 17    Intake/Output Summary (Last 24 hours) at 09/30/14 1633 Last data filed at 09/30/14 1300  Gross per 24 hour  Intake   1720 ml  Output    250 ml  Net   1470 ml   Filed Weights   09/25/2014 1403  Weight: 77.111 kg (170 lb)    Exam:   General:  Patient in no acute distress, alert and awake  Cardiovascular: Regular rate and rhythm, no murmurs or rubs  Respiratory: No wheezes, equal chest  rise, mild rales  Abdomen: Soft, nondistended, no guarding  Musculoskeletal: Equal tone  Data Reviewed: Basic Metabolic Panel:  Recent Labs Lab  1355 10/08/2014 2014 09/30/14 0819  NA 143  --  147*  K 3.3*  --  3.7  CL 101  --  110  CO2 30  --  28  GLUCOSE 114*  --  145*  BUN 36*  --  28*  CREATININE 2.30*  --  1.99*  CALCIUM 8.5*  --  8.3*  MG  --  1.7  --    Liver Function Tests:  Recent Labs Lab 10/10/2014 1355 09/30/14 0819  AST 42* 38  ALT 21 20  ALKPHOS 73 68  BILITOT 1.3* 1.2  PROT 6.2* 6.0*  ALBUMIN 2.9* 2.7*   No results for input(s): LIPASE, AMYLASE in the last 168 hours. No results for input(s): AMMONIA in the last 168 hours. CBC:  Recent Labs Lab 10/06/2014 1355 09/30/14 0819  WBC 8.6 7.4  NEUTROABS 5.8 5.0  HGB 11.3* 11.1*  HCT 36.6* 36.6*  MCV 99.5 100.5*  PLT 300 248   Cardiac Enzymes:  Recent Labs Lab  1744 10/07/2014 2014 09/30/14 0200 09/30/14 0819  CKTOTAL 208  --   --   --   TROPONINI  --  2.06* 2.04* 1.68*   BNP (last 3 results) No results for input(s): BNP in the last 8760 hours.  ProBNP (last 3 results) No results for input(s): PROBNP in the last 8760 hours.  CBG: No results for input(s): GLUCAP in the last 168 hours.  Recent Results (from the past 240 hour(s))  Blood Culture (routine x 2)  Status: None (Preliminary result)   Collection Time: 09/22/2014  1:55 PM  Result Value Ref Range Status   Specimen Description BLOOD LEFT ANTECUBITAL  Final   Special Requests BOTTLES DRAWN AEROBIC AND ANAEROBIC  Final   Culture  Setup Time   Final    GRAM POSITIVE COCCI IN CLUSTERS AEROBIC BOTTLE ONLY CRITICAL RESULT CALLED TO, READ BACK BY AND VERIFIED WITH: K WEST 09/30/14 @ 1248 M VESTAL    Culture GRAM POSITIVE COCCI  Final   Report Status PENDING  Incomplete  Urine culture     Status: None   Collection Time: 09/12/2014  2:20 PM  Result Value Ref Range Status   Specimen Description URINE, CATHETERIZED   Final   Special Requests NONE  Final   Culture NO GROWTH 1 DAY  Final   Report Status 09/30/2014 FINAL  Final  Blood Culture (routine x 2)     Status: None (Preliminary result)   Collection Time: 09/26/2014  2:38 PM  Result Value Ref Range Status   Specimen Description BLOOD RIGHT ANTECUBITAL  Final   Special Requests BOTTLES DRAWN AEROBIC AND ANAEROBIC  Final   Culture NO GROWTH < 24 HOURS  Final   Report Status PENDING  Incomplete     Studies: Dg Chest 2 View  09/30/2014   CLINICAL DATA:  Subsequent encounter for healthcare associated pneumonia.  EXAM: CHEST  2 VIEW  COMPARISON:  10/07/2014.  FINDINGS: Two-view exam shows interval increase in interstitial opacity with a basilar predominance. Bibasilar atelectasis or infection has also progressed in the interval. The cardio pericardial silhouette is enlarged. Bones are diffusely demineralized. Degenerative changes are noted in both shoulders. Patient is status post CABG. Telemetry leads overlie the chest.  IMPRESSION: Cardiomegaly with increasing interstitial opacity suggesting progressive pulmonary edema.  Worsening bibasilar atelectasis or pneumonia.   Electronically Signed   By: Kennith Center M.D.   On: 09/30/2014 07:52   Dg Chest Port 1 View  09/14/2014   CLINICAL DATA:  Weakness, stroke, pneumonia.  EXAM: PORTABLE CHEST - 1 VIEW  COMPARISON:  09/05/2014  FINDINGS: The heart is enlarged but stable. There are chronic lung changes. Bibasilar lung opacity could reflect atelectasis or infiltrate. No pleural effusion or pulmonary edema. Prominent skin fold over the left chest is noted. Stable advanced bilateral shoulder degenerative changes.  IMPRESSION: Cardiac enlargement and chronic lung disease with superimposed bibasilar atelectasis or infiltrates.   Electronically Signed   By: Rudie Meyer M.D.   On: 09/30/2014 14:29    Scheduled Meds: . aspirin EC  81 mg Oral Daily  . enoxaparin (LOVENOX) injection  30 mg Subcutaneous Q24H  .  feeding supplement  1 Container Oral TID BM  . feeding supplement (ENSURE ENLIVE)  237 mL Oral BID BM  . finasteride  5 mg Oral Daily  . magnesium oxide  200 mg Oral BID  . piperacillin-tazobactam (ZOSYN)  IV  3.375 g Intravenous 3 times per day  . tamsulosin  0.4 mg Oral Daily  . [START ON 10-07-14] vancomycin  1,000 mg Intravenous Q48H   Continuous Infusions: . dextrose 5 % and 0.9 % NaCl with KCl 20 mEq/L 100 mL/hr at 09/30/14 1406     Time spent: > 35 minutes    Penny Pia  [facility] Pager [phone] If 7PM-7AM, please contact night-coverage at [email or web address], password [facility] 09/30/2014, 4:33 PM  LOS: 1 day

## 2014-09-30 NOTE — Progress Notes (Signed)
Initial Nutrition Assessment  DOCUMENTATION CODES:   Not applicable  INTERVENTION:   Boost Breeze po TID, each supplement provides 250 kcal and 9 grams of protein  Ensure Enlive po BID, each supplement provides 350 kcal and 20 grams of protein  NUTRITION DIAGNOSIS:   Inadequate oral intake related to poor appetite as evidenced by per patient/family report  GOAL:   Patient will meet greater than or equal to 90% of their needs  MONITOR:   PO intake, Supplement acceptance, Labs, Weight trends, I & O's  REASON FOR ASSESSMENT:   Consult Assessment of nutrition requirement/status  ASSESSMENT:   81 year old Male with h/o PVD, cad, chronic systolic and diastolic heart failure, hypertension, carotid stenosis, h/o CVA, a resident of nursing home, recently discharged after being treated for NSTEMI , sepsis and UTI. He presents today with a fall, and worsening confusion and hallucinations. His CXR showed superimposed atelectasis and infiltrates and UA was concerning for urinary tract infection.  RD spoke with pt's daughter at bedside.  Reports pt hasn't eaten well in the past 3 weeks.   States he would "graze" throughout the day.  She also reveals pt's weight has been declining for the past few years (unable to quantify amount).  Pt drinking Boost Breeze oral nutrition supplement upon visit.  Also has Ensure Enlive ordered.  Nutrition focused physical exam completed.  No muscle or subcutaneous fat depletion noticed.  Diet Order:  Diet regular Room service appropriate?: Yes; Fluid consistency:: Thin  Skin:  Reviewed, no issues  Last BM:  N/A  Height:   Ht Readings from Last 1 Encounters:  09/05/14 6' (1.829 m)    Weight:   Wt Readings from Last 1 Encounters:  10/15/2014 170 lb (77.111 kg)    Ideal Body Weight:  81 kg  BMI:  Body mass index is 23.05 kg/(m^2).  Estimated Nutritional Needs:   Kcal:  1800-2000  Protein:  90-100 gm  Fluid:  1.8-2.0 L  EDUCATION NEEDS:    No education needs identified at this time  Maureen Chatters, RD, LDN Pager #: [phone] After-Hours Pager #: [phone]

## 2014-09-30 NOTE — Progress Notes (Signed)
CRITICAL VALUE ALERT  Critical value received:  + Blood Culture  Date of notification:  09/30/2014  Time of notification:  1250  Critical value read back:Yes.    Nurse who received alert:  Charlane Ferretti, RN  MD notified (1st page):  Dr. Cena Benton   Time of first page:  1250  MD notified (2nd page):  Time of second page:  Responding MD:  Dr. Cena Benton  Time MD responded:  1255

## 2014-09-30 NOTE — Care Management Note (Signed)
Case Management Note Donn Pierini RN, BSN Unit 2W-Case Manager [phone]  Patient Details  Name: [name] MRN: [id number] Date of Birth: 11/30/1924  Subjective/Objective:       Pt admitted with sepsis, HCAP             Action/Plan: PTA Pt was from [facility]- CSW consulted for placement needs  Expected Discharge Date:                  Expected Discharge Plan:  Skilled Nursing Facility  In-House Referral:  Clinical Social Work  Discharge planning Services  CM Consult  Post Acute Care Choice:    Choice offered to:     DME Arranged:    DME Agency:     HH Arranged:    HH Agency:     Status of Service:  In process, will continue to follow  Medicare Important Message Given:    Date Medicare IM Given:    Medicare IM give by:    Date Additional Medicare IM Given:    Additional Medicare Important Message give by:     If discussed at Long Length of Stay Meetings, dates discussed:    Additional Comments:  Darrold Span, RN 09/30/2014, 11:47 AM

## 2014-09-30 NOTE — Progress Notes (Signed)
Utilization review completed.  

## 2014-10-01 ENCOUNTER — Inpatient Hospital Stay (HOSPITAL_COMMUNITY): Payer: Medicare Other

## 2014-10-01 LAB — MAGNESIUM: MAGNESIUM: 1.6 mg/dL — AB (ref 1.7–2.4)

## 2014-10-01 LAB — GLUCOSE, CAPILLARY: GLUCOSE-CAPILLARY: 143 mg/dL — AB (ref 65–99)

## 2014-10-01 MED ORDER — RESOURCE THICKENUP CLEAR PO POWD
ORAL | Status: DC | PRN
  Filled 2014-10-01: qty 125

## 2014-10-01 NOTE — Progress Notes (Signed)
Pt has positive blood cultures per lab, MD [facility]ega notified, day shift RN made aware.   Claudie Revering, RN

## 2014-10-01 NOTE — Progress Notes (Signed)
OT Cancellation Note  Patient Details Name: [name] MRN: [id number] DOB: 1924/07/02   Cancelled Treatment:    Reason Eval/Treat Not Completed: Patient declined, no reason specified  Earlie Raveling OTR/L [phone]  10-14-14, 4:32 PM

## 2014-10-01 NOTE — Progress Notes (Signed)
Speech Pathology   MBSS complete. Full report located under chart review in imaging section. Double click DG swallow function. Recommend: Dys 1 texture and honey thick liquids    Breck Coons [place].Ed [employer] [phone]

## 2014-10-01 NOTE — Evaluation (Signed)
Clinical/Bedside Swallow Evaluation Patient Details  Name: [name] MRN: [id number] Date of Birth: 08/15/1924  Today's Date: 10/03/2014 Time: SLP Start Time (ACUTE ONLY): 1059 SLP Stop Time (ACUTE ONLY): 1121 SLP Time Calculation (min) (ACUTE ONLY): 22 min  Past Medical History:  Past Medical History  Diagnosis Date  . Coronary artery disease 2004  . Stroke   . Benign prostatic hypertrophy   . Colitis 12/16/2011  . Pneumonia 02/03/2013  . DIVERTICULOSIS, COLON 06/17/2006  . Aortic stenosis   . PVD (peripheral vascular disease)     subclavian stent (per Dr. Judd Gaudier note)  . Carotid stenosis   . Hypertension   . Renal disorder     Stage III  . CHF (congestive heart failure)   . NSTEMI (non-ST elevated myocardial infarction)    Past Surgical History:  Past Surgical History  Procedure Laterality Date  . Appendectomy    . Coronary artery bypass graft  2004    LIMA-dLAD, SVG-OM1-OM2, SVG-AM  . Hemorrhoid surgery    . Knee arthroscopy    . Elbow surgery    . Cataract extraction      x2  . Carotid endarterectomy Left 08-11-04    cea  . Carotid endarterectomy Right 10-08-04    cea   HPI:  81 year old male with h/o PVD, CAD, chronic systolic and diastolic heart failure, hypertension, carotid stenosis, h/o CVA, pna renal disordera resident of nursing home, recently discharged after being treated for NSTEMI , sepsis and UTI. Per MD note pt admitted after fall, and worsening confusion and hallucinations. Per MD note CXR showed superimposed atelectasis and infiltrates and UA was concerning for urinary tract infection.    Assessment / Plan / Recommendation Clinical Impression  Swallow assessed wit daughter present who affirms (with pt) pt reports of "going down the wrong pipe" in recent past. Pt crying throughout assessment requiring cues to attend to SLP/task. Vocal quality significantly wet. Oral dyscoordination, decreased labial seal and apraxic-like movements. Delayed cough, wet  vocal quality, decreased laryngeal elevation, audible swallow and pna warrant objective evaluation. MBS scheduled today at 1300.    Aspiration Risk  Severe    Diet Recommendation  (hold tray until after MBS)        Other  Recommendations Oral Care Recommendations: Oral care QID   Follow Up Recommendations       Frequency and Duration        Pertinent Vitals/Pain Pt vomited small amount prior to assessment, RN present    SLP Swallow Goals     Swallow Study Prior Functional Status       General Other Pertinent Information: 81 year old male with h/o PVD, CAD, chronic systolic and diastolic heart failure, hypertension, carotid stenosis, h/o CVA, pna renal disordera resident of nursing home, recently discharged after being treated for NSTEMI , sepsis and UTI. Per MD note pt admitted after fall, and worsening confusion and hallucinations. Per MD note CXR showed superimposed atelectasis and infiltrates and UA was concerning for urinary tract infection.  Type of Study: Bedside swallow evaluation Previous Swallow Assessment:  (none founnd) Diet Prior to this Study: Regular;Thin liquids Temperature Spikes Noted: No Respiratory Status: Supplemental O2 delivered via (comment) History of Recent Intubation: No Behavior/Cognition: Alert;Requires cueing Oral Cavity - Dentition:  (upper denture plate, lower any missing?) Self-Feeding Abilities: Able to feed self;Needs set up;Needs assist Patient Positioning: Upright in bed Baseline Vocal Quality: Wet Volitional Cough: Strong Volitional Swallow: Able to elicit    Oral/Motor/Sensory Function Overall Oral  Motor/Sensory Function:  (no focal weakness )   Ice Chips Ice chips: Impaired Presentation: Spoon Oral Phase Impairments: Reduced labial seal;Reduced lingual movement/coordination;Impaired anterior to posterior transit;Poor awareness of bolus Oral Phase Functional Implications: Prolonged oral transit Pharyngeal Phase Impairments: Wet  Vocal Quality   Thin Liquid Thin Liquid: Impaired Presentation: Cup Oral Phase Impairments: Reduced labial seal;Reduced lingual movement/coordination Oral Phase Functional Implications: Right anterior spillage;Prolonged oral transit Pharyngeal  Phase Impairments: Cough - Delayed;Suspected delayed Swallow;Wet Vocal Quality (audible swallow)    Nectar Thick Nectar Thick Liquid: Not tested   Honey Thick Honey Thick Liquid: Not tested   Puree Puree: Impaired Oral Phase Impairments: Reduced labial seal;Reduced lingual movement/coordination Pharyngeal Phase Impairments: Wet Vocal Quality   Solid   GO    Solid: Not tested       [name] 09/30/2014,11:34 AM  [name] [name].Ed [employer] [phone]

## 2014-10-01 NOTE — Clinical Social Work Note (Signed)
Clinical Social Work Assessment  Patient Details  Name: [name] MRN: [id number] Date of Birth: 09/14/1924  Date of referral:  09/30/2014               Reason for consult:  Facility Placement                Permission sought to share information with:  Family Supports Permission granted to share information::  No (pt disoriented)  Name::     [name]  Agency::  [facility]  Relationship::  daughter  Solicitor Information:     Housing/Transportation Living arrangements for the past 2 months:  Skilled Building surveyor of Information:  Adult Children Patient Interpreter Needed:  None Criminal Activity/Legal Involvement Pertinent to Current Situation/Hospitalization:  No - Comment as needed Significant Relationships:  Adult Children Lives with:  Self Do you feel safe going back to the place where you live?  No Need for family participation in patient care:  Yes (Comment)  Care giving concerns:  Pt was living at home alone prior to transfer to [facility] and pt daughter does not think pt will be able to return home after this admission due to physical impairment   Office manager / plan:  CSW spoke with pt daughter about PT recommendation for pt to return to SNF  Employment status:  Retired Health and safety inspector:  Medicare PT Recommendations:  Skilled Nursing Facility Information / Referral to community resources:  Skilled Nursing Facility  Patient/Family's Response to care:  Pt daughter is agreeable to pt return to [place] at time of DC- would prefer if pt can return home but understands that pt will need further rehab  Patient/Family's Understanding of and Emotional Response to Diagnosis, Current Treatment, and Prognosis:  No questions or concerns at this time  Emotional Assessment Appearance:  Appears stated age Attitude/Demeanor/Rapport:  Unable to Assess Affect (typically observed):  Unable to Assess Orientation:  Oriented to Self, Oriented to Place,  Oriented to  Time, Oriented to Situation Alcohol / Substance use:  Not Applicable Psych involvement (Current and /or in the community):  No (Comment)  Discharge Needs  Concerns to be addressed:  Care Coordination Readmission within the last 30 days:  Yes Current discharge risk:  Physical Impairment Barriers to Discharge:  Continued Medical Work up   [name] Ribas, LCSW 10/05/2014, 4:29 PM

## 2014-10-01 NOTE — Progress Notes (Signed)
[facility] PROGRESS NOTE  [name] OZH:[id number] DOB: August 02, 1924 DOA: 09/28/2014 PCP: Tana Conch, MD  Brief Narrative: Patient is an 81 year old who presented with sepsis and metabolic encephalopathy secondary to infectious etiology. P.m. treated for bacteremia, HCAP, and UTI  Assessment/Plan: Principal Problem:   Acute delirium/metabolic encephalopathy - Most likely secondary to infectious etiology    Acute respiratory failure with hypoxia/HCAP (healthcare-associated pneumonia) - Currently patient on broad-spectrum antibiotics regimen. We'll continue - Continue supportive therapy  Active Problems: Bacteremia - One out of 2 bottles growing gram-positive cocci coagulase negative staph species. Patient currently on vancomycin and Zosyn. We'll continue current antibiotics regimen. May represent contamination. Will await results from other blood culture  UTI - Urinalysis suspicious for UTI. But urine culture reports no growth - We will continue patient on broad-spectrum antibiotics at this point.  Elevated troponin -Patient is not candidate for aggressive cardiology workup. We'll continue medical management - Troponin trending down on last check - Continue aspirin    QT prolongation - We'll avoid any medications that prolong QT interval    Dehydration, severe - Improving after IV fluid rehydration   Code Status: DO NOT RESUSCITATE Family Communication: No family at bedside  Disposition Plan: Pending continued improvement in condition   Consultants:  None  Procedures:  None  Antibiotics:  Vancomycin and Zosyn  HPI/Subjective: Patient has no new complaints. He is more responsive and alert. No acute issues reported to me overnight.  Objective: Filed Vitals:   10/06/2014 1433  BP: 107/81  Pulse: 98  Temp: 99 F (37.2 C)  Resp: 20    Intake/Output Summary (Last 24 hours) at 09/13/2014 1713 Last data filed at 10/07/2014 0800  Gross per 24 hour   Intake     30 ml  Output    225 ml  Net   -195 ml   Filed Weights   09/28/2014 1403 09/30/2014 0538  Weight: 77.111 kg (170 lb) 81.285 kg (179 lb 3.2 oz)    Exam:   General:  Patient in no acute distress, alert and awake  Cardiovascular: Regular rate and rhythm, no murmurs or rubs  Respiratory: No wheezes, equal chest rise, mild rales  Abdomen: Soft, nondistended, no guarding  Musculoskeletal: Equal tone  Data Reviewed: Basic Metabolic Panel:  Recent Labs Lab 10/07/2014 1355 10/11/2014 2014 09/30/14 0819 09/26/2014 0330  NA 143  --  147*  --   K 3.3*  --  3.7  --   CL 101  --  110  --   CO2 30  --  28  --   GLUCOSE 114*  --  145*  --   BUN 36*  --  28*  --   CREATININE 2.30*  --  1.99*  --   CALCIUM 8.5*  --  8.3*  --   MG  --  1.7  --  1.6*   Liver Function Tests:  Recent Labs Lab 09/21/2014 1355 09/30/14 0819  AST 42* 38  ALT 21 20  ALKPHOS 73 68  BILITOT 1.3* 1.2  PROT 6.2* 6.0*  ALBUMIN 2.9* 2.7*   No results for input(s): LIPASE, AMYLASE in the last 168 hours. No results for input(s): AMMONIA in the last 168 hours. CBC:  Recent Labs Lab 09/20/2014 1355 09/30/14 0819  WBC 8.6 7.4  NEUTROABS 5.8 5.0  HGB 11.3* 11.1*  HCT 36.6* 36.6*  MCV 99.5 100.5*  PLT 300 248   Cardiac Enzymes:  Recent Labs Lab 09/23/2014 1744 09/12/2014 2014 09/30/14 0200 09/30/14 6295  CKTOTAL 208  --   --   --   TROPONINI  --  2.06* 2.04* 1.68*   BNP (last 3 results) No results for input(s): BNP in the last 8760 hours.  ProBNP (last 3 results) No results for input(s): PROBNP in the last 8760 hours.  CBG: No results for input(s): GLUCAP in the last 168 hours.  Recent Results (from the past 240 hour(s))  Blood Culture (routine x 2)     Status: None (Preliminary result)   Collection Time: 2014-10-28  1:55 PM  Result Value Ref Range Status   Specimen Description BLOOD LEFT ANTECUBITAL  Final   Special Requests BOTTLES DRAWN AEROBIC AND ANAEROBIC  Final   Culture   Setup Time   Final    GRAM POSITIVE COCCI IN CLUSTERS IN BOTH AEROBIC AND ANAEROBIC BOTTLES CRITICAL RESULT CALLED TO, READ BACK BY AND VERIFIED WITH: K WEST 09/30/14 @ 1248 M VESTAL GRAM VARIABLE ROD ANAEROBIC BOTTLE ONLY    Culture   Final    STAPHYLOCOCCUS SPECIES (COAGULASE NEGATIVE) THE SIGNIFICANCE OF ISOLATING THIS ORGANISM FROM A SINGLE SET OF BLOOD CULTURES WHEN MULTIPLE SETS ARE DRAWN IS UNCERTAIN. PLEASE NOTIFY THE MICROBIOLOGY DEPARTMENT WITHIN ONE WEEK IF SPECIATION AND SENSITIVITIES ARE REQUIRED.    Report Status PENDING  Incomplete  Urine culture     Status: None   Collection Time: 10-28-2014  2:20 PM  Result Value Ref Range Status   Specimen Description URINE, CATHETERIZED  Final   Special Requests NONE  Final   Culture NO GROWTH 1 DAY  Final   Report Status 09/30/2014 FINAL  Final  Blood Culture (routine x 2)     Status: None (Preliminary result)   Collection Time: Oct 28, 2014  2:38 PM  Result Value Ref Range Status   Specimen Description BLOOD RIGHT ANTECUBITAL  Final   Special Requests BOTTLES DRAWN AEROBIC AND ANAEROBIC  Final   Culture NO GROWTH 2 DAYS  Final   Report Status PENDING  Incomplete     Studies: Dg Chest 2 View  09/30/2014   CLINICAL DATA:  Subsequent encounter for healthcare associated pneumonia.  EXAM: CHEST  2 VIEW  COMPARISON:  October 28, 2014.  FINDINGS: Two-view exam shows interval increase in interstitial opacity with a basilar predominance. Bibasilar atelectasis or infection has also progressed in the interval. The cardio pericardial silhouette is enlarged. Bones are diffusely demineralized. Degenerative changes are noted in both shoulders. Patient is status post CABG. Telemetry leads overlie the chest.  IMPRESSION: Cardiomegaly with increasing interstitial opacity suggesting progressive pulmonary edema.  Worsening bibasilar atelectasis or pneumonia.   Electronically Signed   By: Kennith Center M.D.   On: 09/30/2014 07:52   Dg Swallowing Func-speech  Pathology  09/25/2014    Objective Swallowing Evaluation:   Modified Barium Swallow Patient Details  Name: [name] MRN: [id number] Date of Birth: 1924-08-03  Today's Date: 09/25/2014 Time: SLP Start Time (ACUTE ONLY): 1337-SLP Stop Time (ACUTE ONLY): 1350 SLP Time Calculation (min) (ACUTE ONLY): 13 min  Past Medical History:  Past Medical History  Diagnosis Date  . Coronary artery disease 2004  . Stroke   . Benign prostatic hypertrophy   . Colitis 12/16/2011  . Pneumonia 02/03/2013  . DIVERTICULOSIS, COLON 06/17/2006  . Aortic stenosis   . PVD (peripheral vascular disease)     subclavian stent (per Dr. Judd Gaudier note)  . Carotid stenosis   . Hypertension   . Renal disorder     Stage III  . CHF (congestive heart  failure)   . NSTEMI (non-ST elevated myocardial infarction)    Past Surgical History:  Past Surgical History  Procedure Laterality Date  . Appendectomy    . Coronary artery bypass graft  2004    LIMA-dLAD, SVG-OM1-OM2, SVG-AM  . Hemorrhoid surgery    . Knee arthroscopy    . Elbow surgery    . Cataract extraction      x2  . Carotid endarterectomy Left 08-11-04    cea  . Carotid endarterectomy Right 10-08-04    cea   HPI:  Other Pertinent Information: 81 year old male with h/o PVD, CAD, chronic  systolic and diastolic heart failure, hypertension, carotid stenosis, h/o  CVA, pna renal disordera resident of nursing home, recently discharged  after being treated for NSTEMI , sepsis and UTI. Per MD note pt admitted  after fall, and worsening confusion and hallucinations. Per MD note CXR  showed superimposed atelectasis and infiltrates and UA was concerning for  urinary tract infection.   No Data Recorded  Assessment / Plan / Recommendation CHL IP CLINICAL IMPRESSIONS 10-27-14  Therapy Diagnosis Mild pharyngeal phase dysphagia;Moderate oral phase  dysphagia;Severe oral phase dysphagia  Clinical Impression Pt required encouragement to attend to po consumption  during study with pt intermittently crying. Decreased labial  seal, lingual  manipulation and cohesion with oral reside resulted in moderate-severe  oral dysphagia. Mild pharyngeal dysphagia with delayed swallow initiation  to valleculae and pyriforms due to decreased sensation and min-mild  vallecular residue. Airway compromise not observed during study however  vocal quality significantly wet following po's, vocalization throughout,  poor sustained attention all leading to significantly increased aspiration  risk. Recommend Dys 1 texture and honey thick liquids (hopeful to  downgrade soon), crush meds, limit distractions, small bites/sips,  volitional cough intermittently and full supervision and assist.        CHL IP TREATMENT RECOMMENDATION October 27, 2014  Treatment Recommendations Therapy as outlined in treatment plan below     CHL IP DIET RECOMMENDATION 10-27-2014  SLP Diet Recommendations Dysphagia 1 (Puree);Honey  Liquid Administration via (None)  Medication Administration Crushed with puree  Compensations Slow rate;Small sips/bites;Check for pocketing  Postural Changes and/or Swallow Maneuvers (None)     CHL IP OTHER RECOMMENDATIONS 10-27-14  Recommended Consults (None)  Oral Care Recommendations Oral care QID  Other Recommendations (None)     No flowsheet data found.   CHL IP FREQUENCY AND DURATION 10-27-2014  Speech Therapy Frequency (ACUTE ONLY) min 2x/week  Treatment Duration 2 weeks     Pertinent Vitals/Pa     SLP Swallow Goals No flowsheet data found.  No flowsheet data found.    CHL IP REASON FOR REFERRAL 2014/10/27  Reason for Referral Objectively evaluate swallowing function               No flowsheet data found.         Royce Macadamia 2014-10-27, 2:26 PM  Breck Coons [place].Ed CCC-SLP Pager [phone]     Scheduled Meds: . aspirin EC  81 mg Oral Daily  . enoxaparin (LOVENOX) injection  30 mg Subcutaneous Q24H  . feeding supplement  1 Container Oral TID BM  . feeding supplement (ENSURE ENLIVE)  237 mL Oral BID BM  . finasteride  5 mg Oral Daily  .  magnesium oxide  200 mg Oral BID  . piperacillin-tazobactam (ZOSYN)  IV  3.375 g Intravenous 3 times per day  . tamsulosin  0.4 mg Oral Daily  . vancomycin  1,000 mg Intravenous  Q48H   Continuous Infusions: . dextrose 5 % and 0.9 % NaCl with KCl 20 mEq/L 100 mL (2014-10-10 1154)     Time spent: > 35 minutes    Penny Pia  [facility] Pager [phone] If 7PM-7AM, please contact night-coverage at [email or web address], password [facility] 2014-10-10, 5:13 PM  LOS: 2 days

## 2014-10-03 LAB — CULTURE, BLOOD (ROUTINE X 2)

## 2014-10-04 LAB — CULTURE, BLOOD (ROUTINE X 2): CULTURE: NO GROWTH

## 2014-10-04 LAB — HIV 1/2 AB DIFFERENTIATION
HIV 1 Ab: NEGATIVE
HIV 2 AB: UNDETERMINED

## 2014-10-04 LAB — RNA QUALITATIVE

## 2014-10-04 LAB — HIV ANTIBODY (ROUTINE TESTING W REFLEX)

## 2014-10-09 ENCOUNTER — Ambulatory Visit: Payer: Medicare Other | Admitting: Cardiology

## 2014-10-12 NOTE — Progress Notes (Signed)
Was notified by CCMD that pt's HR dropped down to 37. Went to check on pt and pt was unresponsive. Pt DNR. With Otilio Connors RN, checked for heart sounds, lung sounds, and respirations. Pulse and respirations were not present. Eyes were fixated. Notified Maren Reamer NP and gave [place] order to pronounce death of pt. Time of death 10-11-2014 at [phone]-[phone]. Next of kin (daughter, [name]) notified. Daughter came to see pt and was offered a chaplin. Pt denied and also denied autopsy. No pt belongings at the bedside. Will properly prepare pt for morgue.   Shamarra Warda, RN

## 2014-10-12 NOTE — Progress Notes (Signed)
Notified the medical examiner of pt's death and Volney Presser decided the pt is not a case. [place] donor services notified and informed by Barbette Or that pt is not a candidate to be a donor. Reference number [id number]. Donor sheet placed in pt's chart.  Donson,Desiree, RN

## 2014-10-12 NOTE — Discharge Summary (Signed)
Death Summary  [name] WUJ:[id number] DOB: Sep 03, 1924 DOA: October 20, 2014  PCP: Tana Conch, MD   Admit date: 10-20-2014 Date of Death: 10/31/2014  Final Diagnoses:  Principal Problem:   Acute delirium Active Problems:   Hyperlipidemia   Anemia   Essential hypertension   Acute renal failure superimposed on stage 3 chronic kidney disease   BPH (benign prostatic hyperplasia)   Severe aortic stenosis   Cardiomyopathy, ischemic   PVD (peripheral vascular disease)   UTI (lower urinary tract infection)   Chronic combined systolic and diastolic congestive heart failure   FTT (failure to thrive) in adult   QT prolongation   Elevated troponin   Dehydration, severe   Acute respiratory failure with hypoxia   HCAP (healthcare-associated pneumonia)    History of present illness/Hospital Course:  Patient is an 81 year old who presented with sepsis and metabolic encephalopathy secondary to infectious etiology. P.m. treated for bacteremia, HCAP, and UTI  Despite aggressive measures patient passed away 10/23/14. Please refer to prior PN regarding active treatment regimen.    Signed:  Penny Pia  [facility] 10-31-14, 3:04 PM

## 2014-10-12 NOTE — Progress Notes (Signed)
Pt expired at 2330 09/17/2014. Pt was a DNR. Pronounced dead by 2 RNs. Death certificate completed and given to unit secretary. Family coming to hospital.  Jimmye Norman, NP [facility]

## 2014-10-12 NOTE — Progress Notes (Signed)
Called to patient's room by patient's RN Anson Crofts to evaluate patient for signs of end of life. Found patient to have no visible chest rise and no audible breath sounds. Patient had no cartotid or radial pulses and no audible heart sounds. Patient was unresponsive to sound or touch and pupils were fixed and nonreactive to light. RN pronouncement of death approved due to patient being DNR  by Maren Reamer NP who was on call for patient's attending MD. Time of death 23:30 on October 03, 2014.

## 2014-10-12 DEATH — deceased

## 2015-01-07 ENCOUNTER — Other Ambulatory Visit (HOSPITAL_COMMUNITY): Payer: Medicare Other

## 2015-01-07 ENCOUNTER — Ambulatory Visit: Payer: Medicare Other | Admitting: Family

## 2016-12-26 IMAGING — MR MR CERVICAL SPINE W/O CM
4 of 5 series · 19 of 48 positions shown · non-contrast
Comparison: Plain film cervical spine 04/26/2010.

CLINICAL DATA: Neck pain, worse on the right. Bilateral arm pain
and shoulder soreness. Symptoms for 2 years.

EXAM:
MRI CERVICAL SPINE WITHOUT CONTRAST
TECHNIQUE: Multiplanar, multisequence MR imaging of the cervical spine was
performed. No intravenous contrast was administered.

[Series 2: T2 · sagittal · 3.0mm · 0.39mm/px · 6 of 12 slices shown (1 of 2)]
[im 1/12]
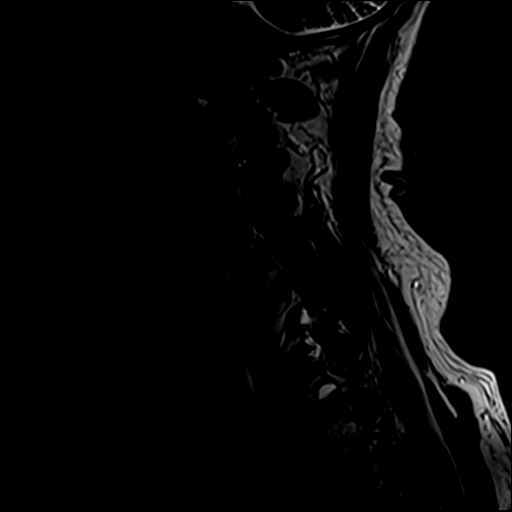
[im 3/12]
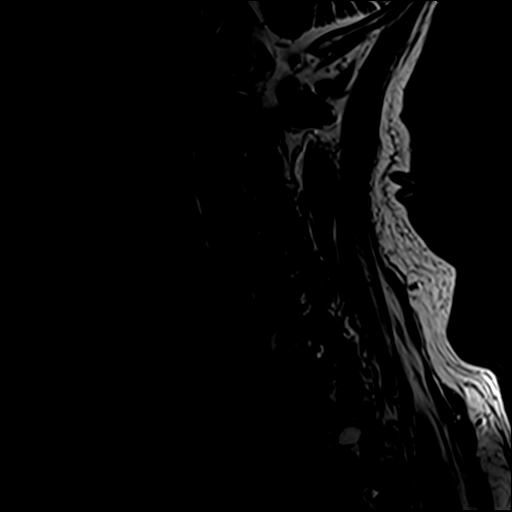
[im 5/12]
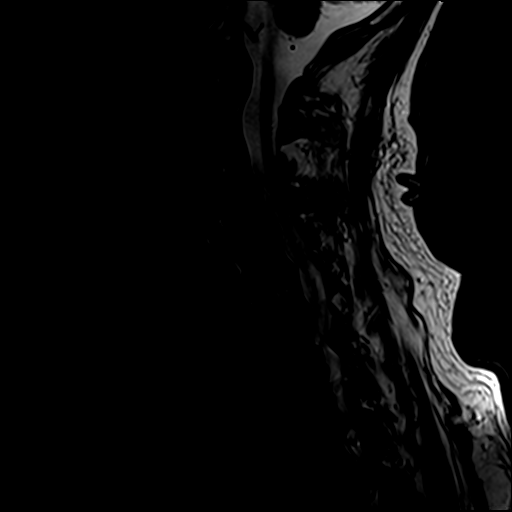
[im 7/12]
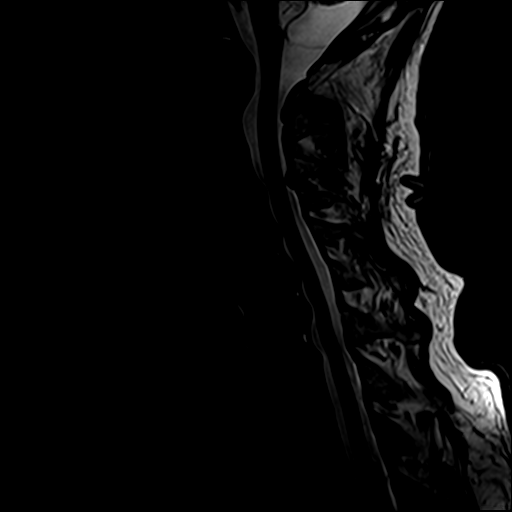
[im 9/12]
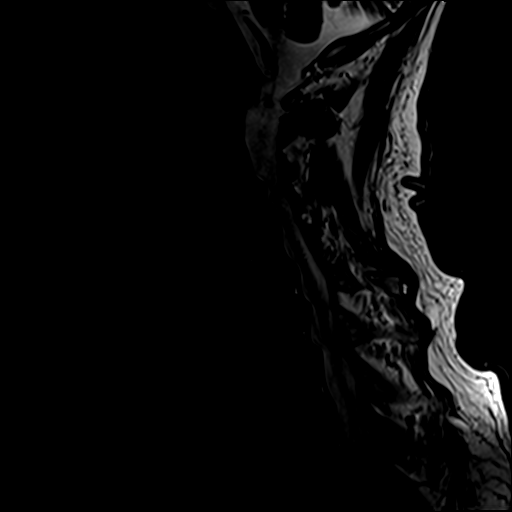
[im 12/12]
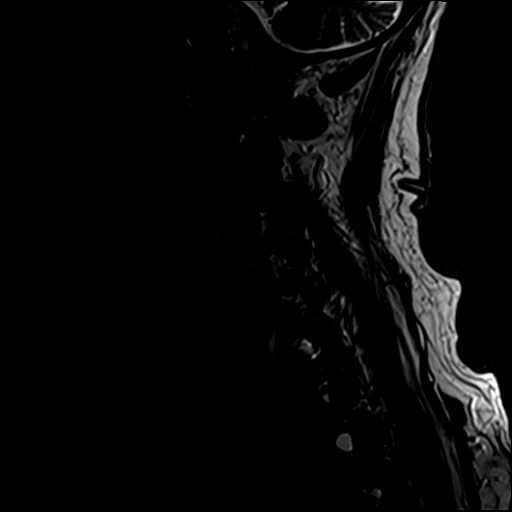

[Series 3: T1 · sagittal · 3.0mm · 0.39mm/px · 3 of 12 slices shown]
[im 3/12]
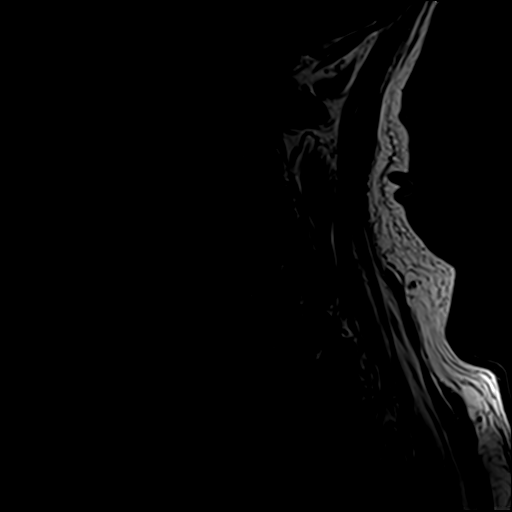
[im 7/12]
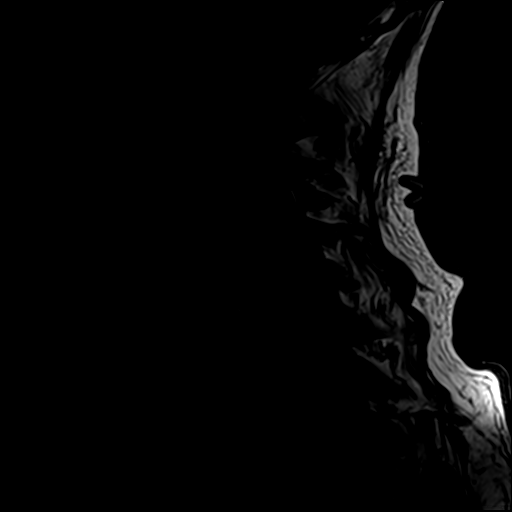
[im 12/12]
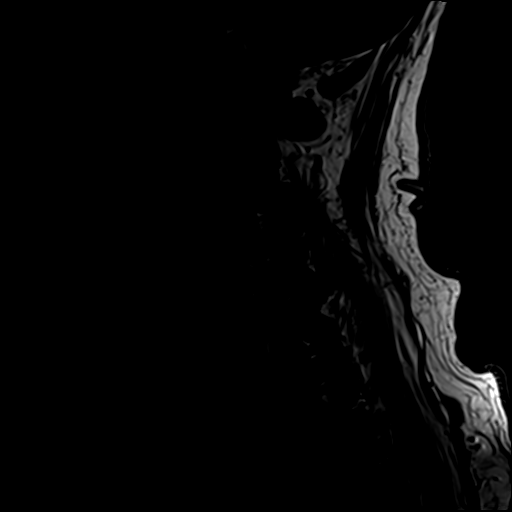

[Series 4: T2 · axial · 3.0mm · 0.33mm/px · z∈[-25,+62]mm · 7 of 28 slices shown (2 of 2)]
[im 1/28]
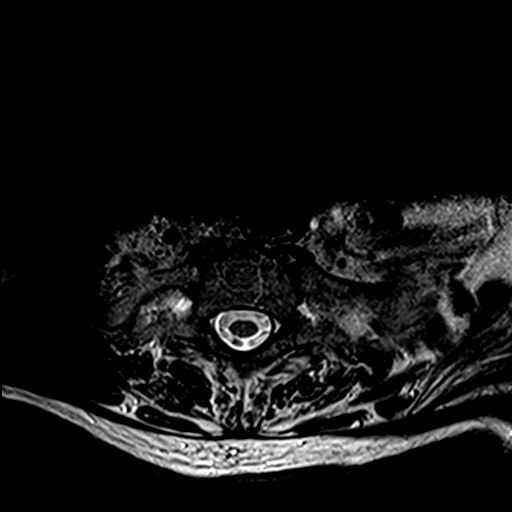
[im 4/28]
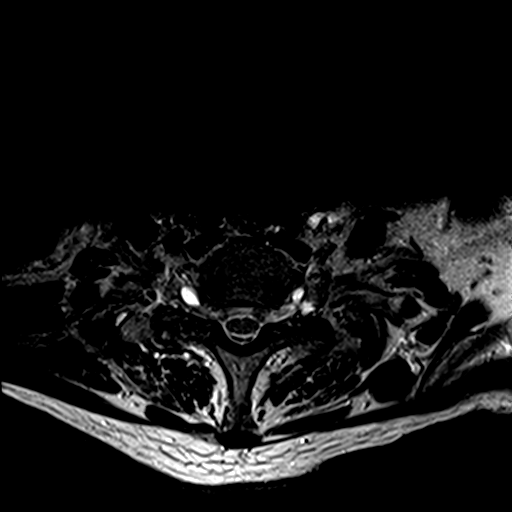
[im 8/28]
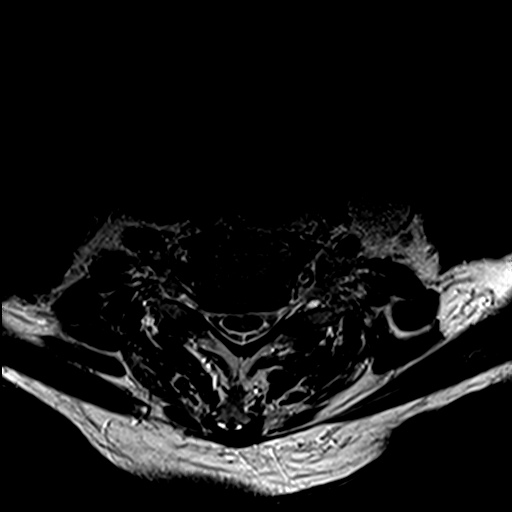
[im 12/28]
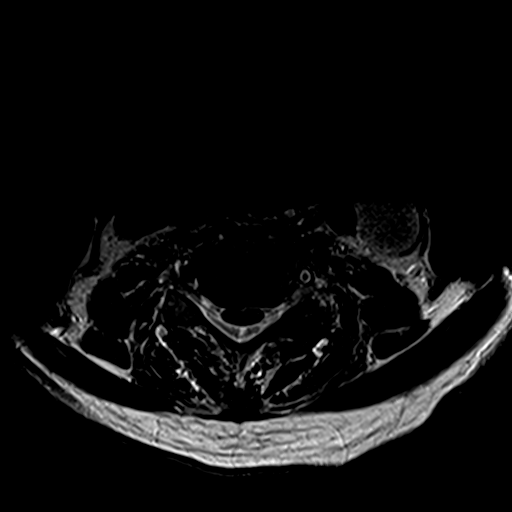
[im 14/28]
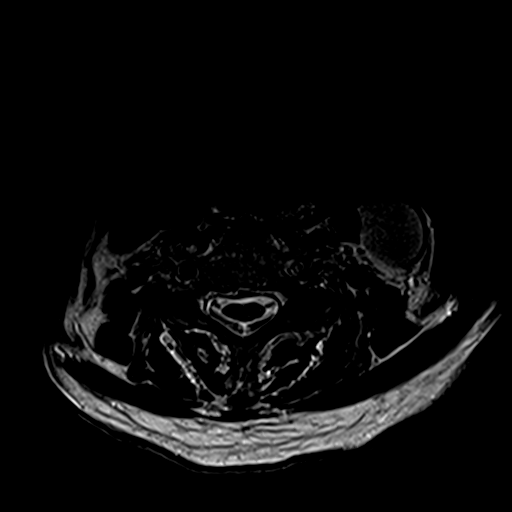
[im 16/28]
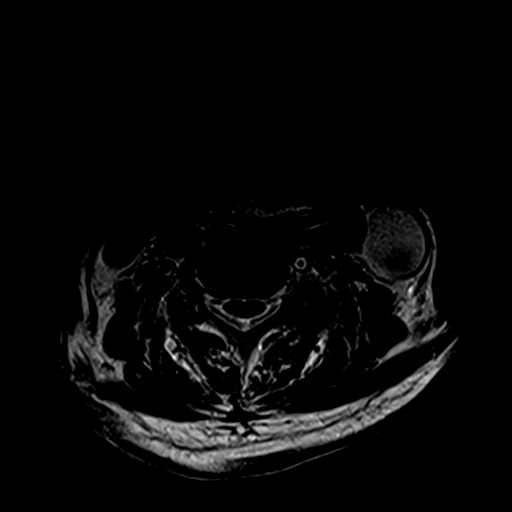
[im 24/28]
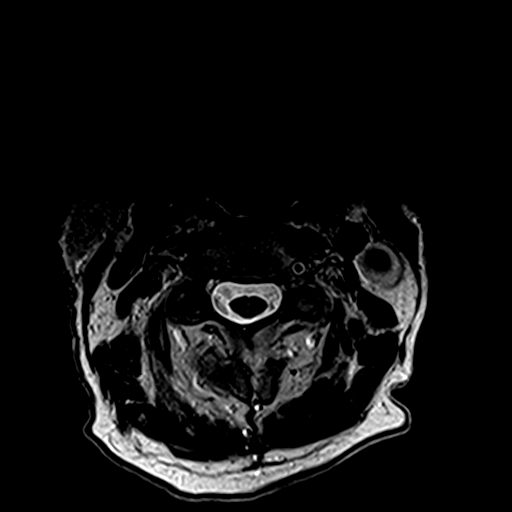

[Series 5: STIR · sagittal · 3.0mm · 0.39mm/px · 3 of 12 slices shown]
[im 3/12]
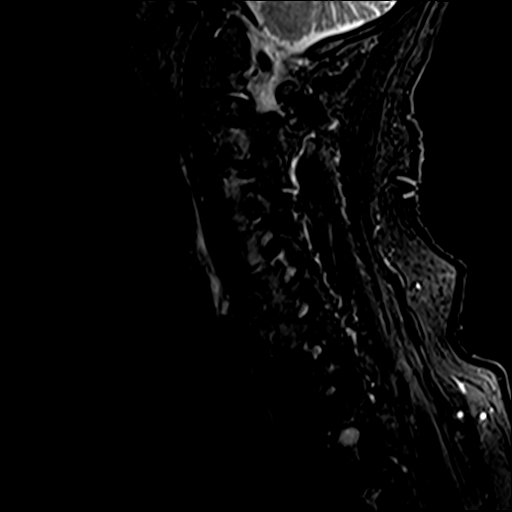
[im 7/12]
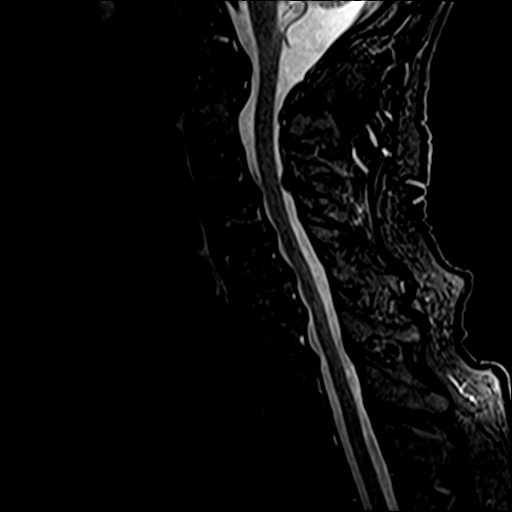
[im 12/12]
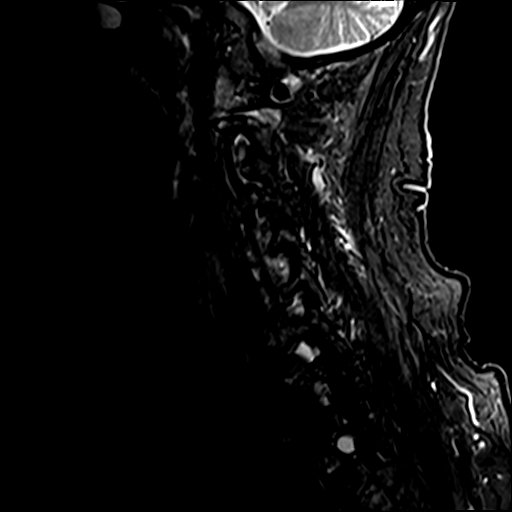

[19 of 48 positions shown; findings below may reference images not displayed]

FINDINGS: Trace anterolisthesis C5 on C6 is due to facet arthropathy.
Straightening of the normal cervical lordosis is noted. Vertebral
body height and marrow signal are unremarkable. The craniocervical
junction is normal and cervical cord signal is normal. Imaged
paraspinous structures are unremarkable.

C2-3: Facet degenerative disease. No disc bulge or protrusion. The
central canal and foramina are widely patent.

C3-4: There is a shallow disc bulge and uncovertebral disease. Mild
central canal and bilateral foraminal narrowing is identified. Facet
arthropathy noted.

C4-5: Small disc osteophyte complex and uncovertebral disease. Left
worse than right facet degenerative change is seen. Mild to moderate
foraminal narrowing is more notable on the left. The central canal
is open.

C5-6: There is a shallow disc bulge and small right paracentral
protrusion. Disc contacts the ventral cord but the central canal and
foramina appear widely patent.

C6-7: Shallow disc osteophyte complex and mild uncovertebral
disease. The central canal and foramina are open.

C7-T1: Shallow disc bulge is identified and there is facet
degenerative change. The central canal and foramina appear open.
IMPRESSION: Overall mild spondylosis of the cervical spine as detailed above
most notable at C3-4 where the central canal and foramina are mildly
narrowed.
